# Patient Record
Sex: Female | Born: 1995 | Race: Black or African American | Hispanic: No | Marital: Single
Health system: Southern US, Community
[De-identification: ages and names within clinical notes are randomized; demographics above are authoritative.]

## PROBLEM LIST (undated history)

## (undated) ENCOUNTER — Inpatient Hospital Stay (HOSPITAL_COMMUNITY): Payer: Self-pay

## (undated) DIAGNOSIS — F32A Depression, unspecified: Secondary | ICD-10-CM

## (undated) DIAGNOSIS — F329 Major depressive disorder, single episode, unspecified: Secondary | ICD-10-CM

## (undated) DIAGNOSIS — O09299 Supervision of pregnancy with other poor reproductive or obstetric history, unspecified trimester: Secondary | ICD-10-CM

## (undated) DIAGNOSIS — F419 Anxiety disorder, unspecified: Secondary | ICD-10-CM

## (undated) HISTORY — PX: NO PAST SURGERIES: SHX2092

## (undated) HISTORY — PX: WISDOM TOOTH EXTRACTION: SHX21

---
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
## 2017-01-08 ENCOUNTER — Encounter: Payer: Self-pay | Admitting: Family Medicine

## 2017-01-10 ENCOUNTER — Encounter: Payer: Self-pay | Admitting: Family Medicine

## 2017-01-26 ENCOUNTER — Encounter: Payer: Self-pay | Admitting: Family Medicine

## 2017-01-28 ENCOUNTER — Encounter: Payer: Self-pay | Admitting: Family Medicine

## 2017-02-07 ENCOUNTER — Encounter: Payer: Self-pay | Admitting: Family Medicine

## 2017-02-08 ENCOUNTER — Ambulatory Visit (INDEPENDENT_AMBULATORY_CARE_PROVIDER_SITE_OTHER): Payer: Medicaid Other | Admitting: *Deleted

## 2017-02-08 ENCOUNTER — Encounter: Payer: Self-pay | Admitting: Family Medicine

## 2017-02-08 DIAGNOSIS — R319 Hematuria, unspecified: Secondary | ICD-10-CM

## 2017-02-08 LAB — POCT URINALYSIS DIP (DEVICE)
BILIRUBIN URINE: NEGATIVE
Glucose, UA: NEGATIVE mg/dL
KETONES UR: NEGATIVE mg/dL
Nitrite: NEGATIVE
Protein, ur: 100 mg/dL — AB
Specific Gravity, Urine: >=1.03 (ref 1.005–1.030)
Urobilinogen, UA: 0.2 mg/dL (ref 0.0–1.0)
pH: 6.5 (ref 5.0–8.0)

## 2017-02-08 NOTE — Progress Notes (Signed)
Beth Bray here today to have urine checked for uti. Has been sending/receiving mychart messages with our office. She states she does not have pain or burning with urination. States she just notices spots of blood when she wipes. We discussed since she got her first dose of depo-provera in April - that can cause spotting/ irregular bleeding that may improve after her 2nd or 3rd shot.  We discussed if her bleeding does not get better after 2nd or 3rd shot may call us back. We also discussed uti would not cause her to have vaginal bleeding- she denies seeing blood in urine. We discussed I will send her urine for culture since it did show leukocytes, blood, and protein. We will call us next week if she has a uti and tell her what to take. Will not prescribe meds since she does not have symptoms .

## 2017-02-09 ENCOUNTER — Encounter: Payer: Self-pay | Admitting: Family Medicine

## 2017-02-10 ENCOUNTER — Encounter: Payer: Self-pay | Admitting: Family Medicine

## 2017-02-10 ENCOUNTER — Encounter (HOSPITAL_COMMUNITY): Payer: Self-pay | Admitting: Oncology

## 2017-02-10 ENCOUNTER — Emergency Department (HOSPITAL_COMMUNITY)
Admission: EM | Admit: 2017-02-10 | Discharge: 2017-02-11 | Disposition: A | Discharge status: Home / Self Care | Payer: Medicaid Other | Attending: Emergency Medicine | Admitting: Emergency Medicine

## 2017-02-10 DIAGNOSIS — N10 Acute tubulo-interstitial nephritis: Secondary | ICD-10-CM | POA: Insufficient documentation

## 2017-02-10 DIAGNOSIS — R102 Pelvic and perineal pain: Secondary | ICD-10-CM | POA: Diagnosis present

## 2017-02-10 DIAGNOSIS — Z7722 Contact with and (suspected) exposure to environmental tobacco smoke (acute) (chronic): Secondary | ICD-10-CM | POA: Insufficient documentation

## 2017-02-10 DIAGNOSIS — J45909 Unspecified asthma, uncomplicated: Secondary | ICD-10-CM | POA: Insufficient documentation

## 2017-02-10 DIAGNOSIS — N12 Tubulo-interstitial nephritis, not specified as acute or chronic: Secondary | ICD-10-CM

## 2017-02-10 LAB — URINALYSIS, ROUTINE W REFLEX MICROSCOPIC
BILIRUBIN URINE: NEGATIVE
GLUCOSE, UA: NEGATIVE mg/dL
KETONES UR: NEGATIVE mg/dL
Nitrite: NEGATIVE
PROTEIN: 100 mg/dL — AB
Specific Gravity, Urine: 1.02 (ref 1.005–1.030)
pH: 5 (ref 5.0–8.0)

## 2017-02-10 LAB — CBC
HEMATOCRIT: 38.1 % (ref 36.0–46.0)
Hemoglobin: 12.1 g/dL (ref 12.0–15.0)
MCH: 25.1 pg — AB (ref 26.0–34.0)
MCHC: 31.8 g/dL (ref 30.0–36.0)
MCV: 79 fL (ref 78.0–100.0)
PLATELETS: 277 10*3/uL (ref 150–400)
RBC: 4.82 MIL/uL (ref 3.87–5.11)
RDW: 15 % (ref 11.5–15.5)
WBC: 8.4 10*3/uL (ref 4.0–10.5)

## 2017-02-10 LAB — COMPREHENSIVE METABOLIC PANEL
ALT: 15 U/L (ref 14–54)
AST: 16 U/L (ref 15–41)
Albumin: 4.7 g/dL (ref 3.5–5.0)
Alkaline Phosphatase: 56 U/L (ref 38–126)
Anion gap: 7 (ref 5–15)
BUN: 10 mg/dL (ref 6–20)
CHLORIDE: 108 mmol/L (ref 101–111)
CO2: 24 mmol/L (ref 22–32)
CREATININE: 1.03 mg/dL — AB (ref 0.44–1.00)
Calcium: 9.4 mg/dL (ref 8.9–10.3)
GFR calc non Af Amer: >60 mL/min (ref >60)
Glucose, Bld: 85 mg/dL (ref 65–99)
POTASSIUM: 3.9 mmol/L (ref 3.5–5.1)
SODIUM: 139 mmol/L (ref 135–145)
Total Bilirubin: 0.5 mg/dL (ref 0.3–1.2)
Total Protein: 8.2 g/dL — ABNORMAL HIGH (ref 6.5–8.1)

## 2017-02-10 LAB — LIPASE, BLOOD: LIPASE: 32 U/L (ref 11–51)

## 2017-02-10 LAB — URINE CULTURE

## 2017-02-10 LAB — POC URINE PREG, ED: PREG TEST UR: NEGATIVE

## 2017-02-10 MED ORDER — SODIUM CHLORIDE 0.9 % IV BOLUS (SEPSIS)
1000.0000 mL | Freq: Once | INTRAVENOUS | Status: AC
  Administered 2017-02-10: 1000 mL via INTRAVENOUS

## 2017-02-10 MED ORDER — ONDANSETRON HCL 4 MG/2ML IJ SOLN
4.0000 mg | Freq: Once | INTRAMUSCULAR | Status: AC
  Administered 2017-02-10: 4 mg via INTRAVENOUS
  Filled 2017-02-10: qty 2

## 2017-02-10 MED ORDER — FENTANYL CITRATE (PF) 100 MCG/2ML IJ SOLN
50.0000 ug | INTRAMUSCULAR | Status: DC | PRN
  Administered 2017-02-10: 50 ug via INTRAVENOUS
  Filled 2017-02-10: qty 2

## 2017-02-10 NOTE — ED Triage Notes (Signed)
Pt c/o lower abdominal pain since Tuesday.  Denies pain w/ urination however states that she has seen blood in her urine.  [facility] and worked up for STD. Pt rates pain 10/10, throbbing in nature.

## 2017-02-10 NOTE — ED Notes (Signed)
Bed: WA07 Expected date:  Expected time:  Means of arrival:  Comments: 

## 2017-02-11 ENCOUNTER — Emergency Department (HOSPITAL_COMMUNITY): Payer: Medicaid Other

## 2017-02-11 LAB — WET PREP, GENITAL
Clue Cells Wet Prep HPF POC: NONE SEEN
Sperm: NONE SEEN
Trich, Wet Prep: NONE SEEN
Yeast Wet Prep HPF POC: NONE SEEN

## 2017-02-11 LAB — RPR: RPR Ser Ql: NONREACTIVE

## 2017-02-11 LAB — HIV ANTIBODY (ROUTINE TESTING W REFLEX): HIV Screen 4th Generation wRfx: NONREACTIVE

## 2017-02-11 MED ORDER — CEPHALEXIN 500 MG PO CAPS: 500.0000 mg | ORAL_CAPSULE | Freq: Four times a day (QID) | ORAL | 0 refills | Status: DC

## 2017-02-11 MED ORDER — DEXTROSE 5 % IV SOLN
1.0000 g | Freq: Once | INTRAVENOUS | Status: AC
  Administered 2017-02-11: 1 g via INTRAVENOUS
  Filled 2017-02-11: qty 10

## 2017-02-11 MED ORDER — SODIUM CHLORIDE 0.9 % IV BOLUS (SEPSIS)
500.0000 mL | Freq: Once | INTRAVENOUS | Status: AC
  Administered 2017-02-11: 500 mL via INTRAVENOUS

## 2017-02-11 NOTE — Discharge Instructions (Signed)
Get plenty of rest, and drink a lot of fluids.  Use Tylenol, for pain or fever.  Follow-up with your doctor, or return here, if needed for problems.

## 2017-02-11 NOTE — ED Provider Notes (Signed)
[facility]-EMERGENCY DEPT Provider Note   CSN: [id number] Arrival date & time: 02/10/17  1858  By signing my name below, I, Deland Pretty, attest that this documentation has been prepared under the direction and in the presence of Mancel Bale, MD. Electronically Signed: Deland Pretty, ED Scribe. 02/11/17. 12:31 AM.  History   Chief Complaint Chief Complaint  Patient presents with  . Pelvic Pain   The history is provided by the patient and a parent. No language interpreter was used.    HPI Comments: Beth Bray is a 21 y.o. female who presents to the Emergency Department complaining of severe, gradually worsening lower abdominal pelvic pain, weakness and dizziness, that began months ago, per mother. Mother reports that the pt recently went to the [facility] where she was given a urine test where the results would be finalized the next week. Mother reports that the pt has recently started the Depo Dwana Curd shot that began in April 2018 as a form of birth control. NKDA. Pt denies vomiting, coughing and chest pain.   Past Medical History:  Diagnosis Date  . Asthma   . Depression     Patient Active Problem List   Diagnosis Date Noted  . DUB (dysfunctional uterine bleeding) 08/12/2015    Past Surgical History:  Procedure Laterality Date  . NO PAST SURGERIES      OB History    Gravida Para Term Preterm AB Living   0 0 0 0 0 0   SAB TAB Ectopic Multiple Live Births   0 0 0 0         Home Medications    Prior to Admission medications   Medication Sig Start Date End Date Taking? Authorizing Provider  albuterol (PROVENTIL HFA;VENTOLIN HFA) 108 (90 BASE) MCG/ACT inhaler Inhale 3 puffs into the lungs every 4 (four) hours as needed for wheezing. 06/05/12   Marcellina Millin, MD  cephALEXin (KEFLEX) 500 MG capsule Take 1 capsule (500 mg total) by mouth 4 (four) times daily. 02/11/17   Mancel Bale, MD  ibuprofen (ADVIL,MOTRIN) 800 MG tablet Take 1 tablet (800 mg total) by mouth  3 (three) times daily. 07/25/16   Katrinka Blazing, [place], CNM  norgestimate-ethinyl estradiol (ORTHO-CYCLEN,SPRINTEC,PREVIFEM) 0.25-35 MG-MCG tablet Take 1 tablet by mouth daily. Patient not taking: Reported on 12/28/2016 11/10/16   Constant, Peggy, MD    Family History No family history on file.  Social History Social History  Substance Use Topics  . Smoking status: Passive Smoke Exposure - Never Smoker  . Smokeless tobacco: Never Used  . Alcohol use No     Allergies   Pineapple   Review of Systems Review of Systems  Respiratory: Negative for cough.   Cardiovascular: Negative for chest pain.  Gastrointestinal: Positive for abdominal pain. Negative for vomiting.  Genitourinary: Positive for pelvic pain and vaginal bleeding.  All other systems reviewed and are negative.    Physical Exam Updated Vital Signs BP (!) 92/58 (BP Location: Left Arm)   Pulse 78   Temp 98.6 F (37 C) (Oral)   Resp 18   Ht 5\' 3"  (1.6 m)   Wt 56.2 kg (124 lb)   SpO2 100%   BMI 21.97 kg/m   Physical Exam  Constitutional: She is oriented to person, place, and time. She appears well-developed and well-nourished.  HENT:  Head: Normocephalic and atraumatic.  Eyes: Conjunctivae and EOM are normal. Pupils are equal, round, and reactive to light.  Neck: Normal range of motion and phonation normal. Neck supple.  Cardiovascular:  Normal rate and regular rhythm.   Pulmonary/Chest: Effort normal and breath sounds normal. She exhibits no tenderness.  Abdominal: Soft. She exhibits no distension. There is no tenderness. There is no guarding.  Moderate diffuse abdominal tenderness  Genitourinary:  Genitourinary Comments: Left costovertebral angle tenderness with percussion. NEFG. Small amount of white vaginal d/c. Tender mid-line and adenexal areas, guarding, without mass.   Musculoskeletal: Normal range of motion.  Neurological: She is alert and oriented to person, place, and time. She exhibits normal muscle tone.   Skin: Skin is warm and dry.  Psychiatric: She has a normal mood and affect. Her behavior is normal. Judgment and thought content normal.  Nursing note and vitals reviewed.    ED Treatments / Results   DIAGNOSTIC STUDIES: Oxygen Saturation is 100% on RA, normal by my interpretation.   COORDINATION OF CARE: 12:28 AM-Discussed next steps with pt including pelvic US. Pt verbalized understanding and is agreeable with the plan.   Labs (all labs ordered are listed, but only abnormal results are displayed) Labs Reviewed  WET PREP, GENITAL - Abnormal; Notable for the following:       Result Value   WBC, Wet Prep HPF POC RARE (*)    All other components within normal limits  COMPREHENSIVE METABOLIC PANEL - Abnormal; Notable for the following:    Creatinine, Ser 1.03 (*)    Total Protein 8.2 (*)    All other components within normal limits  CBC - Abnormal; Notable for the following:    MCH 25.1 (*)    All other components within normal limits  URINALYSIS, ROUTINE W REFLEX MICROSCOPIC - Abnormal; Notable for the following:    APPearance CLOUDY (*)    Hgb urine dipstick LARGE (*)    Protein, ur 100 (*)    Leukocytes, UA LARGE (*)    Bacteria, UA RARE (*)    Squamous Epithelial / LPF 0-5 (*)    Non Squamous Epithelial 0-5 (*)    All other components within normal limits  LIPASE, BLOOD  RPR  HIV ANTIBODY (ROUTINE TESTING)  POC URINE PREG, ED  GC/CHLAMYDIA PROBE AMP ([place]) NOT AT [facility]    EKG  EKG Interpretation None       Radiology US Transvaginal Non-ob  Result Date: 02/11/2017 CLINICAL DATA:  Pelvic pain for 2 weeks EXAM: TRANSABDOMINAL AND TRANSVAGINAL ULTRASOUND OF PELVIS TECHNIQUE: Both transabdominal and transvaginal ultrasound examinations of the pelvis were performed. Transabdominal technique was performed for global imaging of the pelvis including uterus, ovaries, adnexal regions, and pelvic cul-de-sac. It was necessary to proceed with endovaginal exam  following the transabdominal exam to visualize the endometrium and ovaries. COMPARISON:  None FINDINGS: Uterus Measurements: 7.2 x 3.5 x 4.7 cm. No fibroids or other mass visualized. Endometrium Thickness: 5.2 mm. Heterogenous echotexture with tiny cystic spaces. Right ovary Measurements: 2.8 x 1.8 x 2.2 cm. Normal appearance/no adnexal mass. Left ovary Measurements: 2.5 x 1.9 x 2.2 cm. Normal appearance/no adnexal mass. Other findings No abnormal free fluid. IMPRESSION: Slightly heterogeneous appearance of the endometrium but normal thickness and no focal mass. Essentially negative pelvic ultrasound. Electronically Signed   By: Jasmine Pang M.D.   On: 02/11/2017 02:25   US Pelvis Complete  Result Date: 02/11/2017 CLINICAL DATA:  Pelvic pain for 2 weeks EXAM: TRANSABDOMINAL AND TRANSVAGINAL ULTRASOUND OF PELVIS TECHNIQUE: Both transabdominal and transvaginal ultrasound examinations of the pelvis were performed. Transabdominal technique was performed for global imaging of the pelvis including uterus, ovaries, adnexal regions, and pelvic  cul-de-sac. It was necessary to proceed with endovaginal exam following the transabdominal exam to visualize the endometrium and ovaries. COMPARISON:  None FINDINGS: Uterus Measurements: 7.2 x 3.5 x 4.7 cm. No fibroids or other mass visualized. Endometrium Thickness: 5.2 mm. Heterogenous echotexture with tiny cystic spaces. Right ovary Measurements: 2.8 x 1.8 x 2.2 cm. Normal appearance/no adnexal mass. Left ovary Measurements: 2.5 x 1.9 x 2.2 cm. Normal appearance/no adnexal mass. Other findings No abnormal free fluid. IMPRESSION: Slightly heterogeneous appearance of the endometrium but normal thickness and no focal mass. Essentially negative pelvic ultrasound. Electronically Signed   By: Jasmine PangKim  Fujinaga M.D.   On: 02/11/2017 02:25    Procedures Procedures (including critical care time)  Medications Ordered in ED Medications  fentaNYL (SUBLIMAZE) injection 50 mcg (50 mcg  Intravenous Given 02/10/17 2309)  ondansetron (ZOFRAN) injection 4 mg (4 mg Intravenous Given 02/10/17 2309)  sodium chloride 0.9 % bolus 1,000 mL (0 mLs Intravenous Stopped 02/10/17 2338)  sodium chloride 0.9 % bolus 500 mL (500 mLs Intravenous New Bag/Given 02/11/17 0112)  cefTRIAXone (ROCEPHIN) 1 g in dextrose 5 % 50 mL IVPB (1 g Intravenous New Bag/Given 02/11/17 0112)     Initial Impression / Assessment and Plan / ED Course  I have reviewed the triage vital signs and the nursing notes.  Pertinent labs & imaging results that were available during my care of the patient were reviewed by me and considered in my medical decision making (see chart for details).  Clinical Course as of Feb 11 245  Wynelle LinkSun Feb 11, 2017  0043 Lipase: 32 [EW]    Clinical Course User Index [EW] Mancel BaleWentz, Skylah Delauter, MD     Patient Vitals for the past 24 hrs:  BP Temp Temp src Pulse Resp SpO2 Height Weight  02/11/17 0017 (!) 92/58 - - 78 18 100 % - -  02/10/17 2212 119/70 98.6 F (37 C) Oral 86 20 100 % - -  02/10/17 1914 109/67 98.3 F (36.8 C) Oral 100 20 100 % 5\' 3"  (1.6 m) 56.2 kg (124 lb)    2:42 AM Reevaluation with update and discussion. After initial assessment and treatment, an updated evaluation reveals she remains comfortable has no further complaints.  Findings discussed with patient and mother, all questions answered. Braden Cimo L    Final Clinical Impressions(s) / ED Diagnoses   Final diagnoses:  Pyelonephritis    Pyelonephritis, uncomplicated, with nonspecific abdominal discomfort.  Screening pelvic examination, and pelvic ultrasound were unrevealing for pathology.  Doubt serious bacterial infection, metabolic instability or impending vascular collapse.  Nursing Notes Reviewed/ Care Coordinated Applicable Imaging Reviewed Interpretation of Laboratory Data incorporated into ED treatment  The patient appears reasonably screened and/or stabilized for discharge and I doubt any other medical  condition or other [facility] requiring further screening, evaluation, or treatment in the ED at this time prior to discharge.  Plan: Home Medications- APAP prn; Home Treatments- rest, fluids; return here if the recommended treatment, does not improve the symptoms; Recommended follow up- PCP prn   New Prescriptions New Prescriptions   CEPHALEXIN (KEFLEX) 500 MG CAPSULE    Take 1 capsule (500 mg total) by mouth 4 (four) times daily.   I personally performed the services described in this documentation, which was scribed in my presence. The recorded information has been reviewed and is accurate. ]    Mancel BaleWentz, Shawon Denzer, MD 02/11/17 [phone]

## 2017-02-12 LAB — GC/CHLAMYDIA PROBE AMP (~~LOC~~) NOT AT ARMC
Chlamydia: NEGATIVE
Neisseria Gonorrhea: NEGATIVE

## 2017-02-15 ENCOUNTER — Encounter: Payer: Self-pay | Admitting: Family Medicine

## 2017-02-16 ENCOUNTER — Encounter: Payer: Self-pay | Admitting: Family Medicine

## 2017-02-20 ENCOUNTER — Encounter: Payer: Self-pay | Admitting: Family Medicine

## 2017-02-21 ENCOUNTER — Encounter: Payer: Self-pay | Admitting: Family Medicine

## 2017-02-22 ENCOUNTER — Encounter: Payer: Self-pay | Admitting: Family Medicine

## 2017-02-23 ENCOUNTER — Encounter: Payer: Self-pay | Admitting: Family Medicine

## 2017-02-25 ENCOUNTER — Encounter: Payer: Self-pay | Admitting: Family Medicine

## 2017-02-26 ENCOUNTER — Encounter: Payer: Self-pay | Admitting: Family Medicine

## 2017-02-27 ENCOUNTER — Encounter: Payer: Self-pay | Admitting: Family Medicine

## 2017-02-28 ENCOUNTER — Encounter: Payer: Self-pay | Admitting: Family Medicine

## 2017-02-28 ENCOUNTER — Encounter (HOSPITAL_COMMUNITY): Payer: Self-pay

## 2017-02-28 ENCOUNTER — Inpatient Hospital Stay (HOSPITAL_COMMUNITY)
Admission: AD | Admit: 2017-02-28 | Discharge: 2017-02-28 | Disposition: A | Discharge status: Home / Self Care | Payer: Medicaid Other | Source: Ambulatory Visit | Attending: Family Medicine | Admitting: Family Medicine

## 2017-02-28 DIAGNOSIS — Z888 Allergy status to other drugs, medicaments and biological substances status: Secondary | ICD-10-CM | POA: Insufficient documentation

## 2017-02-28 DIAGNOSIS — F329 Major depressive disorder, single episode, unspecified: Secondary | ICD-10-CM | POA: Diagnosis not present

## 2017-02-28 DIAGNOSIS — J45909 Unspecified asthma, uncomplicated: Secondary | ICD-10-CM | POA: Diagnosis not present

## 2017-02-28 DIAGNOSIS — Z79899 Other long term (current) drug therapy: Secondary | ICD-10-CM | POA: Diagnosis not present

## 2017-02-28 DIAGNOSIS — N938 Other specified abnormal uterine and vaginal bleeding: Secondary | ICD-10-CM

## 2017-02-28 DIAGNOSIS — R109 Unspecified abdominal pain: Secondary | ICD-10-CM | POA: Diagnosis not present

## 2017-02-28 LAB — URINALYSIS, ROUTINE W REFLEX MICROSCOPIC
Bacteria, UA: NONE SEEN
Bilirubin Urine: NEGATIVE
GLUCOSE, UA: NEGATIVE mg/dL
Ketones, ur: 5 mg/dL — AB
Leukocytes, UA: NEGATIVE
NITRITE: NEGATIVE
Protein, ur: 30 mg/dL — AB
Specific Gravity, Urine: 1.021 (ref 1.005–1.030)
pH: 5 (ref 5.0–8.0)

## 2017-02-28 LAB — WET PREP, GENITAL
Clue Cells Wet Prep HPF POC: NONE SEEN
SPERM: NONE SEEN
TRICH WET PREP: NONE SEEN
Yeast Wet Prep HPF POC: NONE SEEN

## 2017-02-28 LAB — CBC
HEMATOCRIT: 36.4 % (ref 36.0–46.0)
HEMOGLOBIN: 11.7 g/dL — AB (ref 12.0–15.0)
MCH: 25.2 pg — ABNORMAL LOW (ref 26.0–34.0)
MCHC: 32.1 g/dL (ref 30.0–36.0)
MCV: 78.3 fL (ref 78.0–100.0)
Platelets: 323 10*3/uL (ref 150–400)
RBC: 4.65 MIL/uL (ref 3.87–5.11)
RDW: 15.1 % (ref 11.5–15.5)
WBC: 8.5 10*3/uL (ref 4.0–10.5)

## 2017-02-28 LAB — POCT PREGNANCY, URINE: PREG TEST UR: NEGATIVE

## 2017-02-28 NOTE — MAU Note (Signed)
Pt states she had UTI the beginning of June, was bleeding then, is still bleeding now.  Had first depo shot in April.  Has intermittent abdominal pain, not every day.  Has changed two pads this morning.  Has had two diarrhea stools today.

## 2017-02-28 NOTE — MAU Provider Note (Signed)
History     CSN: [id number]  Arrival date and time: 02/28/17 [id number]   First Provider Initiated Contact with Patient 02/28/17 1012      Chief Complaint  Patient presents with  . Vaginal Bleeding  . Abdominal Pain   HPI Beth Bray is a 21 y.o. G0P0000 non pregnant female who presents stating she has been bleeding for 3 weeks. She states she started her period on 02/10/17 and has continued to bleed every day since then. She reports some abdominal cramping with the bleeding that she rates a 5/10 and has not tried anything for the pain. She received her first Depo injection in April and was given birth control pills to help with the bleeding but only took them for one week because they weren't helping.   OB History    Gravida Para Term Preterm AB Living   0 0 0 0 0 0   SAB TAB Ectopic Multiple Live Births   0 0 0 0        Past Medical History:  Diagnosis Date  . Asthma   . Depression     Past Surgical History:  Procedure Laterality Date  . NO PAST SURGERIES      History reviewed. No pertinent family history.  Social History  Substance Use Topics  . Smoking status: Passive Smoke Exposure - Never Smoker  . Smokeless tobacco: Never Used  . Alcohol use No    Allergies:  Allergies  Allergen Reactions  . Pineapple     Bumps and sores in mouth    Prescriptions Prior to Admission  Medication Sig Dispense Refill Last Dose  . ibuprofen (ADVIL,MOTRIN) 800 MG tablet Take 1 tablet (800 mg total) by mouth 3 (three) times daily. 30 tablet 1 Past Week at Unknown time  . norgestimate-ethinyl estradiol (ORTHO-CYCLEN,SPRINTEC,PREVIFEM) 0.25-35 MG-MCG tablet Take 1 tablet by mouth daily. 1 Package 11 02/27/2017 at Unknown time  . albuterol (PROVENTIL HFA;VENTOLIN HFA) 108 (90 BASE) MCG/ACT inhaler Inhale 3 puffs into the lungs every 4 (four) hours as needed for wheezing. 1 Inhaler 0 rescue    Review of Systems  Constitutional: Negative.  Negative for chills, fatigue and fever.   Respiratory: Negative.  Negative for shortness of breath.   Cardiovascular: Negative.  Negative for chest pain.  Gastrointestinal: Positive for abdominal pain.  Genitourinary: Positive for vaginal bleeding. Negative for dysuria and vaginal discharge.  Neurological: Negative.  Negative for dizziness, light-headedness and headaches.   Physical Exam   Blood pressure 116/68, pulse (!) 116, temperature 98.9 F (37.2 C), temperature source Oral, resp. rate 16, height 5\' 3"  (1.6 m), weight 126 lb (57.2 kg), last menstrual period 02/10/2017.  Physical Exam  Nursing note and vitals reviewed. Constitutional: She is oriented to person, place, and time. She appears well-developed and well-nourished.  HENT:  Head: Normocephalic and atraumatic.  Eyes: Conjunctivae are normal. No scleral icterus.  Cardiovascular: Normal rate, regular rhythm and normal heart sounds.   Respiratory: Effort normal and breath sounds normal. No respiratory distress.  GI: Soft. She exhibits no distension. There is no tenderness.  Genitourinary: There is bleeding in the vagina.  Genitourinary Comments: Minimal amount of dark red bleeding in vagina  Neurological: She is alert and oriented to person, place, and time.  Skin: Skin is warm and dry.  Psychiatric: She has a normal mood and affect. Her behavior is normal. Judgment and thought content normal.   Pelvic exam: Cervix pink, visually closed, without lesion, scant white creamy discharge with a small  amount of dark red blood, vaginal walls and external genitalia normal Bimanual exam: Cervix 0/long/high, firm, anterior, neg CMT, uterus nontender, nonenlarged, adnexa without tenderness, enlargement, or mass  MAU Course  Procedures Results for orders placed or performed during the hospital encounter of 02/28/17 (from the past 24 hour(s))  Urinalysis, Routine w reflex microscopic     Status: Abnormal   Collection Time: 02/28/17  9:56 AM  Result Value Ref Range   Color,  Urine YELLOW YELLOW   APPearance CLEAR CLEAR   Specific Gravity, Urine 1.021 1.005 - 1.030   pH 5.0 5.0 - 8.0   Glucose, UA NEGATIVE NEGATIVE mg/dL   Hgb urine dipstick LARGE (A) NEGATIVE   Bilirubin Urine NEGATIVE NEGATIVE   Ketones, ur 5 (A) NEGATIVE mg/dL   Protein, ur 30 (A) NEGATIVE mg/dL   Nitrite NEGATIVE NEGATIVE   Leukocytes, UA NEGATIVE NEGATIVE   RBC / HPF TOO NUMEROUS TO COUNT 0 - 5 RBC/hpf   WBC, UA 0-5 0 - 5 WBC/hpf   Bacteria, UA NONE SEEN NONE SEEN   Squamous Epithelial / LPF 0-5 (A) NONE SEEN   Mucous PRESENT   Pregnancy, urine POC     Status: None   Collection Time: 02/28/17 10:06 AM  Result Value Ref Range   Preg Test, Ur NEGATIVE NEGATIVE  CBC     Status: Abnormal   Collection Time: 02/28/17 10:13 AM  Result Value Ref Range   WBC 8.5 4.0 - 10.5 K/uL   RBC 4.65 3.87 - 5.11 MIL/uL   Hemoglobin 11.7 (L) 12.0 - 15.0 g/dL   HCT 91.4 78.2 - 95.6 %   MCV 78.3 78.0 - 100.0 fL   MCH 25.2 (L) 26.0 - 34.0 pg   MCHC 32.1 30.0 - 36.0 g/dL   RDW 21.3 08.6 - 57.8 %   Platelets 323 150 - 400 K/uL  Wet prep, genital     Status: Abnormal   Collection Time: 02/28/17 10:25 AM  Result Value Ref Range   Yeast Wet Prep HPF POC NONE SEEN NONE SEEN   Trich, Wet Prep NONE SEEN NONE SEEN   Clue Cells Wet Prep HPF POC NONE SEEN NONE SEEN   WBC, Wet Prep HPF POC FEW (A) NONE SEEN   Sperm NONE SEEN    MDM UA, UPT CBC Wet prep and gc/chlamydia Patient declines pain medication  Assessment and Plan   1. DUB (dysfunctional uterine bleeding)    -Discharge patient home in stable condition. -Restart ortho-cyclen, taking 2 pills per day for 3 days and then daily.  -Ibuprofen for pain management. -Follow up with [facility] for routine gynecological care -Encouraged to return here or to other Urgent Care/ED if she develops worsening of symptoms, increase in pain, fever, or other concerning symptoms.   Cleone Slim SNM 02/28/2017, 10:48 AM    Follow-up Information    [facility]. Schedule an appointment as soon as possible for a visit.   Contact information: [address] [place] [address] [phone]

## 2017-02-28 NOTE — Discharge Instructions (Signed)

## 2017-03-01 ENCOUNTER — Encounter: Payer: Self-pay | Admitting: Family Medicine

## 2017-03-01 LAB — GC/CHLAMYDIA PROBE AMP (~~LOC~~) NOT AT ARMC
Chlamydia: NEGATIVE
NEISSERIA GONORRHEA: NEGATIVE

## 2017-03-10 ENCOUNTER — Encounter: Payer: Self-pay | Admitting: Family Medicine

## 2017-03-12 ENCOUNTER — Encounter: Payer: Self-pay | Admitting: Family Medicine

## 2017-03-12 ENCOUNTER — Telehealth: Payer: Self-pay | Admitting: General Practice

## 2017-03-12 DIAGNOSIS — N938 Other specified abnormal uterine and vaginal bleeding: Secondary | ICD-10-CM

## 2017-03-12 MED ORDER — MEDROXYPROGESTERONE ACETATE 10 MG PO TABS: 20.0000 mg | ORAL_TABLET | Freq: Every day | ORAL | 1 refills | Status: DC

## 2017-03-12 NOTE — Telephone Encounter (Signed)
Telephone call to patient regarding mychart message. Asked patient when the last time she took birth control pills were to help with her bleeding. Patient states she hasn't taken any since the 20th because she felt it made the bleeding worse. Per Dr Adrian BlackwaterStinson, patient should take provera 20mg  daily until she comes in for a follow up appt. Informed patient of medication and recommended follow up. Told patient someone from the front office will contact her with that appt. Patient verbalized understanding & had no questions

## 2017-03-13 ENCOUNTER — Encounter: Payer: Self-pay | Admitting: Family Medicine

## 2017-03-14 ENCOUNTER — Encounter: Payer: Self-pay | Admitting: Family Medicine

## 2017-03-15 ENCOUNTER — Encounter: Payer: Self-pay | Admitting: Family Medicine

## 2017-03-15 ENCOUNTER — Telehealth: Payer: Self-pay | Admitting: General Practice

## 2017-03-15 ENCOUNTER — Ambulatory Visit: Payer: Medicaid Other

## 2017-03-15 ENCOUNTER — Ambulatory Visit: Payer: Medicaid Other | Admitting: Obstetrics & Gynecology

## 2017-03-15 NOTE — Telephone Encounter (Signed)
Called patient regarding mychart message and offered 3pm appt today in MAU/urgent work in slot. Patient verbalized understanding & states she will come. Patient had no questions

## 2017-03-16 ENCOUNTER — Encounter: Payer: Self-pay | Admitting: Family Medicine

## 2017-03-18 ENCOUNTER — Encounter: Payer: Self-pay | Admitting: Family Medicine

## 2017-03-19 ENCOUNTER — Encounter: Payer: Self-pay | Admitting: Family Medicine

## 2017-03-20 ENCOUNTER — Encounter: Payer: Self-pay | Admitting: Obstetrics & Gynecology

## 2017-03-20 ENCOUNTER — Ambulatory Visit (INDEPENDENT_AMBULATORY_CARE_PROVIDER_SITE_OTHER): Payer: Medicaid Other | Admitting: Obstetrics & Gynecology

## 2017-03-20 VITALS — BP 109/58 | HR 78 | Ht 63.0 in | Wt 131.4 lb

## 2017-03-20 DIAGNOSIS — N939 Abnormal uterine and vaginal bleeding, unspecified: Secondary | ICD-10-CM

## 2017-03-20 MED ORDER — DICLOFENAC POTASSIUM 50 MG PO TABS: 50.0000 mg | ORAL_TABLET | Freq: Three times a day (TID) | ORAL | 1 refills | Status: DC

## 2017-03-20 MED ORDER — HYDROCODONE-ACETAMINOPHEN 5-300 MG PO TABS: 1.0000 | ORAL_TABLET | Freq: Three times a day (TID) | ORAL | 0 refills | Status: DC | PRN

## 2017-03-20 MED ORDER — NORETHINDRONE ACET-ETHINYL EST 1.5-30 MG-MCG PO TABS: 1.0000 | ORAL_TABLET | Freq: Every day | ORAL | 11 refills | Status: DC

## 2017-03-20 NOTE — Progress Notes (Signed)
History:  21 y.o. G0P0000 here today for AUB. Pt reports that she was started on Depo Provera for dysmenorrhea but, she has had heavy bleeding with clots since that time.  She does not feel that she was warned about the risk of bleeding . She and her mother are very frustrated with the amount of bleeding. She was tried on Provera and Megace which made the bleeding even worse.      The following portions of the patient's history were reviewed and updated as appropriate: allergies, current medications, past family history, past medical history, past social history, past surgical history and problem list.  Review of Systems:  Pertinent items are noted in HPI.   Objective:  Physical Exam Blood pressure (!) 109/58, pulse 78, height 5\' 3"  (1.6 m), weight 131 lb 6.4 oz (59.6 kg), last menstrual period 02/10/2017. CONSTITUTIONAL: Well-developed, well-nourished female in no acute distress.  HENT:  Normocephalic, atraumatic EYES: Conjunctivae and EOM are normal. No scleral icterus.  NECK: Normal range of motion SKIN: Skin is warm and dry. No rash noted. Not diaphoretic.No pallor. NEUROLGIC: Alert and oriented to person, place, and time. Normal coordination.  GYN: deferred  Assessment & Plan:  AUB assoc with dysmenorrhea. I suspect atrophic endometrium from all of the progestins.    LoEstrin 1.5/30 1 po q day Cataflam 1 po tid prn pain with  Vicodin 5/300 1 po tid prn #16 CBC today rec LnIUD after 2-3 months on OCPs  Total face-to-face time with patient was 20 min.  Greater than 50% was spent in counseling and coordination of care with the patient.    Joshalyn Ancheta L. Harraway-Smith, M.D., Evern CoreFACOG

## 2017-03-21 ENCOUNTER — Encounter: Payer: Self-pay | Admitting: Family Medicine

## 2017-03-21 LAB — CBC WITH DIFFERENTIAL/PLATELET
BASOS ABS: 0 10*3/uL (ref 0.0–0.2)
Basos: 0 %
EOS (ABSOLUTE): 0.3 10*3/uL (ref 0.0–0.4)
Eos: 4 %
HEMOGLOBIN: 10.6 g/dL — AB (ref 11.1–15.9)
Hematocrit: 31.6 % — ABNORMAL LOW (ref 34.0–46.6)
IMMATURE GRANS (ABS): 0 10*3/uL (ref 0.0–0.1)
Immature Granulocytes: 0 %
LYMPHS: 50 %
Lymphocytes Absolute: 3.9 10*3/uL — ABNORMAL HIGH (ref 0.7–3.1)
MCH: 26 pg — ABNORMAL LOW (ref 26.6–33.0)
MCHC: 33.5 g/dL (ref 31.5–35.7)
MCV: 78 fL — ABNORMAL LOW (ref 79–97)
Monocytes Absolute: 0.7 10*3/uL (ref 0.1–0.9)
Monocytes: 9 %
NEUTROS PCT: 37 %
Neutrophils Absolute: 2.9 10*3/uL (ref 1.4–7.0)
PLATELETS: 324 10*3/uL (ref 150–379)
RBC: 4.08 x10E6/uL (ref 3.77–5.28)
RDW: 16.1 % — ABNORMAL HIGH (ref 12.3–15.4)
WBC: 7.8 10*3/uL (ref 3.4–10.8)

## 2017-04-02 ENCOUNTER — Ambulatory Visit: Payer: Medicaid Other | Admitting: Family Medicine

## 2017-04-03 ENCOUNTER — Other Ambulatory Visit: Payer: Self-pay | Admitting: General Practice

## 2017-04-03 ENCOUNTER — Encounter: Payer: Self-pay | Admitting: Family Medicine

## 2017-04-03 ENCOUNTER — Encounter: Payer: Self-pay | Admitting: General Practice

## 2017-04-03 DIAGNOSIS — D509 Iron deficiency anemia, unspecified: Secondary | ICD-10-CM

## 2017-04-03 MED ORDER — FERROUS SULFATE 325 (65 FE) MG PO TABS: 325.0000 mg | ORAL_TABLET | Freq: Every day | ORAL | 3 refills | Status: DC

## 2017-04-23 ENCOUNTER — Encounter: Payer: Self-pay | Admitting: Obstetrics and Gynecology

## 2017-05-01 ENCOUNTER — Encounter: Payer: Self-pay | Admitting: Obstetrics and Gynecology

## 2017-05-02 ENCOUNTER — Encounter: Payer: Self-pay | Admitting: Obstetrics and Gynecology

## 2017-05-16 ENCOUNTER — Ambulatory Visit: Payer: Medicaid Other | Admitting: Obstetrics and Gynecology

## 2017-06-03 ENCOUNTER — Encounter: Payer: Self-pay | Admitting: Family Medicine

## 2017-06-04 ENCOUNTER — Encounter: Payer: Self-pay | Admitting: Family Medicine

## 2017-06-07 ENCOUNTER — Encounter: Payer: Self-pay | Admitting: Family Medicine

## 2017-06-14 ENCOUNTER — Encounter: Payer: Self-pay | Admitting: Family Medicine

## 2017-08-14 ENCOUNTER — Ambulatory Visit (HOSPITAL_COMMUNITY)
Admission: EM | Admit: 2017-08-14 | Discharge: 2017-08-14 | Disposition: A | Discharge status: Home / Self Care | Payer: Medicaid Other | Attending: Internal Medicine | Admitting: Internal Medicine

## 2017-08-14 ENCOUNTER — Encounter (HOSPITAL_COMMUNITY): Payer: Self-pay | Admitting: Emergency Medicine

## 2017-08-14 ENCOUNTER — Other Ambulatory Visit: Payer: Self-pay

## 2017-08-14 DIAGNOSIS — R109 Unspecified abdominal pain: Secondary | ICD-10-CM

## 2017-08-14 DIAGNOSIS — K529 Noninfective gastroenteritis and colitis, unspecified: Secondary | ICD-10-CM | POA: Diagnosis not present

## 2017-08-14 DIAGNOSIS — G8929 Other chronic pain: Secondary | ICD-10-CM

## 2017-08-14 MED ORDER — ONDANSETRON 4 MG PO TBDP
ORAL_TABLET | ORAL | Status: AC
  Filled 2017-08-14: qty 2

## 2017-08-14 MED ORDER — ONDANSETRON HCL 4 MG/2ML IJ SOLN
INTRAMUSCULAR | Status: AC
  Filled 2017-08-14: qty 4

## 2017-08-14 MED ORDER — GI COCKTAIL ~~LOC~~
ORAL | Status: AC
  Filled 2017-08-14: qty 30

## 2017-08-14 MED ORDER — GI COCKTAIL ~~LOC~~
30.0000 mL | Freq: Once | ORAL | Status: AC
  Administered 2017-08-14: 30 mL via ORAL

## 2017-08-14 MED ORDER — FAMOTIDINE 40 MG PO TABS: 40.0000 mg | ORAL_TABLET | Freq: Two times a day (BID) | ORAL | 0 refills | Status: DC

## 2017-08-14 MED ORDER — PROMETHAZINE HCL 12.5 MG PO TABS: 12.5000 mg | ORAL_TABLET | Freq: Four times a day (QID) | ORAL | 0 refills | Status: DC | PRN

## 2017-08-14 MED ORDER — ONDANSETRON 4 MG PO TBDP
8.0000 mg | ORAL_TABLET | Freq: Once | ORAL | Status: AC
  Administered 2017-08-14: 8 mg via ORAL

## 2017-08-14 MED ORDER — ONDANSETRON HCL 4 MG/2ML IJ SOLN
8.0000 mg | Freq: Once | INTRAMUSCULAR | Status: AC
  Administered 2017-08-14: 8 mg via INTRAMUSCULAR

## 2017-08-14 NOTE — Discharge Instructions (Addendum)
Symptoms today seem most likely to be due to a stomach bug.  No danger signs on exam.  Anticipate gradual improvement in vomiting and upper abdominal discomfort over the next 48 hours.  Prescriptions for promethazine, and nausea medicine, and famotidine, for stomach acid, were sent to the pharmacy.  Sip fluids and rest.  Note for work tomorrow.

## 2017-08-14 NOTE — ED Triage Notes (Signed)
Pt stated "I started my period at school and I started throwing up non stop" every time she gets her period she gets stomach pains like this.

## 2017-08-14 NOTE — ED Provider Notes (Signed)
[facility]    CSN: [id number] Arrival date & time: 08/14/17  1243     History   Chief Complaint Chief Complaint  Patient presents with  . Abdominal Pain  . Emesis    HPI Beth Bray is a 21 y.o. female.   She presents today with onset of vomiting today, has thrown up about 10 times.  No diarrhea.  She does have abdominal pain, and her mother says she has had this every day for the last 3 years, abdominal and back pain.  This is nonspecific, the patient is not able to characterize it other than to say it is everywhere.  Her period started today, and her last one she says was 3 weeks ago.  Her last bowel movement was yesterday and it was normal for her.  There are no urinary symptoms. Mother is with her and says that she personally had vomiting last night. Patient's grandmother is vomiting and has diarrhea today.    HPI  Past Medical History:  Diagnosis Date  . Asthma   . Depression     Patient Active Problem List   Diagnosis Date Noted  . DUB (dysfunctional uterine bleeding) 08/12/2015    Past Surgical History:  Procedure Laterality Date  . NO PAST SURGERIES      OB History    Gravida Para Term Preterm AB Living   0 0 0 0 0 0   SAB TAB Ectopic Multiple Live Births   0 0 0 0         Home Medications    Prior to Admission medications   Medication Sig Start Date End Date Taking? Authorizing Provider  ClonazePAM (KLONOPIN PO) Take by mouth.   Yes [provider]  albuterol (PROVENTIL HFA;VENTOLIN HFA) 108 (90 BASE) MCG/ACT inhaler Inhale 3 puffs into the lungs every 4 (four) hours as needed for wheezing. 06/05/12   Marcellina MillinGaley, Timothy, MD  famotidine (PEPCID) 40 MG tablet Take 1 tablet (40 mg total) by mouth 2 (two) times daily. 08/14/17   Eustace MooreMurray, Chelsie Burel W, MD  ferrous sulfate 325 (65 FE) MG tablet Take 1 tablet (325 mg total) by mouth daily with breakfast. 04/03/17   Willodean RosenthalHarraway-Smith, Carolyn, MD  ibuprofen (ADVIL,MOTRIN) 800 MG tablet Take 1 tablet  (800 mg total) by mouth 3 (three) times daily. 07/25/16   Katrinka BlazingSmith, [place], CNM  medroxyPROGESTERone (PROVERA) 10 MG tablet Take 2 tablets (20 mg total) by mouth daily. 03/12/17   Levie HeritageStinson, Jacob J, DO  Norethindrone Acetate-Ethinyl Estradiol (LOESTRIN 1.5/30, 21,) 1.5-30 MG-MCG tablet Take 1 tablet by mouth daily. 03/20/17   Willodean RosenthalHarraway-Smith, Carolyn, MD  promethazine (PHENERGAN) 12.5 MG tablet Take 1 tablet (12.5 mg total) by mouth 4 (four) times daily as needed for nausea or vomiting. 08/14/17   Eustace MooreMurray, Alannie Amodio W, MD    Family History No family history on file.  Social History Social History   Tobacco Use  . Smoking status: Passive Smoke Exposure - Never Smoker  . Smokeless tobacco: Never Used  Substance Use Topics  . Alcohol use: No  . Drug use: No     Allergies   Pineapple   Review of Systems Review of Systems  All other systems reviewed and are negative.    Physical Exam Triage Vital Signs ED Triage Vitals [08/14/17 1314]  Enc Vitals Group     BP 114/71     Pulse Rate 81     Resp (!) 22     Temp (!) 97.1 F (36.2 C)  Temp src      SpO2 100 %     Weight      Height      Pain Score      Pain Loc    Updated Vital Signs BP 114/71   Pulse 81   Temp (!) 97.1 F (36.2 C)   Resp (!) 22   LMP 08/14/2017   SpO2 100%   See nurse note for orthostatics: benign  Physical Exam  Constitutional: She is oriented to person, place, and time. No distress.  Patient is lying on her side on the exam table, moaning.  She was able to sit up with assistance for exam Able to ambulate to bathroom without assistance, with steady gait  HENT:  Head: Atraumatic.  Eyes:  Conjugate gaze, no eye redness/drainage  Neck: Neck supple.  Cardiovascular: Normal rate and regular rhythm.  Pulmonary/Chest: No respiratory distress. She has no wheezes. She has no rhonchi. She has no rales.  Lungs clear, symmetric breath sounds  Abdominal: Soft. She exhibits no distension. There is no rebound  and no guarding.  Moderate tenderness to palpation in the epigastrium  Musculoskeletal: Normal range of motion.  No leg swelling  Neurological: She is alert and oriented to person, place, and time.  Skin: Skin is warm and dry.  No cyanosis  Nursing note and vitals reviewed.    UC Treatments / Results   Procedures Procedures (including critical care time)  Medications Ordered in UC Medications  ondansetron (ZOFRAN-ODT) disintegrating tablet 8 mg (8 mg Oral Given 08/14/17 1340)  gi cocktail (Maalox,Lidocaine,Donnatal) (30 mLs Oral Given 08/14/17 1342)  ondansetron (ZOFRAN) injection 8 mg (8 mg Intramuscular Given 08/14/17 1400)   Oral ondansetron and gi cocktail given at urgent care ,with subsequent emesis.  Repeat dose of ondansetron given IM with improvement in emesis.     Final Clinical Impressions(s) / UC Diagnoses   Final diagnoses:  Acute gastroenteritis  Chronic abdominal pain   Symptoms today seem most likely to be due to a stomach bug.  No danger signs on exam.  Anticipate gradual improvement in vomiting and upper abdominal discomfort over the next 48 hours.  Prescriptions for promethazine, and nausea medicine, and famotidine, for stomach acid, were sent to the pharmacy.  Sip fluids and rest.  Note for work tomorrow. Followup with gastroenterologist for further evaluation of ongoing abdominal pain issues.  ED Discharge Orders        Ordered    famotidine (PEPCID) 40 MG tablet  2 times daily     08/14/17 1359    promethazine (PHENERGAN) 12.5 MG tablet  4 times daily PRN     08/14/17 1359       Controlled Substance Prescriptions [place] Controlled Substance Registry consulted? No   Eustace MooreMurray, Ahsley Attwood W, MD 08/15/17 [phone]

## 2017-08-14 NOTE — ED Notes (Signed)
Pt vomited after taking Zofran and GI Cocktail

## 2017-12-15 ENCOUNTER — Encounter (HOSPITAL_COMMUNITY): Payer: Self-pay | Admitting: *Deleted

## 2017-12-15 ENCOUNTER — Inpatient Hospital Stay (HOSPITAL_COMMUNITY)
Admission: AD | Admit: 2017-12-15 | Discharge: 2017-12-15 | Disposition: A | Discharge status: Home / Self Care | Payer: Medicaid Other | Source: Ambulatory Visit | Attending: Obstetrics and Gynecology | Admitting: Obstetrics and Gynecology

## 2017-12-15 DIAGNOSIS — N912 Amenorrhea, unspecified: Secondary | ICD-10-CM

## 2017-12-15 NOTE — MAU Provider Note (Signed)
Ms. Beth Bray is a 22 y.o. G0P0000 who present to MAU today for pregnancy confirmation. She denies abdominal pain or vaginal bleeding.   There were no vitals taken for this visit. CONSTITUTIONAL: Well-developed, well-nourished female in no acute distress.  CARDIOVASCULAR: Normal heart rate noted RESPIRATORY: Effort and breath sounds normal GASTROINTESTINAL:Soft, no distention noted.  No tenderness, rebound or guarding.  SKIN: Skin is warm and dry. No rash noted. Not diaphoretic. No erythema. No pallor. PSYCHIATRIC: Normal mood and affect. Normal behavior. Normal judgment and thought content.  MDM Medical screening exam complete Patient does not endorse any symptoms concerning for ectopic pregnancy or pregnancy related complication today.   A:  Amenorrhea  P: Discharge home Patient advised that she can present as a walk-in to [facility] for a pregnancy test M-Th between 8am-4pm or Friday between 8am -11am Reasons to return to MAU reviewed  Patient may return to MAU as needed or if her condition were to change or worsen  Montez MoritaLawson, Durk Carmen D, CNM 12/15/2017 11:44 AM

## 2017-12-15 NOTE — MAU Note (Signed)
Pt reports 2 positive home preg test, denies any problems. States she just wants to make sure she is.

## 2017-12-16 ENCOUNTER — Encounter: Payer: Self-pay | Admitting: Obstetrics & Gynecology

## 2017-12-17 ENCOUNTER — Encounter: Payer: Self-pay | Admitting: Obstetrics & Gynecology

## 2017-12-17 ENCOUNTER — Ambulatory Visit (INDEPENDENT_AMBULATORY_CARE_PROVIDER_SITE_OTHER): Payer: Medicaid Other | Admitting: *Deleted

## 2017-12-17 ENCOUNTER — Encounter: Payer: Self-pay | Admitting: Family Medicine

## 2017-12-17 DIAGNOSIS — Z3201 Encounter for pregnancy test, result positive: Secondary | ICD-10-CM

## 2017-12-17 DIAGNOSIS — Z3200 Encounter for pregnancy test, result unknown: Secondary | ICD-10-CM

## 2017-12-17 LAB — POCT PREGNANCY, URINE: PREG TEST UR: POSITIVE — AB

## 2017-12-17 NOTE — Progress Notes (Signed)
Pt informed of +UPT.  LMP 11/04/17.  EDD 08/11/18. Pt states she stopped taking Klonopin 2 days ago when she had + UPT @ home. Medication reconciliation performed. Pt plans prenatal care @ [facility]

## 2017-12-17 NOTE — Progress Notes (Signed)
Patient seen and assessed by nursing staff.  Agree with documentation and plan.  

## 2017-12-21 ENCOUNTER — Encounter (HOSPITAL_COMMUNITY): Payer: Self-pay | Admitting: *Deleted

## 2017-12-21 ENCOUNTER — Encounter: Payer: Self-pay | Admitting: Obstetrics & Gynecology

## 2017-12-21 ENCOUNTER — Inpatient Hospital Stay (HOSPITAL_COMMUNITY): Payer: Medicaid Other

## 2017-12-21 ENCOUNTER — Inpatient Hospital Stay (HOSPITAL_COMMUNITY)
Admission: AD | Admit: 2017-12-21 | Discharge: 2017-12-21 | Disposition: A | Discharge status: Home / Self Care | Payer: Medicaid Other | Source: Ambulatory Visit | Attending: Obstetrics and Gynecology | Admitting: Obstetrics and Gynecology

## 2017-12-21 DIAGNOSIS — R109 Unspecified abdominal pain: Secondary | ICD-10-CM | POA: Diagnosis not present

## 2017-12-21 DIAGNOSIS — Z7722 Contact with and (suspected) exposure to environmental tobacco smoke (acute) (chronic): Secondary | ICD-10-CM | POA: Insufficient documentation

## 2017-12-21 DIAGNOSIS — O26891 Other specified pregnancy related conditions, first trimester: Secondary | ICD-10-CM | POA: Insufficient documentation

## 2017-12-21 DIAGNOSIS — Z3A01 Less than 8 weeks gestation of pregnancy: Secondary | ICD-10-CM | POA: Insufficient documentation

## 2017-12-21 DIAGNOSIS — Z3491 Encounter for supervision of normal pregnancy, unspecified, first trimester: Secondary | ICD-10-CM

## 2017-12-21 DIAGNOSIS — O26899 Other specified pregnancy related conditions, unspecified trimester: Secondary | ICD-10-CM

## 2017-12-21 LAB — WET PREP, GENITAL
CLUE CELLS WET PREP: NONE SEEN
Trich, Wet Prep: NONE SEEN
Yeast Wet Prep HPF POC: NONE SEEN

## 2017-12-21 LAB — URINALYSIS, ROUTINE W REFLEX MICROSCOPIC
BILIRUBIN URINE: NEGATIVE
GLUCOSE, UA: NEGATIVE mg/dL
HGB URINE DIPSTICK: NEGATIVE
KETONES UR: 20 mg/dL — AB
LEUKOCYTES UA: NEGATIVE
Nitrite: NEGATIVE
PROTEIN: NEGATIVE mg/dL
Specific Gravity, Urine: 1.026 (ref 1.005–1.030)
pH: 6 (ref 5.0–8.0)

## 2017-12-21 LAB — CBC
HCT: 35.6 % — ABNORMAL LOW (ref 36.0–46.0)
Hemoglobin: 11.5 g/dL — ABNORMAL LOW (ref 12.0–15.0)
MCH: 24.9 pg — AB (ref 26.0–34.0)
MCHC: 32.3 g/dL (ref 30.0–36.0)
MCV: 77.2 fL — AB (ref 78.0–100.0)
PLATELETS: 278 10*3/uL (ref 150–400)
RBC: 4.61 MIL/uL (ref 3.87–5.11)
RDW: 16.4 % — ABNORMAL HIGH (ref 11.5–15.5)
WBC: 8.3 10*3/uL (ref 4.0–10.5)

## 2017-12-21 LAB — ABO/RH: ABO/RH(D): O POS

## 2017-12-21 LAB — HCG, QUANTITATIVE, PREGNANCY: HCG, BETA CHAIN, QUANT, S: 132957 m[IU]/mL — AB (ref <5)

## 2017-12-21 NOTE — MAU Provider Note (Signed)
History     CSN: [id number]  Arrival date and time: 12/21/17 1405   First Provider Initiated Contact with Patient 12/21/17 1611      Chief Complaint  Patient presents with  . Abdominal Pain  . Back Pain   G1 @[redacted]w[redacted]d  here with LAP and LBP. Sx started about 1 week ago. LAP is bilateral and sharp and radiates to her back. She took Tylenol but did not get relief. She denies urinary sx. No recent injury, lifting, or pushing/pulling. No VB or discharge.    OB History    Gravida  1   Para  0   Term  0   Preterm  0   AB  0   Living  0     SAB  0   TAB  0   Ectopic  0   Multiple  0   Live Births              Past Medical History:  Diagnosis Date  . Asthma   . Depression     Past Surgical History:  Procedure Laterality Date  . NO PAST SURGERIES      History reviewed. No pertinent family history.  Social History   Tobacco Use  . Smoking status: Passive Smoke Exposure - Never Smoker  . Smokeless tobacco: Never Used  Substance Use Topics  . Alcohol use: No  . Drug use: No    Allergies:  Allergies  Allergen Reactions  . Pineapple     Bumps and sores in mouth    No medications prior to admission.    Review of Systems  Constitutional: Negative for chills and fever.  Gastrointestinal: Positive for abdominal pain, constipation and nausea. Negative for diarrhea and vomiting.  Genitourinary: Negative for dysuria, hematuria, urgency, vaginal bleeding and vaginal discharge.  Musculoskeletal: Positive for back pain.   Physical Exam   Blood pressure (!) 102/52, pulse 70, temperature 98.8 F (37.1 C), temperature source Oral, resp. rate 18, height 5\' 3"  (1.6 m), weight 122 lb (55.3 kg), last menstrual period 11/04/2017.  Physical Exam  Nursing note and vitals reviewed. Constitutional: She is oriented to person, place, and time. She appears well-developed and well-nourished. No distress.  HENT:  Head: Normocephalic and atraumatic.  Neck: Normal  range of motion.  Cardiovascular: Normal rate.  Respiratory: Effort normal. No respiratory distress.  GI: Soft. She exhibits no distension and no mass. There is no tenderness. There is no rebound and no guarding.  Genitourinary:  Genitourinary Comments: External: no lesions or erythema Vagina: rugated, pink, moist, scant white discharge Uterus: non enlarged, anteverted, non tender, no CMT Adnexae: no masses, no tenderness left, no tenderness right Cervix closed/long   Musculoskeletal: Normal range of motion.  Neurological: She is alert and oriented to person, place, and time.  Skin: Skin is warm and dry.  Psychiatric: She has a normal mood and affect.   Results for orders placed or performed during the hospital encounter of 12/21/17 (from the past 24 hour(s))  Urinalysis, Routine w reflex microscopic     Status: Abnormal   Collection Time: 12/21/17  2:25 PM  Result Value Ref Range   Color, Urine YELLOW YELLOW   APPearance HAZY (A) CLEAR   Specific Gravity, Urine 1.026 1.005 - 1.030   pH 6.0 5.0 - 8.0   Glucose, UA NEGATIVE NEGATIVE mg/dL   Hgb urine dipstick NEGATIVE NEGATIVE   Bilirubin Urine NEGATIVE NEGATIVE   Ketones, ur 20 (A) NEGATIVE mg/dL   Protein, ur NEGATIVE  NEGATIVE mg/dL   Nitrite NEGATIVE NEGATIVE   Leukocytes, UA NEGATIVE NEGATIVE  ABO/Rh     Status: None (Preliminary result)   Collection Time: 12/21/17  2:37 PM  Result Value Ref Range   ABO/RH(D)      O POS Performed at [facility], [address] [place], [place] [address]   CBC     Status: Abnormal   Collection Time: 12/21/17  2:37 PM  Result Value Ref Range   WBC 8.3 4.0 - 10.5 K/uL   RBC 4.61 3.87 - 5.11 MIL/uL   Hemoglobin 11.5 (L) 12.0 - 15.0 g/dL   HCT 45.4 (L) 09.8 - 11.9 %   MCV 77.2 (L) 78.0 - 100.0 fL   MCH 24.9 (L) 26.0 - 34.0 pg   MCHC 32.3 30.0 - 36.0 g/dL   RDW 14.7 (H) 82.9 - 56.2 %   Platelets 278 150 - 400 K/uL  hCG, quantitative, pregnancy     Status: Abnormal   Collection  Time: 12/21/17  2:37 PM  Result Value Ref Range   hCG, Beta Chain, Quant, S 132,957 (H) <5 mIU/mL  Wet prep, genital     Status: Abnormal   Collection Time: 12/21/17  5:05 PM  Result Value Ref Range   Yeast Wet Prep HPF POC NONE SEEN NONE SEEN   Trich, Wet Prep NONE SEEN NONE SEEN   Clue Cells Wet Prep HPF POC NONE SEEN NONE SEEN   WBC, Wet Prep HPF POC MANY (A) NONE SEEN   Sperm PRESENT    US Ob Less Than 14 Weeks With Ob Transvaginal  Result Date: 12/21/2017 CLINICAL DATA:  Abdominal pain in first-trimester pregnancy EXAM: OBSTETRIC <14 WK [place] AND TRANSVAGINAL OB US TECHNIQUE: Both transabdominal and transvaginal ultrasound examinations were performed for complete evaluation of the gestation as well as the maternal uterus, adnexal regions, and pelvic cul-de-sac. Transvaginal technique was performed to assess early pregnancy. COMPARISON:  02/11/2017 FINDINGS: Intrauterine gestational sac: Single Yolk sac:  Visualized. Embryo:  Visualized. Cardiac Activity: Visualized. Heart Rate: 132 bpm CRL:  9.8 mm   7 w   0 d                  [place] EDC: 08/09/2018 Subchorionic hemorrhage: 15 x 7 mm hematoma along the lower gestational sac. Maternal uterus/adnexae: No abnormal finding. IMPRESSION: 1. Single living intrauterine pregnancy measuring 7 weeks. 2. 15 x 7 mm subchronic hemorrhage along the lower gestational sac. Electronically Signed   By: Marnee Spring M.D.   On: 12/21/2017 17:52   MAU Course  Procedures  MDM Labs and [place] ordered and reviewed. Normal IUP on [place]. Pain likely physiologic to early pregnancy and/or constipation. Stable for discharge.  Assessment and Plan   1. [redacted] weeks gestation of pregnancy   2. Abdominal pain in pregnancy   3. Normal intrauterine pregnancy on prenatal ultrasound in first trimester    Discharge home Follow up at [facility] to [facility] care-has appt SAB/return precautions OTC med list provided  Allergies as of 12/21/2017      Reactions   Pineapple    Bumps and sores in  mouth      Medication List    STOP taking these medications   famotidine 40 MG tablet Commonly known as:  PEPCID   ferrous sulfate 325 (65 FE) MG tablet   promethazine 12.5 MG tablet Commonly known as:  PHENERGAN     TAKE these medications   acetaminophen 325 MG tablet Commonly known as:  TYLENOL Take  325 mg by mouth every 6 (six) hours as needed.   albuterol 108 (90 Base) MCG/ACT inhaler Commonly known as:  PROVENTIL HFA;VENTOLIN HFA Inhale 3 puffs into the lungs every 4 (four) hours as needed for wheezing.   multivitamin-prenatal 27-0.8 MG Tabs tablet Take 1 tablet by mouth daily at 12 noon.      Donette LarryMelanie Toshiyuki Fredell, CNM 12/21/2017, 6:52 PM

## 2017-12-21 NOTE — MAU Provider Note (Addendum)
Chief Complaint: Abdominal Pain and Back Pain   First Provider Initiated Contact with Patient 12/21/17 1611      SUBJECTIVE HPI: Beth Bray is a 22 y.o. G1P0000 at [redacted]w[redacted]d by LMP who presents to maternity admissions reporting abdominal and back pain since Saturday. She states that the provider she saw on Saturday told her the pain was pregnancy related and to take Tylenol. She endorses that she has been taking the Tylenol as directed but the pain has gotten worse. She describes it as a sharp constant pain in her lower abdomen with radiation to her back. Rates the pain currently as a 9/10. She also reports constipation and nausea. She is not currently trying any therapies for the nausea and constipation.  She denies vaginal bleeding, vaginal discharge, vomiting, diarrhea, urinary symptoms, abdominal cramping, and fevers/chills.    HPI  Past Medical History:  Diagnosis Date  . Asthma   . Depression    Past Surgical History:  Procedure Laterality Date  . NO PAST SURGERIES     Social History   Socioeconomic History  . Marital status: Single    Spouse name: Not on file  . Number of children: Not on file  . Years of education: Not on file  . Highest education level: Not on file  Occupational History  . Not on file  Social Needs  . Financial resource strain: Not on file  . Food insecurity:    Worry: Not on file    Inability: Not on file  . Transportation needs:    Medical: Not on file    Non-medical: Not on file  Tobacco Use  . Smoking status: Passive Smoke Exposure - Never Smoker  . Smokeless tobacco: Never Used  Substance and Sexual Activity  . Alcohol use: No  . Drug use: No  . Sexual activity: Yes    Birth control/protection: None  Lifestyle  . Physical activity:    Days per week: Not on file    Minutes per session: Not on file  . Stress: Not on file  Relationships  . Social connections:    Talks on phone: Not on file    Gets together: Not on file    Attends  religious service: Not on file    Active member of club or organization: Not on file    Attends meetings of clubs or organizations: Not on file    Relationship status: Not on file  . Intimate partner violence:    Fear of current or ex partner: Not on file    Emotionally abused: Not on file    Physically abused: Not on file    Forced sexual activity: Not on file  Other Topics Concern  . Not on file  Social History Narrative  . Not on file   No current facility-administered medications on file prior to encounter.    Current Outpatient Medications on File Prior to Encounter  Medication Sig Dispense Refill  . acetaminophen (TYLENOL) 325 MG tablet Take 325 mg by mouth every 6 (six) hours as needed.    . Prenatal Vit-Fe Fumarate-FA (MULTIVITAMIN-PRENATAL) 27-0.8 MG TABS tablet Take 1 tablet by mouth daily at 12 noon.    Marland Kitchen albuterol (PROVENTIL HFA;VENTOLIN HFA) 108 (90 BASE) MCG/ACT inhaler Inhale 3 puffs into the lungs every 4 (four) hours as needed for wheezing. (Patient not taking: Reported on 12/17/2017) 1 Inhaler 0  . famotidine (PEPCID) 40 MG tablet Take 1 tablet (40 mg total) by mouth 2 (two) times daily. (Patient not taking:  Reported on 12/17/2017) 60 tablet 0  . ferrous sulfate 325 (65 FE) MG tablet Take 1 tablet (325 mg total) by mouth daily with breakfast. (Patient not taking: Reported on 12/17/2017) 30 tablet 3  . promethazine (PHENERGAN) 12.5 MG tablet Take 1 tablet (12.5 mg total) by mouth 4 (four) times daily as needed for nausea or vomiting. (Patient not taking: Reported on 12/17/2017) 20 tablet 0   Allergies  Allergen Reactions  . Pineapple     Bumps and sores in mouth    ROS:  Review of Systems  Constitutional: Negative for chills and fever.  Gastrointestinal: Positive for abdominal pain, constipation and nausea. Negative for diarrhea and vomiting.  Genitourinary: Negative for dysuria, frequency, hematuria, urgency, vaginal bleeding and vaginal discharge.   I have reviewed  patient's Past Medical Hx, Surgical Hx, Family Hx, Social Hx, medications and allergies.   Physical Exam   Patient Vitals for the past 24 hrs:  BP Temp Temp src Pulse Resp Height Weight  12/21/17 1421 (!) 105/54 98.8 F (37.1 C) Oral 73 16 5\' 3"  (1.6 m) 55.3 kg (122 lb)   Constitutional: Well-developed, well-nourished female in no acute distress.  Cardiovascular: normal rate Respiratory: normal effort GI: Abd soft, non-tender. Pos BS x 4 Neurologic: Alert and oriented x 4.  PELVIC EXAM:  and oriented x 4.  PELVIC EXAM: Cervix pink, visually closed, without lesion, scant white creamy discharge, vaginal walls and external genitalia normal Bimanual exam: Cervix 0/long/high, firm, anterior, neg CMT, uterus nontender, nonenlarged, adnexa without tenderness, enlargement,   LAB RESULTS Results for orders placed or performed during the hospital encounter of 12/21/17 (from the past 24 hour(s))  Urinalysis, Routine w reflex microscopic     Status: Abnormal   Collection Time: 12/21/17  2:25 PM  Result Value Ref Range   Color, Urine YELLOW YELLOW   APPearance HAZY (A) CLEAR   Specific Gravity, Urine 1.026 1.005 - 1.030   pH 6.0 5.0 - 8.0   Glucose, UA NEGATIVE NEGATIVE mg/dL   Hgb urine dipstick NEGATIVE NEGATIVE   Bilirubin Urine NEGATIVE NEGATIVE   Ketones, ur 20 (A) NEGATIVE mg/dL   Protein, ur NEGATIVE NEGATIVE mg/dL   Nitrite NEGATIVE NEGATIVE   Leukocytes, UA NEGATIVE NEGATIVE  ABO/Rh     Status: None (Preliminary result)   Collection Time: 12/21/17  2:37 PM  Result Value Ref Range   ABO/RH(D)      O POS Performed at [facility], [address], [place], [place] [address]   CBC     Status: Abnormal   Collection Time: 12/21/17  2:37 PM  Result Value Ref Range   WBC 8.3 4.0 - 10.5 K/uL   RBC 4.61 3.87 - 5.11 MIL/uL   Hemoglobin 11.5 (L) 12.0 - 15.0 g/dL   HCT 46.9 (L) 62.9 - 52.8 %   MCV 77.2 (L) 78.0 - 100.0 fL   MCH 24.9 (L) 26.0 - 34.0 pg   MCHC 32.3 30.0 - 36.0 g/dL    RDW 41.3 (H) 24.4 - 15.5 %   Platelets 278 150 - 400 K/uL  hCG, quantitative, pregnancy     Status: Abnormal   Collection Time: 12/21/17  2:37 PM  Result Value Ref Range   hCG, Beta Chain, Quant, S 132,957 (H) <5 mIU/mL  Wet prep, genital     Status: Abnormal   Collection Time: 12/21/17  5:05 PM  Result Value Ref Range   Yeast Wet Prep HPF POC NONE SEEN NONE SEEN   Trich, Wet Prep  NONE SEEN NONE SEEN   Clue Cells Wet Prep HPF POC NONE SEEN NONE SEEN   WBC, Wet Prep HPF POC MANY (A) NONE SEEN   Sperm PRESENT     --/--/O POS Performed at [facility], [address] [place], [place] [address]  (04/12 1437)  IMAGING [place] Ob Less Than 14 Weeks With Ob Transvaginal  Result Date: 12/21/2017 CLINICAL DATA:  Abdominal pain in first-trimester pregnancy EXAM: OBSTETRIC <14 WK US AND TRANSVAGINAL OB US TECHNIQUE: Both transabdominal and transvaginal ultrasound examinations were performed for complete evaluation of the gestation as well as the maternal uterus, adnexal regions, and pelvic cul-de-sac. Transvaginal technique was performed to assess early pregnancy. COMPARISON:  02/11/2017 FINDINGS: Intrauterine gestational sac: Single Yolk sac:  Visualized. Embryo:  Visualized. Cardiac Activity: Visualized. Heart Rate: 132 bpm CRL:  9.8 mm   7 w   0 d                  US EDC: 08/09/2018 Subchorionic hemorrhage: 15 x 7 mm hematoma along the lower gestational sac. Maternal uterus/adnexae: No abnormal finding. IMPRESSION: 1. Single living intrauterine pregnancy measuring 7 weeks. 2. 15 x 7 mm subchronic hemorrhage along the lower gestational sac. Electronically Signed   By: Marnee SpringJonathon  Watts M.D.   On: 12/21/2017 17:52    MAU Management/MDM: Orders Placed This Encounter  Procedures  . Wet prep, genital  . US OB LESS THAN 14 WEEKS WITH OB TRANSVAGINAL  . Urinalysis, Routine w reflex microscopic  . CBC  . hCG, quantitative, pregnancy  . ABO/Rh    No orders of the defined types were placed in  this encounter.    ASSESSMENT 1. [redacted] weeks gestation of pregnancy   2. Abdominal pain in pregnancy   3. Normal intrauterine pregnancy on prenatal ultrasound in first trimester     PLAN -Obtain wet prep, urrinalysis, CBC, hCG, GC/CT, and ABO Rh - Obtain US - Provide list of OTC antiemetic medications  - Advise to continue taking Tylenol as needed for abdominal pain     Shirlee Limerickeko Jaeger Trueheart, Student-PA 12/21/2017  6:04 PM

## 2017-12-21 NOTE — MAU Note (Signed)
Pt went to MD office last week, was told to take tylenol for her lower abd & back pain, but it is not helping.  Was advised to come to MAU.  Denies bleeding.

## 2017-12-21 NOTE — Discharge Instructions (Signed)
Abdominal Pain During Pregnancy Abdominal pain is common in pregnancy. Most of the time, it does not cause harm. There are many causes of abdominal pain. Some causes are more serious than others and sometimes the cause is not known. Abdominal pain can be a sign that something is very wrong with the pregnancy or the pain may have nothing to do with the pregnancy. Always tell your health care provider if you have any abdominal pain. Follow these instructions at home:  Do not have sex or put anything in your vagina until your symptoms go away completely.  Watch your abdominal pain for any changes.  Get plenty of rest until your pain improves.  Drink enough fluid to keep your urine clear or pale yellow.  Take over-the-counter or prescription medicines only as told by your health care provider.  Keep all follow-up visits as told by your health care provider. This is important. Contact a health care provider if:  You have a fever.  Your pain gets worse or you have cramping.  Your pain continues after resting. Get help right away if:  You are bleeding, leaking fluid, or passing tissue from the vagina.  You have vomiting or diarrhea that does not go away.  You have painful or bloody urination.  You notice a decrease in your baby's movements.  You feel very weak or faint.  You have shortness of breath.  You develop a severe headache with abdominal pain.  You have abnormal vaginal discharge with abdominal pain. This information is not intended to replace advice given to you by your health care provider. Make sure you discuss any questions you have with your health care provider. Document Released: 08/28/2005 Document Revised: 06/08/2016 Document Reviewed: 03/27/2013 Elsevier Interactive Patient Education  2018 [employer]  Morning Sickness Morning sickness is when you feel sick to your stomach (nauseous) during pregnancy. You may feel sick to your stomach and throw up (vomit).  You may feel sick in the morning, but you can feel this way any time of day. Some women feel very sick to their stomach and cannot stop throwing up (hyperemesis gravidarum). Follow these instructions at home:  Only take medicines as told by your doctor.  Take multivitamins as told by your doctor. Taking multivitamins before getting pregnant can stop or lessen the harshness of morning sickness.  Eat dry toast or unsalted crackers before getting out of bed.  Eat 5 to 6 small meals a day.  Eat dry and bland foods like rice and baked potatoes.  Do not drink liquids with meals. Drink between meals.  Do not eat greasy, fatty, or spicy foods.  Have someone cook for you if the smell of food causes you to feel sick or throw up.  If you feel sick to your stomach after taking prenatal vitamins, take them at night or with a snack.  Eat protein when you need a snack (nuts, yogurt, cheese).  Eat unsweetened gelatins for dessert.  Wear a bracelet used for sea sickness (acupressure wristband).  Go to a doctor that puts thin needles into certain body points (acupuncture) to improve how you feel.  Do not smoke.  Use a humidifier to keep the air in your house free of odors.  Get lots of fresh air. Contact a doctor if:  You need medicine to feel better.  You feel dizzy or lightheaded.  You are losing weight. Get help right away if:  You feel very sick to your stomach and cannot stop throwing up.  You pass out (faint). This information is not intended to replace advice given to you by your health care provider. Make sure you discuss any questions you have with your health care provider. Document Released: 10/05/2004 Document Revised: 02/03/2016 Document Reviewed: 02/12/2013 Elsevier Interactive Patient Education  2017 [employer]

## 2017-12-24 LAB — GC/CHLAMYDIA PROBE AMP (~~LOC~~) NOT AT ARMC
Chlamydia: NEGATIVE
Neisseria Gonorrhea: NEGATIVE

## 2018-01-02 ENCOUNTER — Encounter: Payer: Self-pay | Admitting: Obstetrics & Gynecology

## 2018-01-04 ENCOUNTER — Encounter: Payer: Self-pay | Admitting: Student

## 2018-01-16 ENCOUNTER — Encounter: Payer: Self-pay | Admitting: Obstetrics & Gynecology

## 2018-02-07 ENCOUNTER — Encounter: Payer: Self-pay | Admitting: *Deleted

## 2018-02-07 ENCOUNTER — Encounter: Payer: Self-pay | Admitting: Student

## 2018-02-07 ENCOUNTER — Ambulatory Visit (INDEPENDENT_AMBULATORY_CARE_PROVIDER_SITE_OTHER): Payer: Medicaid Other | Admitting: Student

## 2018-02-07 DIAGNOSIS — K5900 Constipation, unspecified: Secondary | ICD-10-CM | POA: Insufficient documentation

## 2018-02-07 DIAGNOSIS — Z3400 Encounter for supervision of normal first pregnancy, unspecified trimester: Secondary | ICD-10-CM | POA: Insufficient documentation

## 2018-02-07 DIAGNOSIS — Z3401 Encounter for supervision of normal first pregnancy, first trimester: Secondary | ICD-10-CM

## 2018-02-07 LAB — POCT URINALYSIS DIP (DEVICE)
Bilirubin Urine: NEGATIVE
GLUCOSE, UA: NEGATIVE mg/dL
Hgb urine dipstick: NEGATIVE
KETONES UR: NEGATIVE mg/dL
Leukocytes, UA: NEGATIVE
Nitrite: NEGATIVE
PROTEIN: NEGATIVE mg/dL
SPECIFIC GRAVITY, URINE: 1.015 (ref 1.005–1.030)
Urobilinogen, UA: 0.2 mg/dL (ref 0.0–1.0)
pH: 7.5 (ref 5.0–8.0)

## 2018-02-07 NOTE — Progress Notes (Signed)
Subjective:   Vitalia Stough is a 22 y.o. G1P0000 at [redacted]w[redacted]d by LMP (early u/s c/w LMP) being seen today for her first obstetrical visit.  Her obstetrical history is significant for teen pregnancy. This was not a planned pregnancy; her family is happy. FOB is here for initial OB visit. Pregnancy history fully reviewed.  Patient reports abdominal pain, back pain, and constipation.  Reports constant low back pain and RLQ pain with the pregnancy. Has been taking tylenol without relief. Abdominal pain worse this morning. Last BM was nearly 2 weeks ago. Normally has BM every week. Is not taking anything to treat constipation. Denies fever, n/v, vaginal bleeding, or dysuria.   HISTORY: OB History  Gravida Para Term Preterm AB Living  1 0 0 0 0 0  SAB TAB Ectopic Multiple Live Births  0 0 0 0 0    # Outcome Date GA Lbr Len/2nd Weight Sex Delivery Anes PTL Lv  1 Current            Past Medical History:  Diagnosis Date  . Asthma   . Depression    Past Surgical History:  Procedure Laterality Date  . NO PAST SURGERIES     Family History  Problem Relation Age of Onset  . Hypertension Mother   . Heart disease Mother   . Thyroid disease Mother    Social History   Tobacco Use  . Smoking status: Passive Smoke Exposure - Never Smoker  . Smokeless tobacco: Never Used  Substance Use Topics  . Alcohol use: No  . Drug use: No   Allergies  Allergen Reactions  . Pineapple     Bumps and sores in mouth   Current Outpatient Medications on File Prior to Visit  Medication Sig Dispense Refill  . acetaminophen (TYLENOL) 325 MG tablet Take 325 mg by mouth every 6 (six) hours as needed.    Marland Kitchen albuterol (PROVENTIL HFA;VENTOLIN HFA) 108 (90 BASE) MCG/ACT inhaler Inhale 3 puffs into the lungs every 4 (four) hours as needed for wheezing. 1 Inhaler 0  . Prenatal Vit-Fe Fumarate-FA (MULTIVITAMIN-PRENATAL) 27-0.8 MG TABS tablet Take 1 tablet by mouth daily at 12 noon.     No current  facility-administered medications on file prior to visit.     Exam   Vitals:   02/07/18 0912  BP: (!) 86/54  Pulse: 93  Weight: 120 lb 3.2 oz (54.5 kg)   Fetal Heart Rate (bpm): 145  System: General: well-developed, well-nourished female in no acute distress   Breast:  normal appearance, no masses or tenderness   Skin: normal coloration and turgor, no rashes   Neurologic: oriented, normal, negative, normal mood   Extremities: normal strength, tone, and muscle mass, ROM of all joints is normal   HEENT PERRLA, extraocular movement intact and sclera clear, anicteric   Mouth/Teeth mucous membranes moist, pharynx normal without lesions and dental hygiene good   Neck supple and no masses   Cardiovascular: regular rate and rhythm   Respiratory:  no respiratory distress, normal breath sounds   Abdomen: soft, non-tender; bowel sounds normal; no masses,  no organomegaly     Assessment:   Pregnancy: G1P0000 Patient Active Problem List   Diagnosis Date Noted  . Supervision of low-risk first pregnancy 02/07/2018  . DUB (dysfunctional uterine bleeding) 08/12/2015     Plan:  1. Encounter for supervision of low-risk first pregnancy, antepartum  - [facility] AMB BABYSCRIPTS SCHEDULE OPTIMIZATION - Obstetric Panel, Including HIV - Hemoglobinopathy Evaluation - SMN1  COPY NUMBER ANALYSIS (SMA Carrier Screen) - Culture, OB Urine - [place] MFM OB COMP + 14 WK; Future - Genetic Screening - Cystic fibrosis gene test  2. Constipation -given list of OTC meds safe in pregnancy. Discussed increase fiber & water, take stool softeners. Can use laxative as needed.  -If abdominal pain worsens or fever develops, pt to go to ER   Initial labs drawn. Continue prenatal vitamins. Genetic Screening discussed, NIPS: ordered. Discussed AFP at next visit. Ultrasound discussed; fetal anatomic survey: ordered. Problem list reviewed and updated. The nature of [facility] with  multiple MDs and other Advanced Practice Providers was explained to patient; also emphasized that residents, students are part of our team. Routine obstetric precautions reviewed. Return in about 6 weeks (around 03/21/2018) for return at 20 wks for BRX scheduling, Routine OB.   Judeth Horn 10:59 AM 02/07/18

## 2018-02-07 NOTE — Progress Notes (Signed)
Addendum: 1:30pm PMH form completed.

## 2018-02-07 NOTE — Patient Instructions (Addendum)
Childbirth Education Options: [facility] Department Classes:  Childbirth education classes can help you get ready for a positive parenting experience. You can also meet other expectant parents and get free stuff for your baby. Each class runs for five weeks on the same night and costs $45 for the mother-to-be and her support person. Medicaid covers the cost if you are eligible. Call [phone] to register. [facility] Childbirth Education:  [phone] or [phone] or [email or web address]  Baby & Me Class: Discuss newborn & infant parenting and family adjustment issues with other new mothers in a relaxed environment. Each week brings a new speaker or baby-centered activity. We encourage new mothers to join [place] every Thursday at 11:00am. Babies birth until crawling. No registration or fee. Daddy [employer]: This course offers Dads-to-be the tools and knowledge needed to feel confident on their journey to becoming new fathers. Experienced dads, who have been trained as coaches, teach dads-to-be how to hold, comfort, diaper, swaddle and play with their infant while being able to support the new mom as well. A class for men taught by men. $25/dad Big Brother/Big Sister: Let your children share in the joy of a new brother or sister in this special class designed just for them. Class includes discussion about how families care for babies: swaddling, holding, diapering, safety as well as how they can be helpful in their new role. This class is designed for children ages 45 to 48, but any age is welcome. Please register each child individually. $5/child  Mom Talk: This mom-led group offers support and connection to mothers as they journey through the adjustments and struggles of that sometimes overwhelming first year after the birth of a child. Tuesdays at 10:00am and Thursdays at 6:00pm. Babies welcome. No registration or fee. Breastfeeding Support Group: This group is a mother-to-mother  support circle where moms have the opportunity to share their breastfeeding experiences. A Lactation Consultant is present for questions and concerns. Meets each Tuesday at 11:00am. No fee or registration. Breastfeeding Your Baby: Learn what to expect in the first days of breastfeeding your newborn.  This class will help you feel more confident with the skills needed to begin your breastfeeding experience. Many new mothers are concerned about breastfeeding after leaving the hospital. This class will also address the most common fears and challenges about breastfeeding during the first few weeks, months and beyond. (call for fee) Comfort Techniques and Tour: This 2 hour interactive class will provide you the opportunity to learn & practice hands-on techniques that can help relieve some of the discomfort of labor and encourage your baby to rotate toward the best position for birth. You and your partner will be able to try a variety of labor positions with birth balls and rebozos as well as practice breathing, relaxation, and visualization techniques. A tour of the [facility] is included with this class. $20 per registrant and support person Childbirth Class- Weekend Option: This class is a Weekend version of our Birth & Baby series. It is designed for parents who have a difficult time fitting several weeks of classes into their schedule. It covers the care of your newborn and the basics of labor and childbirth. It also includes a [place] of [facility] and lunch. The class is held two consecutive days: beginning on Friday evening from 6:30 - 8:30 p.m. and the next day, Saturday from 9 a.m. - 4 p.m. (call for fee) Doren Custard Class: Interested in a waterbirth?  This  informational class will help you discover whether waterbirth is the right fit for you. Education about waterbirth itself, supplies you would need and how to assemble your support team is what you can  expect from this class. Some obstetrical practices require this class in order to pursue a waterbirth. (Not all obstetrical practices offer waterbirth-check with your healthcare provider.) Register only the expectant mom, but you are encouraged to bring your partner to class! Required if planning waterbirth, no fee. Infant/Child CPR: Parents, grandparents, babysitters, and friends learn Cardio-Pulmonary Resuscitation skills for infants and children. You will also learn how to treat both conscious and unconscious choking in infants and children. This Family & Friends program does not offer certification. Register each participant individually to ensure that enough mannequins are available. (Call for fee) Grandparent Love: Expecting a grandbaby? This class is for you! Learn about the latest infant care and safety recommendations and ways to support your own child as he or she transitions into the parenting role. Taught by Registered Nurses who are childbirth instructors, but most importantly...they are grandmothers too! $10/person. Childbirth Class- Natural Childbirth: This series of 5 weekly classes is for expectant parents who want to learn and practice natural methods of coping with the process of labor and childbirth. Relaxation, breathing, massage, visualization, role of the partner, and helpful positioning are highlighted. Participants learn how to be confident in their body's ability to give birth. This class will empower and help parents make informed decisions about their own care. Includes discussion that will help new parents transition into the immediate postpartum period. Maternity Care Center Tour of [facility] is included. We suggest taking this class between 25-32 weeks, but it's only a recommendation. $75 per registrant and one support person or $30 Medicaid. Childbirth Class- 3 week Series: This option of 3 weekly classes helps you and your labor partner prepare for childbirth. Newborn  care, labor & birth, cesarean birth, pain management, and comfort techniques are discussed and a Maternity Care Center Tour of [facility] is included. The class meets at the same time, on the same day of the week for 3 consecutive weeks beginning with the starting date you choose. $60 for registrant and one support person.  Marvelous Multiples: Expecting twins, triplets, or more? This class covers the differences in labor, birth, parenting, and breastfeeding issues that face multiples' parents. NICU tour is included. Led by a Certified Childbirth Educator who is the mother of twins. No fee. Caring for Baby: This class is for expectant and adoptive parents who want to learn and practice the most up-to-date newborn care for their babies. Focus is on birth through the first six weeks of life. Topics include feeding, bathing, diapering, crying, umbilical cord care, circumcision care and safe sleep. Parents learn to recognize symptoms of illness and when to call the pediatrician. Register only the mom-to-be and your partner or support person can plan to come with you! $10 per registrant and support person Childbirth Class- online option: This online class offers you the freedom to complete a Birth and Baby series in the comfort of your own home. The flexibility of this option allows you to review sections at your own pace, at times convenient to you and your support people. It includes additional video information, animations, quizzes, and extended activities. Get organized with helpful eClass tools, checklists, and trackers. Once you register online for the class, you will receive an email within a few days to accept the invitation and begin the class when the time   is right for you. The content will be available to you for 60 days. $60 for 60 days of online access for you and your support people.  Local Doulas: Natural Baby Doulas [email or web address] Tel:  [phone] [email or web address] [employer] [phone] [email or web address] The Labor Hassell Halim  (also do waterbirth tub rental) [phone] [email or web address] [facility]ad Birth Doula [phone] [email or web address] Sacred Rhythms  [phone] [email or web address]/ [employer] (PADA) [email or web address] [place] Birth and Baby  [email or web address] Considering Waterbirth? Guide for patients at [facility] [employer]  Why consider waterbirth?  . Gentle birth for babies . Less pain medicine used in labor . May allow for passive descent/less pushing . May reduce perineal tears  . More mobility and instinctive maternal position changes . Increased maternal relaxation . Reduced blood pressure in labor  Is waterbirth safe? What are the risks of infection, drowning or other complications?  . Infection: o Very low risk (3.7 % for tub vs 4.8% for bed) o 7 in 8000 waterbirths with documented infection o Poorly cleaned equipment most common cause o Slightly lower group B strep transmission rate  . Drowning o Maternal:  - Very low risk   - Related to seizures or fainting o Newborn:  - Very low risk. No evidence of increased risk of respiratory problems in multiple large studies - Physiological protection from breathing under water - Avoid underwater birth if there are any fetal complications - Once baby's head is out of the water, keep it out.  . Birth complication o Some reports of cord trauma, but risk decreased by bringing baby to surface gradually o No evidence of increased risk of shoulder dystocia. Mothers can usually change positions faster in water than in a bed, possibly aiding the maneuvers to free the shoulder.   You must attend a Doren Custard class at [facility]  3rd Wednesday of every month from 7-9pm  [employer] by calling [phone] or online at [email or web address]  Bring [place] the certificate from the class to your prenatal appointment  Meet with a midwife at 36 weeks to see if you can still plan a waterbirth and to sign the consent.   Purchase or rent the following supplies:   Water Birth Pool (Birth Pool in a Box or [place] for instance)  (Tubs start ~$125)  Single-use disposable tub liner designed for your brand of tub  New garden hose labeled "lead-free", "suitable for drinking water",  Electric drain pump to remove water (We recommend 792 gallon per hour or greater pump.)   Separate garden hose to remove the dirty water  Fish net  Bathing suit top (optional)  Long-handled mirror (optional)  Places to purchase or rent supplies  GotWebTools.is for tub purchases and supplies  Waterbirthsolutions.com for tub purchases and supplies  The Labor Ladies (www.[email or web address]) $275 for tub rental/set-up & take down/kit   [employer] (http://www.fleming.com/.htm) Information regarding doulas (labor support) who provide pool rentals  Our practice has a Birth Pool in a Box tub at the hospital that you may borrow on a first-come-first-served basis. It is your responsibility to to set up, clean and break down the tub. We cannot guarantee the availability of this tub in advance. You are responsible for bringing all accessories listed above. If you do not have all necessary supplies you cannot have a waterbirth.    Things that would prevent you from having a waterbirth:  Premature, <37wks  Previous cesarean birth  Presence of thick meconium-stained fluid  Multiple gestation (Twins,  triplets, etc.)  Uncontrolled diabetes or gestational diabetes requiring medication  Hypertension requiring medication or diagnosis of pre-eclampsia  Heavy vaginal bleeding  Non-reassuring fetal  heart rate  Active infection (MRSA, etc.). Group B Strep is NOT a contraindication for  waterbirth.  If your labor has to be induced and induction method requires continuous  monitoring of the baby's heart rate  Other risks/issues identified by your obstetrical provider  Please remember that birth is unpredictable. Under certain unforeseeable circumstances your provider may advise against giving birth in the tub. These decisions will be made on a case-by-case basis and with the safety of you and your baby as our highest priority.    [email or web address]       Safe Medications in Pregnancy   Acne: Benzoyl Peroxide Salicylic Acid  Backache/Headache: Tylenol: 2 regular strength every 4 hours OR              2 Extra strength every 6 hours  Colds/Coughs/Allergies: Benadryl (alcohol free) 25 mg every 6 hours as needed Breath right strips Claritin Cepacol throat lozenges Chloraseptic throat spray Cold-Eeze- up to three times per day Cough drops, alcohol free Flonase (by prescription only) Guaifenesin Mucinex Robitussin DM (plain only, alcohol free) Saline nasal spray/drops Sudafed (pseudoephedrine) & Actifed ** use only after [redacted] weeks gestation and if you do not have high blood pressure Tylenol Vicks Vaporub Zinc lozenges Zyrtec   Constipation: Colace Ducolax suppositories Fleet enema Glycerin suppositories Metamucil Milk of magnesia Miralax Senokot Smooth move tea  Diarrhea: Kaopectate Imodium A-D  *NO pepto Bismol  Hemorrhoids: Anusol Anusol HC Preparation H Tucks  Indigestion: Tums Maalox Mylanta Zantac  Pepcid  Insomnia: Benadryl (alcohol free) '25mg'$  every 6 hours as needed Tylenol PM Unisom, no Gelcaps  Leg Cramps: Tums MagGel  Nausea/Vomiting:  Bonine Dramamine Emetrol Ginger extract Sea bands Meclizine  Nausea medication to take during pregnancy:  Unisom (doxylamine succinate 25 mg tablets) Take one tablet daily at bedtime.  If symptoms are not adequately controlled, the dose can be increased to a maximum recommended dose of two tablets daily (1/2 tablet in the morning, 1/2 tablet mid-afternoon and one at bedtime). Vitamin B6 '100mg'$  tablets. Take one tablet twice a day (up to 200 mg per day).  Skin Rashes: Aveeno products Benadryl cream or '25mg'$  every 6 hours as needed Calamine Lotion 1% cortisone cream  Yeast infection: Gyne-lotrimin 7 Monistat 7  Gum/tooth pain: Anbesol  **If taking multiple medications, please check labels to avoid duplicating the same active ingredients **take medication as directed on the label ** Do not exceed 4000 mg of tylenol in 24 hours **Do not take medications that contain aspirin or ibuprofen         Constipation, Adult Constipation is when a person has fewer bowel movements in a week than normal, has difficulty having a bowel movement, or has stools that are dry, hard, or larger than normal. Constipation may be caused by an underlying condition. It may become worse with age if a person takes certain medicines and does not take in enough fluids. Follow these instructions at home: Eating and drinking   Eat foods that have a lot of fiber, such as fresh fruits and vegetables, whole grains, and beans.  Limit foods that are high in fat, low in fiber, or overly processed, such as french fries, hamburgers, cookies, candies, and soda.  Drink enough fluid to keep your urine clear or pale yellow. General instructions  Exercise regularly or as told by your health care provider.  Go to the restroom when you have the urge to go. Do not hold it in.  Take over-the-counter and prescription medicines only as told by your health care provider. These include any fiber supplements.  Practice pelvic floor retraining exercises, such as deep breathing while relaxing the lower abdomen and pelvic floor relaxation during bowel movements.  Watch your condition for any changes.  Keep  all follow-up visits as told by your health care provider. This is important. Contact a health care provider if:  You have pain that gets worse.  You have a fever.  You do not have a bowel movement after 4 days.  You vomit.  You are not hungry.  You lose weight.  You are bleeding from the anus.  You have thin, pencil-like stools. Get help right away if:  You have a fever and your symptoms suddenly get worse.  You leak stool or have blood in your stool.  Your abdomen is bloated.  You have severe pain in your abdomen.  You feel dizzy or you faint. This information is not intended to replace advice given to you by your health care provider. Make sure you discuss any questions you have with your health care provider. Document Released: 05/26/2004 Document Revised: 03/17/2016 Document Reviewed: 02/16/2016 Elsevier Interactive Patient Education  2018 [employer].

## 2018-02-07 NOTE — Progress Notes (Signed)
Here for new ob. Signed up for Babyscripts optimization. Already has MyChart.

## 2018-02-09 LAB — URINE CULTURE, OB REFLEX

## 2018-02-09 LAB — CULTURE, OB URINE

## 2018-02-10 ENCOUNTER — Encounter: Payer: Self-pay | Admitting: Student

## 2018-02-11 ENCOUNTER — Encounter: Payer: Self-pay | Admitting: Student

## 2018-02-12 ENCOUNTER — Encounter: Payer: Self-pay | Admitting: Obstetrics and Gynecology

## 2018-02-14 DIAGNOSIS — Z3400 Encounter for supervision of normal first pregnancy, unspecified trimester: Secondary | ICD-10-CM

## 2018-02-14 LAB — HEMOGLOBINOPATHY EVALUATION
Ferritin: 20 ng/mL (ref 15–77)
HGB A2 QUANT: 2.3 % (ref 1.8–3.2)
HGB C: 0 %
HGB S: 0 %
HGB VARIANT: 0 %
Hgb A: 97.7 % (ref 96.4–98.8)
Hgb F Quant: 0 % (ref 0.0–2.0)
Hgb Solubility: NEGATIVE

## 2018-02-14 LAB — OBSTETRIC PANEL, INCLUDING HIV
ANTIBODY SCREEN: NEGATIVE
BASOS: 0 %
Basophils Absolute: 0 10*3/uL (ref 0.0–0.2)
EOS (ABSOLUTE): 0.1 10*3/uL (ref 0.0–0.4)
EOS: 2 %
HEMATOCRIT: 35.6 % (ref 34.0–46.6)
HEP B S AG: NEGATIVE
HIV SCREEN 4TH GENERATION: NONREACTIVE
Hemoglobin: 11.6 g/dL (ref 11.1–15.9)
Immature Grans (Abs): 0 10*3/uL (ref 0.0–0.1)
Immature Granulocytes: 0 %
LYMPHS ABS: 2 10*3/uL (ref 0.7–3.1)
Lymphs: 24 %
MCH: 26 pg — AB (ref 26.6–33.0)
MCHC: 32.6 g/dL (ref 31.5–35.7)
MCV: 80 fL (ref 79–97)
MONOCYTES: 8 %
Monocytes Absolute: 0.6 10*3/uL (ref 0.1–0.9)
Neutrophils Absolute: 5.5 10*3/uL (ref 1.4–7.0)
Neutrophils: 66 %
Platelets: 263 10*3/uL (ref 150–450)
RBC: 4.46 x10E6/uL (ref 3.77–5.28)
RDW: 15.7 % — ABNORMAL HIGH (ref 12.3–15.4)
RH TYPE: POSITIVE
RPR: NONREACTIVE
RUBELLA: 2.41 {index} (ref >0.99)
WBC: 8.2 10*3/uL (ref 3.4–10.8)

## 2018-02-14 LAB — SMN1 COPY NUMBER ANALYSIS (SMA CARRIER SCREENING)

## 2018-02-14 LAB — CYSTIC FIBROSIS GENE TEST

## 2018-02-15 ENCOUNTER — Encounter: Payer: Self-pay | Admitting: Student

## 2018-02-15 ENCOUNTER — Encounter: Payer: Self-pay | Admitting: *Deleted

## 2018-02-16 ENCOUNTER — Other Ambulatory Visit: Payer: Self-pay

## 2018-02-16 ENCOUNTER — Encounter (HOSPITAL_COMMUNITY): Payer: Self-pay

## 2018-02-16 ENCOUNTER — Inpatient Hospital Stay (HOSPITAL_COMMUNITY)
Admission: AD | Admit: 2018-02-16 | Discharge: 2018-02-16 | Disposition: A | Discharge status: Home / Self Care | Payer: Medicaid Other | Source: Ambulatory Visit | Attending: Obstetrics & Gynecology | Admitting: Obstetrics & Gynecology

## 2018-02-16 DIAGNOSIS — R42 Dizziness and giddiness: Secondary | ICD-10-CM | POA: Insufficient documentation

## 2018-02-16 DIAGNOSIS — R55 Syncope and collapse: Secondary | ICD-10-CM | POA: Diagnosis not present

## 2018-02-16 DIAGNOSIS — O26892 Other specified pregnancy related conditions, second trimester: Secondary | ICD-10-CM | POA: Diagnosis not present

## 2018-02-16 DIAGNOSIS — O9989 Other specified diseases and conditions complicating pregnancy, childbirth and the puerperium: Secondary | ICD-10-CM

## 2018-02-16 DIAGNOSIS — Z3A14 14 weeks gestation of pregnancy: Secondary | ICD-10-CM | POA: Diagnosis not present

## 2018-02-16 DIAGNOSIS — Z7722 Contact with and (suspected) exposure to environmental tobacco smoke (acute) (chronic): Secondary | ICD-10-CM | POA: Diagnosis not present

## 2018-02-16 LAB — URINALYSIS, ROUTINE W REFLEX MICROSCOPIC
Bilirubin Urine: NEGATIVE
GLUCOSE, UA: NEGATIVE mg/dL
KETONES UR: NEGATIVE mg/dL
Leukocytes, UA: NEGATIVE
NITRITE: NEGATIVE
PH: 7 (ref 5.0–8.0)
Protein, ur: NEGATIVE mg/dL
SPECIFIC GRAVITY, URINE: 1.014 (ref 1.005–1.030)

## 2018-02-16 LAB — CBC
HEMATOCRIT: 32.9 % — AB (ref 36.0–46.0)
HEMOGLOBIN: 11.1 g/dL — AB (ref 12.0–15.0)
MCH: 26.6 pg (ref 26.0–34.0)
MCHC: 33.7 g/dL (ref 30.0–36.0)
MCV: 78.9 fL (ref 78.0–100.0)
Platelets: 234 10*3/uL (ref 150–400)
RBC: 4.17 MIL/uL (ref 3.87–5.11)
RDW: 14.9 % (ref 11.5–15.5)
WBC: 8.9 10*3/uL (ref 4.0–10.5)

## 2018-02-16 NOTE — Discharge Instructions (Signed)

## 2018-02-16 NOTE — MAU Provider Note (Signed)
History     CSN: [id number]  Arrival date and time: 02/16/18 1402   First Provider Initiated Contact with Patient 02/16/18 1529      Chief Complaint  Patient presents with  . Abdominal Pain  . Dizziness   HPI   [name] is a 22 y.o. female G1P0000 @ 1813w6d here in MAU with episodes of dizziness. Says her dizziness has been present throughout her pregnancy. The dizziness worsens when she gets out of the shower and when she stands from sitting to standing. She has not passed out, however felt like she was going to pass out.  Says she drinks about 3 bottles of water per day and eats frequent snacks and healthy meals. Says she is not having any pain right now. Says if she lays on her side the pain in her abdomen goes away. She rates her pain 0/10  OB History    Gravida  1   Para  0   Term  0   Preterm  0   AB  0   Living  0     SAB  0   TAB  0   Ectopic  0   Multiple  0   Live Births              Past Medical History:  Diagnosis Date  . Asthma   . Depression     Past Surgical History:  Procedure Laterality Date  . NO PAST SURGERIES    . WISDOM TOOTH EXTRACTION      Family History  Problem Relation Age of Onset  . Hypertension Mother   . Heart disease Mother   . Thyroid disease Mother     Social History   Tobacco Use  . Smoking status: Passive Smoke Exposure - Never Smoker  . Smokeless tobacco: Never Used  Substance Use Topics  . Alcohol use: No  . Drug use: No    Allergies:  Allergies  Allergen Reactions  . Kiwi Extract Swelling  . Pineapple     Bumps and sores in mouth    Medications Prior to Admission  Medication Sig Dispense Refill Last Dose  . acetaminophen (TYLENOL) 325 MG tablet Take 325 mg by mouth every 6 (six) hours as needed.   Taking  . albuterol (PROVENTIL HFA;VENTOLIN HFA) 108 (90 BASE) MCG/ACT inhaler Inhale 3 puffs into the lungs every 4 (four) hours as needed for wheezing. 1 Inhaler 0 Taking  . Prenatal Vit-Fe  Fumarate-FA (MULTIVITAMIN-PRENATAL) 27-0.8 MG TABS tablet Take 1 tablet by mouth daily at 12 noon.   Taking   Results for orders placed or performed during the hospital encounter of 02/16/18 (from the past 48 hour(s))  Urinalysis, Routine w reflex microscopic     Status: Abnormal   Collection Time: 02/16/18  2:29 PM  Result Value Ref Range   Color, Urine YELLOW YELLOW   APPearance CLOUDY (A) CLEAR   Specific Gravity, Urine 1.014 1.005 - 1.030   pH 7.0 5.0 - 8.0   Glucose, UA NEGATIVE NEGATIVE mg/dL   Hgb urine dipstick SMALL (A) NEGATIVE   Bilirubin Urine NEGATIVE NEGATIVE   Ketones, ur NEGATIVE NEGATIVE mg/dL   Protein, ur NEGATIVE NEGATIVE mg/dL   Nitrite NEGATIVE NEGATIVE   Leukocytes, UA NEGATIVE NEGATIVE   RBC / HPF 11-20 0 - 5 RBC/hpf   Bacteria, UA RARE (A) NONE SEEN   Squamous Epithelial / LPF 11-20 0 - 5    Comment: Performed at [facility], [address]  [place], [place] [address]  CBC     Status: Abnormal   Collection Time: 02/16/18  3:06 PM  Result Value Ref Range   WBC 8.9 4.0 - 10.5 K/uL   RBC 4.17 3.87 - 5.11 MIL/uL   Hemoglobin 11.1 (L) 12.0 - 15.0 g/dL   HCT 60.4 (L) 54.0 - 98.1 %   MCV 78.9 78.0 - 100.0 fL   MCH 26.6 26.0 - 34.0 pg   MCHC 33.7 30.0 - 36.0 g/dL   RDW 19.1 47.8 - 29.5 %   Platelets 234 150 - 400 K/uL    Comment: Performed at [facility], [address], [place], [place] [address]   Review of Systems  Constitutional: Negative for fever.  Respiratory: Negative for shortness of breath.   Cardiovascular: Negative for chest pain.  Gastrointestinal: Negative for abdominal pain.  Genitourinary: Negative for vaginal bleeding.  Neurological: Positive for dizziness.   Physical Exam   Blood pressure (!) 106/54, pulse (!) 107, temperature 98.5 F (36.9 C), temperature source Oral, resp. rate 20, height 5\' 3"  (1.6 m), weight 123 lb 4 oz (55.9 kg), last menstrual period 11/04/2017, SpO2 100 %.  Physical Exam  Constitutional: She is  oriented to person, place, and time. She appears well-developed and well-nourished. No distress.  HENT:  Head: Normocephalic.  Eyes: Pupils are equal, round, and reactive to light.  Neck: Neck supple.  Cardiovascular: Normal rate.  Respiratory: Effort normal and breath sounds normal.  GI: Soft. She exhibits no distension. There is no tenderness. There is no rebound and no guarding.  Musculoskeletal: Normal range of motion.  Neurological: She is alert and oriented to person, place, and time.  Skin: Skin is warm. She is not diaphoretic.  Psychiatric: Her behavior is normal.   MAU Course  Procedures  None  MDM   Orthostatic vitals normal EKG normal sinus rhythm.  UA CBC  Assessment and Plan   A:  1. Postural dizziness with near syncope   2. [redacted] weeks gestation of pregnancy     P:  Discharge home in stable condition Follow up if symptoms worsen Follow up with OB Increase oral fluids, 6 bottles of water per day Frequent small meals If symptoms continue may need to see cardiology.   Venia Carbon I, NP 02/16/2018 4:02 PM

## 2018-02-16 NOTE — MAU Note (Signed)
Pt presents with c/o lower abdominal pain/cramping that began 1 week ago.  Denies VB.  Also reports that feels like she was going to pass out both yesterday & today, didn't pass out.

## 2018-02-18 ENCOUNTER — Encounter: Payer: Self-pay | Admitting: Student

## 2018-02-20 ENCOUNTER — Encounter: Payer: Self-pay | Admitting: Student

## 2018-02-27 ENCOUNTER — Encounter: Payer: Self-pay | Admitting: Student

## 2018-03-06 ENCOUNTER — Encounter (HOSPITAL_COMMUNITY): Payer: Self-pay

## 2018-03-13 ENCOUNTER — Ambulatory Visit (HOSPITAL_COMMUNITY)
Admission: RE | Admit: 2018-03-13 | Discharge: 2018-03-13 | Disposition: A | Discharge status: Home / Self Care | Payer: Medicaid Other | Source: Ambulatory Visit | Attending: Student | Admitting: Student

## 2018-03-13 ENCOUNTER — Other Ambulatory Visit: Payer: Self-pay | Admitting: Student

## 2018-03-13 DIAGNOSIS — Z3A18 18 weeks gestation of pregnancy: Secondary | ICD-10-CM | POA: Diagnosis not present

## 2018-03-13 DIAGNOSIS — Z3400 Encounter for supervision of normal first pregnancy, unspecified trimester: Secondary | ICD-10-CM

## 2018-03-13 DIAGNOSIS — Z363 Encounter for antenatal screening for malformations: Secondary | ICD-10-CM

## 2018-03-13 DIAGNOSIS — Z3689 Encounter for other specified antenatal screening: Secondary | ICD-10-CM

## 2018-03-19 ENCOUNTER — Other Ambulatory Visit (HOSPITAL_COMMUNITY): Payer: Self-pay | Admitting: *Deleted

## 2018-03-19 DIAGNOSIS — Z362 Encounter for other antenatal screening follow-up: Secondary | ICD-10-CM

## 2018-03-20 ENCOUNTER — Telehealth: Payer: Self-pay | Admitting: Licensed Clinical Social Worker

## 2018-03-20 NOTE — Telephone Encounter (Signed)
Pt confirmed appt

## 2018-03-22 ENCOUNTER — Ambulatory Visit (INDEPENDENT_AMBULATORY_CARE_PROVIDER_SITE_OTHER): Payer: Medicaid Other | Admitting: Obstetrics and Gynecology

## 2018-03-22 VITALS — BP 101/51 | HR 105 | Wt 128.9 lb

## 2018-03-22 DIAGNOSIS — Z3402 Encounter for supervision of normal first pregnancy, second trimester: Secondary | ICD-10-CM

## 2018-03-22 NOTE — Patient Instructions (Addendum)
Dizziness Dizziness is a common problem. It makes you feel unsteady or light-headed. You may feel like you are about to pass out (faint). Dizziness can lead to getting hurt if you stumble or fall. Dizziness can be caused by many things, including:  Medicines.  Not having enough water in your body (dehydration).  Illness.  Follow these instructions at home: Eating and drinking  Drink enough fluid to keep your pee (urine) clear or pale yellow. This helps to keep you from getting dehydrated. Try to drink more clear fluids, such as water.  Do not drink alcohol.  Limit how much caffeine you drink or eat, if your doctor tells you to do that.  Limit how much salt (sodium) you drink or eat, if your doctor tells you to do that. Activity  Avoid making quick movements. ? When you stand up from sitting in a chair, steady yourself until you feel okay. ? In the morning, first sit up on the side of the bed. When you feel okay, stand slowly while you hold onto something. Do this until you know that your balance is fine.  If you need to stand in one place for a long time, move your legs often. Tighten and relax the muscles in your legs while you are standing.  Do not drive or use heavy machinery if you feel dizzy.  Avoid bending down if you feel dizzy. Place items in your home so you can reach them easily without leaning over. Lifestyle  Do not use any products that contain nicotine or tobacco, such as cigarettes and e-cigarettes. If you need help quitting, ask your doctor.  Try to lower your stress level. You can do this by using methods such as yoga or meditation. Talk with your doctor if you need help. General instructions  Watch your dizziness for any changes.  Take over-the-counter and prescription medicines only as told by your doctor. Talk with your doctor if you think that you are dizzy because of a medicine that you are taking.  Tell a friend or a family member that you are feeling  dizzy. If he or she notices any changes in your behavior, have this person call your doctor.  Keep all follow-up visits as told by your doctor. This is important. Contact a doctor if:  Your dizziness does not go away.  Your dizziness or light-headedness gets worse.  You feel sick to your stomach (nauseous).  You have trouble hearing.  You have new symptoms.  You are unsteady on your feet.  You feel like the room is spinning. Get help right away if:  You throw up (vomit) or have watery poop (diarrhea), and you cannot eat or drink anything.  You have trouble: ? Talking. ? Walking. ? Swallowing. ? Using your arms, hands, or legs.  You feel generally weak.  You are not thinking clearly, or you have trouble forming sentences. A friend or family member may notice this.  You have: ? Chest pain. ? Pain in your belly (abdomen). ? Shortness of breath. ? Sweating.  Your vision changes.  You are bleeding.  You have a very bad headache.  You have neck pain or a stiff neck.  You have a fever. These symptoms may be an emergency. Do not wait to see if the symptoms will go away. Get medical help right away. Call your local emergency services (911 in the [place] Do not drive yourself to the hospital. Summary  Dizziness makes you feel unsteady or light-headed. You may  feel like you are about to pass out (faint).  Drink enough fluid to keep your pee (urine) clear or pale yellow. Do not drink alcohol.  Avoid making quick movements if you feel dizzy.  Watch your dizziness for any changes. This information is not intended to replace advice given to you by your health care provider. Make sure you discuss any questions you have with your health care provider. Document Released: 08/17/2011 Document Revised: 09/14/2016 Document Reviewed: 09/14/2016 Elsevier Interactive Patient Education  2017 [employer]     Places to have your son circumcised:     [facility] [phone] while you are in hospital  [facility] [phone] $244 by 4 wks  [facility] [phone] $175 by 2 wks  [facility] [phone] $250 by 7 days [facility] [phone] $269 by 4 wks  These prices sometimes change but are roughly what you can expect to pay. Please call and confirm pricing.   Circumcision is considered an elective/non-medically necessary procedure. There are many reasons parents decide to have their sons circumsized. During the first year of life circumcised males have a reduced risk of urinary tract infections but after this year the rates between circumcised males and uncircumcised males are the same.  It is safe to have your son circumcised outside of the hospital and the places above perform them regularly.   Deciding about Circumcision in Baby Boys  (Up-to-date The Basics)  What is circumcision?  Circumcision is a surgery that removes the skin that covers the tip of the penis, called the "foreskin" Circumcision is usually done when a boy is between 61 and 3810 days old. In the [place], circumcision is common. In some other countries, fewer boys are circumcised. Circumcision is a common tradition in some religions.  Should I have my baby boy circumcised?  There is no easy answer. Circumcision has some benefits. But it also has risks. After talking with your doctor, you will have to decide for yourself what is right for your family.  What are the benefits of circumcision?  Circumcised boys seem to have slightly lower rates of: ?Urinary tract infections ?Swelling of the opening at the tip of the penis Circumcised men seem to have slightly lower rates of: ?Urinary tract infections ?Swelling of the opening at the tip of the penis ?Penis cancer ?HIV and other infections that you  catch during sex ?Cervical cancer in the women they have sex with Even so, in the [place], the risks of these problems are small - even in boys and men who have not been circumcised. Plus, boys and men who are not circumcised can reduce these extra risks by: ?Cleaning their penis well ?Using condoms during sex  What are the risks of circumcision?  Risks include: ?Bleeding or infection from the surgery ?Damage to or amputation of the penis ?A chance that the doctor will cut off too much or not enough of the foreskin ?A chance that sex won't feel as good later in life Only about 1 out of every 200 circumcisions leads to problems. There is also a chance that your health insurance won't pay for circumcision.  How is circumcision done in baby boys?  First, the baby gets medicine for pain relief. This might be a cream on the skin or a shot into the base of the penis. Next, the doctor cleans the baby's penis well. Then he or she uses special tools to cut off the foreskin. Finally, the doctor wraps a bandage (called gauze) around the baby's  penis. If you have your baby circumcised, his doctor or nurse will give you instructions on how to care for him after the surgery. It is important that you follow those instructions carefully.
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
## 2018-04-01 ENCOUNTER — Encounter: Payer: Self-pay | Admitting: Student

## 2018-04-02 ENCOUNTER — Other Ambulatory Visit: Payer: Self-pay

## 2018-04-02 ENCOUNTER — Encounter: Payer: Self-pay | Admitting: Student

## 2018-04-02 ENCOUNTER — Emergency Department (HOSPITAL_COMMUNITY)
Admission: EM | Admit: 2018-04-02 | Discharge: 2018-04-02 | Disposition: A | Discharge status: Home / Self Care | Payer: Medicaid Other | Attending: Emergency Medicine | Admitting: Emergency Medicine

## 2018-04-02 ENCOUNTER — Encounter (HOSPITAL_COMMUNITY): Payer: Self-pay | Admitting: Emergency Medicine

## 2018-04-02 DIAGNOSIS — Y92410 Unspecified street and highway as the place of occurrence of the external cause: Secondary | ICD-10-CM | POA: Diagnosis not present

## 2018-04-02 DIAGNOSIS — Z7722 Contact with and (suspected) exposure to environmental tobacco smoke (acute) (chronic): Secondary | ICD-10-CM | POA: Insufficient documentation

## 2018-04-02 DIAGNOSIS — O9989 Other specified diseases and conditions complicating pregnancy, childbirth and the puerperium: Secondary | ICD-10-CM | POA: Insufficient documentation

## 2018-04-02 DIAGNOSIS — R109 Unspecified abdominal pain: Secondary | ICD-10-CM | POA: Insufficient documentation

## 2018-04-02 DIAGNOSIS — Y999 Unspecified external cause status: Secondary | ICD-10-CM | POA: Diagnosis not present

## 2018-04-02 DIAGNOSIS — Z3A21 21 weeks gestation of pregnancy: Secondary | ICD-10-CM | POA: Diagnosis not present

## 2018-04-02 DIAGNOSIS — Y939 Activity, unspecified: Secondary | ICD-10-CM | POA: Insufficient documentation

## 2018-04-02 LAB — COMPREHENSIVE METABOLIC PANEL
ALK PHOS: 64 U/L (ref 38–126)
ALT: 25 U/L (ref 0–44)
ANION GAP: 11 (ref 5–15)
AST: 32 U/L (ref 15–41)
Albumin: 3.1 g/dL — ABNORMAL LOW (ref 3.5–5.0)
BILIRUBIN TOTAL: 0.6 mg/dL (ref 0.3–1.2)
BUN: <5 mg/dL — ABNORMAL LOW (ref 6–20)
CALCIUM: 9.1 mg/dL (ref 8.9–10.3)
CO2: 21 mmol/L — ABNORMAL LOW (ref 22–32)
Chloride: 107 mmol/L (ref 98–111)
Creatinine, Ser: 0.64 mg/dL (ref 0.44–1.00)
GFR calc Af Amer: >60 mL/min (ref >60)
Glucose, Bld: 71 mg/dL (ref 70–99)
Potassium: 4.3 mmol/L (ref 3.5–5.1)
Sodium: 139 mmol/L (ref 135–145)
TOTAL PROTEIN: 6.9 g/dL (ref 6.5–8.1)

## 2018-04-02 LAB — URINALYSIS, ROUTINE W REFLEX MICROSCOPIC
Bilirubin Urine: NEGATIVE
Glucose, UA: NEGATIVE mg/dL
KETONES UR: 5 mg/dL — AB
Leukocytes, UA: NEGATIVE
Nitrite: NEGATIVE
PH: 7 (ref 5.0–8.0)
PROTEIN: NEGATIVE mg/dL
SPECIFIC GRAVITY, URINE: 1.011 (ref 1.005–1.030)

## 2018-04-02 LAB — CBC WITH DIFFERENTIAL/PLATELET
ABS IMMATURE GRANULOCYTES: 0 10*3/uL (ref 0.0–0.1)
Basophils Absolute: 0 10*3/uL (ref 0.0–0.1)
Basophils Relative: 0 %
EOS PCT: 3 %
Eosinophils Absolute: 0.2 10*3/uL (ref 0.0–0.7)
HCT: 35.2 % — ABNORMAL LOW (ref 36.0–46.0)
HEMOGLOBIN: 10.7 g/dL — AB (ref 12.0–15.0)
Immature Granulocytes: 0 %
LYMPHS PCT: 21 %
Lymphs Abs: 1.9 10*3/uL (ref 0.7–4.0)
MCH: 26.1 pg (ref 26.0–34.0)
MCHC: 30.4 g/dL (ref 30.0–36.0)
MCV: 85.9 fL (ref 78.0–100.0)
MONO ABS: 0.9 10*3/uL (ref 0.1–1.0)
MONOS PCT: 10 %
Neutro Abs: 6.1 10*3/uL (ref 1.7–7.7)
Neutrophils Relative %: 66 %
Platelets: 232 10*3/uL (ref 150–400)
RBC: 4.1 MIL/uL (ref 3.87–5.11)
RDW: 14.6 % (ref 11.5–15.5)
WBC: 9.3 10*3/uL (ref 4.0–10.5)

## 2018-04-02 LAB — LIPASE, BLOOD: LIPASE: 30 U/L (ref 11–51)

## 2018-04-02 MED ORDER — SODIUM CHLORIDE 0.9 % IV BOLUS
1000.0000 mL | Freq: Once | INTRAVENOUS | Status: AC
  Administered 2018-04-02: 1000 mL via INTRAVENOUS

## 2018-04-02 NOTE — Discharge Instructions (Signed)
Your exam and lab work today was overall reassuring.  The ultrasound of your baby showed a normal heart rate.  You had improving symptoms during the duration of your stay.  Based on all this, we felt you are safe for discharge home.  Please follow-up with your OB/GYN and PCP in the next several days.  If any symptoms change or worsen, please return to the nearest emergency department.
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
## 2018-04-02 NOTE — ED Notes (Signed)
Pt's mother to the desk, asking when her daughter will be discharged and where the doctor is. Pt's mother advised that pt is waiting for labs/urine and for MD to reassess and discharge the pt. Pt's mother stated "well I've just been discharged and she needs to be too". Dr. Rush Landmarkegeler aware.
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
## 2018-04-03 LAB — URINE CULTURE: Culture: <10000 — AB

## 2018-04-08 ENCOUNTER — Encounter: Payer: Self-pay | Admitting: Student

## 2018-04-10 ENCOUNTER — Ambulatory Visit (HOSPITAL_COMMUNITY)
Admission: RE | Admit: 2018-04-10 | Discharge: 2018-04-10 | Disposition: A | Discharge status: Home / Self Care | Payer: Medicaid Other | Source: Ambulatory Visit | Attending: Student | Admitting: Student

## 2018-04-10 ENCOUNTER — Other Ambulatory Visit (HOSPITAL_COMMUNITY): Payer: Self-pay | Admitting: Obstetrics and Gynecology

## 2018-04-10 DIAGNOSIS — Z362 Encounter for other antenatal screening follow-up: Secondary | ICD-10-CM | POA: Diagnosis not present

## 2018-04-10 DIAGNOSIS — Z3A22 22 weeks gestation of pregnancy: Secondary | ICD-10-CM | POA: Diagnosis present

## 2018-04-10 DIAGNOSIS — J45909 Unspecified asthma, uncomplicated: Secondary | ICD-10-CM

## 2018-04-10 DIAGNOSIS — O99512 Diseases of the respiratory system complicating pregnancy, second trimester: Secondary | ICD-10-CM

## 2018-04-22 ENCOUNTER — Ambulatory Visit (INDEPENDENT_AMBULATORY_CARE_PROVIDER_SITE_OTHER): Payer: Medicaid Other | Admitting: Clinical

## 2018-04-22 ENCOUNTER — Encounter: Payer: Self-pay | Admitting: Advanced Practice Midwife

## 2018-04-22 ENCOUNTER — Ambulatory Visit (INDEPENDENT_AMBULATORY_CARE_PROVIDER_SITE_OTHER): Payer: Medicaid Other | Admitting: Advanced Practice Midwife

## 2018-04-22 VITALS — BP 110/56 | HR 104 | Wt 132.0 lb

## 2018-04-22 DIAGNOSIS — F4323 Adjustment disorder with mixed anxiety and depressed mood: Secondary | ICD-10-CM | POA: Diagnosis not present

## 2018-04-22 DIAGNOSIS — Z3400 Encounter for supervision of normal first pregnancy, unspecified trimester: Secondary | ICD-10-CM
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
## 2018-04-22 NOTE — Progress Notes (Signed)
   PRENATAL VISIT NOTE  Subjective:  Beth Bray is a 22 y.o. G1P0000 at 4068w1d being seen today for ongoing prenatal care.  She is currently monitored for the following issues for this low-risk pregnancy and has DUB (dysfunctional uterine bleeding); Supervision of low-risk first pregnancy; and Constipation in female on their problem list.  Patient reports no complaints.  Contractions: Not present. Vag. Bleeding: None.  Movement: Present. Denies leaking of fluid.   The following portions of the patient's history were reviewed and updated as appropriate: allergies, current medications, past family history, past medical history, past social history, past surgical history and problem list. Problem list updated.  Objective:   Vitals:   04/22/18 1352  BP: (!) 110/56  Pulse: (!) 104  Weight: 132 lb (59.9 kg)    Fetal Status: Fetal Heart Rate (bpm): 158 Fundal Height: 25 cm Movement: Present     General:  Alert, oriented and cooperative. Patient is in no acute distress.  Skin: Skin is warm and dry. No rash noted.   Cardiovascular: Normal heart rate noted  Respiratory: Normal respiratory effort, no problems with respiration noted  Abdomen: Soft, gravid, appropriate for gestational age.  Pain/Pressure: Present     Pelvic: Cervical exam deferred        Extremities: Normal range of motion.  Edema: Trace  Mental Status: Normal mood and affect. Normal behavior. Normal judgment and thought content.   Assessment and Plan:  Pregnancy: G1P0000 at 4368w1d  1. Encounter for supervision of low-risk first pregnancy, antepartum  - CBC; Future - Glucose Tolerance, 2 Hours w/1 Hour; Future - HIV antibody; Future - RPR; Future  Preterm labor symptoms and general obstetric precautions including but not limited to vaginal bleeding, contractions, leaking of fluid and fetal movement were reviewed in detail with the patient. Please refer to After Visit Summary for other counseling recommendations.  Return  in about 4 weeks (around 05/20/2018) for GTT and 28 week labs at next visit .  No future appointments.  Thressa ShellerHeather Leighanna Kirn, CNM

## 2018-05-06 ENCOUNTER — Inpatient Hospital Stay (HOSPITAL_COMMUNITY)
Admission: AD | Admit: 2018-05-06 | Discharge: 2018-05-06 | Disposition: A | Discharge status: Home / Self Care | Payer: Medicaid Other | Source: Ambulatory Visit | Attending: Obstetrics and Gynecology | Admitting: Obstetrics and Gynecology

## 2018-05-06 ENCOUNTER — Encounter (HOSPITAL_COMMUNITY): Payer: Self-pay | Admitting: *Deleted

## 2018-05-06 DIAGNOSIS — Z8249 Family history of ischemic heart disease and other diseases of the circulatory system: Secondary | ICD-10-CM | POA: Diagnosis not present

## 2018-05-06 DIAGNOSIS — Z79899 Other long term (current) drug therapy: Secondary | ICD-10-CM | POA: Insufficient documentation

## 2018-05-06 DIAGNOSIS — Z3A26 26 weeks gestation of pregnancy: Secondary | ICD-10-CM | POA: Diagnosis not present

## 2018-05-06 DIAGNOSIS — R52 Pain, unspecified: Secondary | ICD-10-CM | POA: Diagnosis not present

## 2018-05-06 DIAGNOSIS — B9689 Other specified bacterial agents as the cause of diseases classified elsewhere: Secondary | ICD-10-CM | POA: Diagnosis not present

## 2018-05-06 DIAGNOSIS — O26892 Other specified pregnancy related conditions, second trimester: Secondary | ICD-10-CM | POA: Diagnosis not present

## 2018-05-06 DIAGNOSIS — J45909 Unspecified asthma, uncomplicated: Secondary | ICD-10-CM | POA: Insufficient documentation

## 2018-05-06 DIAGNOSIS — O99512 Diseases of the respiratory system complicating pregnancy, second trimester: Secondary | ICD-10-CM | POA: Insufficient documentation

## 2018-05-06 DIAGNOSIS — N760 Acute vaginitis: Secondary | ICD-10-CM | POA: Diagnosis not present

## 2018-05-06 DIAGNOSIS — R1030 Lower abdominal pain, unspecified: Secondary | ICD-10-CM | POA: Diagnosis present

## 2018-05-06 DIAGNOSIS — Z8349 Family history of other endocrine, nutritional and metabolic diseases: Secondary | ICD-10-CM | POA: Diagnosis not present

## 2018-05-06 DIAGNOSIS — O23592 Infection of other part of genital tract in pregnancy, second trimester: Secondary | ICD-10-CM | POA: Diagnosis not present

## 2018-05-06 LAB — URINALYSIS, ROUTINE W REFLEX MICROSCOPIC
Bilirubin Urine: NEGATIVE
Glucose, UA: NEGATIVE mg/dL
Ketones, ur: NEGATIVE mg/dL
Nitrite: NEGATIVE
PROTEIN: NEGATIVE mg/dL
Specific Gravity, Urine: 1.012 (ref 1.005–1.030)
pH: 8 (ref 5.0–8.0)

## 2018-05-06 LAB — WET PREP, GENITAL
Sperm: NONE SEEN
TRICH WET PREP: NONE SEEN
YEAST WET PREP: NONE SEEN

## 2018-05-06 MED ORDER — COMFORT FIT MATERNITY SUPP SM MISC: 0 refills | Status: DC

## 2018-05-06 MED ORDER — METRONIDAZOLE 500 MG PO TABS: 500.0000 mg | ORAL_TABLET | Freq: Two times a day (BID) | ORAL | 0 refills | Status: AC

## 2018-05-06 NOTE — MAU Provider Note (Signed)
History     CSN: [id number]  Arrival date and time: 05/06/18 1010   First Provider Initiated Contact with Patient 05/06/18 1120      Chief Complaint  Patient presents with  . Abdominal Pain  . Decreased Fetal Movement   HPI Beth Bray 22 y.o. 669w1d  Hainvg lower abdominal pain constantly for 2 weeks.  Called and the clinic said to come to MAU for evaluation.    OB History    Gravida  1   Para  0   Term  0   Preterm  0   AB  0   Living  0     SAB  0   TAB  0   Ectopic  0   Multiple  0   Live Births              Past Medical History:  Diagnosis Date  . Asthma   . Depression     Past Surgical History:  Procedure Laterality Date  . NO PAST SURGERIES    . WISDOM TOOTH EXTRACTION      Family History  Problem Relation Age of Onset  . Hypertension Mother   . Heart disease Mother   . Thyroid disease Mother     Social History   Tobacco Use  . Smoking status: Passive Smoke Exposure - Never Smoker  . Smokeless tobacco: Never Used  Substance Use Topics  . Alcohol use: No  . Drug use: No    Allergies:  Allergies  Allergen Reactions  . Kiwi Extract Swelling  . Pineapple Other (See Comments)    Bumps and sores in mouth    Medications Prior to Admission  Medication Sig Dispense Refill Last Dose  . acetaminophen (TYLENOL) 325 MG tablet Take 325 mg by mouth every 6 (six) hours as needed.   Taking  . albuterol (PROVENTIL HFA;VENTOLIN HFA) 108 (90 BASE) MCG/ACT inhaler Inhale 3 puffs into the lungs every 4 (four) hours as needed for wheezing. 1 Inhaler 0 Taking  . Prenatal Vit-Fe Fumarate-FA (MULTIVITAMIN-PRENATAL) 27-0.8 MG TABS tablet Take 1 tablet by mouth daily at 12 noon.   Taking    Review of Systems  Constitutional: Negative for fever.  Gastrointestinal: Positive for abdominal pain. Negative for constipation, nausea and vomiting.  Genitourinary: Negative for dyspareunia, vaginal bleeding and vaginal discharge.   Physical Exam    Blood pressure (!) 108/58, pulse 97, temperature 98.6 F (37 C), resp. rate 16, height 5\' 3"  (1.6 m), weight 59.9 kg, last menstrual period 11/04/2017.  Physical Exam  Nursing note and vitals reviewed. Constitutional: She is oriented to person, place, and time. She appears well-developed and well-nourished.  HENT:  Head: Normocephalic.  Eyes: EOM are normal.  Neck: Neck supple.  GI: Soft. There is no tenderness. There is no rebound and no guarding.  FHT 140 baseline with moderate variability and 10x10 accels noted.  One quick variable decel noted with rapid resolution.  Baby hard to trace.  Mother reports good fetal movement.  one contractions seen.  Reassuring strip.    Genitourinary:  Genitourinary Comments: Speculum exam: Vagina - Small amount of white discharge, no odor Cervix - No contact bleeding Bimanual exam: Cervix closed and long GC/Chlam, wet prep done Chaperone present for exam.   Musculoskeletal: Normal range of motion.  Neurological: She is alert and oriented to person, place, and time.  Skin: Skin is warm and dry.  Psychiatric: She has a normal mood and affect.    MAU Course  Procedures Results for orders placed or performed during the hospital encounter of 05/06/18 (from the past 24 hour(s))  Urinalysis, Routine w reflex microscopic     Status: Abnormal   Collection Time: 05/06/18 10:31 AM  Result Value Ref Range   Color, Urine YELLOW YELLOW   APPearance CLEAR CLEAR   Specific Gravity, Urine 1.012 1.005 - 1.030   pH 8.0 5.0 - 8.0   Glucose, UA NEGATIVE NEGATIVE mg/dL   Hgb urine dipstick SMALL (A) NEGATIVE   Bilirubin Urine NEGATIVE NEGATIVE   Ketones, ur NEGATIVE NEGATIVE mg/dL   Protein, ur NEGATIVE NEGATIVE mg/dL   Nitrite NEGATIVE NEGATIVE   Leukocytes, UA TRACE (A) NEGATIVE   RBC / HPF 6-10 0 - 5 RBC/hpf   WBC, UA 0-5 0 - 5 WBC/hpf   Bacteria, UA RARE (A) NONE SEEN   Squamous Epithelial / LPF 6-10 0 - 5   Mucus PRESENT   Wet prep, genital      Status: Abnormal   Collection Time: 05/06/18 11:18 AM  Result Value Ref Range   Yeast Wet Prep HPF POC NONE SEEN NONE SEEN   Trich, Wet Prep NONE SEEN NONE SEEN   Clue Cells Wet Prep HPF POC PRESENT (A) NONE SEEN   WBC, Wet Prep HPF POC MANY (A) NONE SEEN   Sperm NONE SEEN     MDM Urine culture done to rule out possibility of UTI  Assessment and Plan  Bacterial vaginosis Normal aches and pains of pregnancy   Plan Advised child birth classes. Advised being in swimming pool for gentle exercise of legs and arms. Return if your pain worsens or if you have vaginal bleeding or leaking of fluid. Be seen in the clinic every 2 weeks  - next appointment is Sept. 11  Terri L Burleson 05/06/2018, 11:31 AM

## 2018-05-06 NOTE — MAU Note (Signed)
Pt presents to MAU with complaints of abdominal cramping for a couple of weeks. Reports a decrease in fetal movement since yesterday. States she is really stressed FOB shot last month . Denies any VB at present

## 2018-05-06 NOTE — Discharge Instructions (Signed)
Childbirth Education Options: [facility] Department Classes:  Childbirth education classes can help you get ready for a positive parenting experience. You can also meet other expectant parents and get free stuff for your baby. Each class runs for five weeks on the same night and costs $45 for the mother-to-be and her support person. Medicaid covers the cost if you are eligible. Call [phone] to register. [facility] Childbirth Education:  [phone] or [phone] or [email or web address]  Baby & Me Class: Discuss newborn & infant parenting and family adjustment issues with other new mothers in a relaxed environment. Each week brings a new speaker or baby-centered activity. We encourage new mothers to join [place] every Thursday at 11:00am. Babies birth until crawling. No registration or fee. Daddy [employer]: This course offers Dads-to-be the tools and knowledge needed to feel confident on their journey to becoming new fathers. Experienced dads, who have been trained as coaches, teach dads-to-be how to hold, comfort, diaper, swaddle and play with their infant while being able to support the new mom as well. A class for men taught by men. $25/dad Big Brother/Big Sister: Let your children share in the joy of a new brother or sister in this special class designed just for them. Class includes discussion about how families care for babies: swaddling, holding, diapering, safety as well as how they can be helpful in their new role. This class is designed for children ages 41 to 76, but any age is welcome. Please register each child individually. $5/child  Mom Talk: This mom-led group offers support and connection to mothers as they journey through the adjustments and struggles of that sometimes overwhelming first year after the birth of a child. Tuesdays at 10:00am and Thursdays at 6:00pm. Babies welcome. No registration or fee. Breastfeeding Support Group: This group is a mother-to-mother  support circle where moms have the opportunity to share their breastfeeding experiences. A Lactation Consultant is present for questions and concerns. Meets each Tuesday at 11:00am. No fee or registration. Breastfeeding Your Baby: Learn what to expect in the first days of breastfeeding your newborn.  This class will help you feel more confident with the skills needed to begin your breastfeeding experience. Many new mothers are concerned about breastfeeding after leaving the hospital. This class will also address the most common fears and challenges about breastfeeding during the first few weeks, months and beyond. (call for fee) Comfort Techniques and Tour: This 2 hour interactive class will provide you the opportunity to learn & practice hands-on techniques that can help relieve some of the discomfort of labor and encourage your baby to rotate toward the best position for birth. You and your partner will be able to try a variety of labor positions with birth balls and rebozos as well as practice breathing, relaxation, and visualization techniques. A tour of the [facility] is included with this class. $20 per registrant and support person Childbirth Class- Weekend Option: This class is a Weekend version of our Birth & Baby series. It is designed for parents who have a difficult time fitting several weeks of classes into their schedule. It covers the care of your newborn and the basics of labor and childbirth. It also includes a [facility] and lunch. The class is held two consecutive days: beginning on Friday evening from 6:30 - 8:30 p.m. and the next day, Saturday from 9 a.m. - 4 p.m. (call for fee) Doren Custard Class: Interested in a waterbirth?  This  informational class will help you discover whether waterbirth is the right fit for you. Education about waterbirth itself, supplies you would need and how to assemble your support team is what you can  expect from this class. Some obstetrical practices require this class in order to pursue a waterbirth. (Not all obstetrical practices offer waterbirth-check with your healthcare provider.) Register only the expectant mom, but you are encouraged to bring your partner to class! Required if planning waterbirth, no fee. °Infant/Child CPR: Parents, grandparents, babysitters, and friends learn Cardio-Pulmonary Resuscitation skills for infants and children. You will also learn how to treat both conscious and unconscious choking in infants and children. This Family & Friends program does not offer certification. Register each participant individually to ensure that enough mannequins are available. (Call for fee) °Grandparent Love: Expecting a grandbaby? This class is for you! Learn about the latest infant care and safety recommendations and ways to support your own child as he or she transitions into the parenting role. Taught by Registered Nurses who are childbirth instructors, but most importantly...they are grandmothers too! $10/person. °Childbirth Class- Natural Childbirth: This series of 5 weekly classes is for expectant parents who want to learn and practice natural methods of coping with the process of labor and childbirth. Relaxation, breathing, massage, visualization, role of the partner, and helpful positioning are highlighted. Participants learn how to be confident in their body's ability to give birth. This class will empower and help parents make informed decisions about their own care. Includes discussion that will help new parents transition into the immediate postpartum period. Maternity Care Center Tour of [facility] is included. We suggest taking this class between 25-32 weeks, but it's only a recommendation. $75 per registrant and one support person or $30 Medicaid. °Childbirth Class- 3 week Series: This option of 3 weekly classes helps you and your labor partner prepare for childbirth. Newborn  care, labor & birth, cesarean birth, pain management, and comfort techniques are discussed and a Maternity Care Center Tour of [facility] is included. The class meets at the same time, on the same day of the week for 3 consecutive weeks beginning with the starting date you choose. $60 for registrant and one support person.  °Marvelous Multiples: Expecting twins, triplets, or more? This class covers the differences in labor, birth, parenting, and breastfeeding issues that face multiples’ parents. NICU tour is included. Led by a Certified Childbirth Educator who is the mother of twins. No fee. °Caring for Baby: This class is for expectant and adoptive parents who want to learn and practice the most up-to-date newborn care for their babies. Focus is on birth through the first six weeks of life. Topics include feeding, bathing, diapering, crying, umbilical cord care, circumcision care and safe sleep. Parents learn to recognize symptoms of illness and when to call the pediatrician. Register only the mom-to-be and your partner or support person can plan to come with you! $10 per registrant and support person °Childbirth Class- online option: This online class offers you the freedom to complete a Birth and Baby series in the comfort of your own home. The flexibility of this option allows you to review sections at your own pace, at times convenient to you and your support people. It includes additional video information, animations, quizzes, and extended activities. Get organized with helpful eClass tools, checklists, and trackers. Once you register online for the class, you will receive an email within a few days to accept the invitation and begin the class when the time   is right for you. The content will be available to you for 60 days. $60 for 60 days of online access for you and your support people. ° °Local Doulas: °[employer] °[email or web address] °Tel:  [phone] °[email or web address] °[facility] °336-448-4114 °[email or web address] °[email or web address] °[employer]  °(also do waterbirth tub rental) °336-515-0240 °[email or web address] °[email or web address] °[facility] °336-312-4678 °[email or web address] °[email or web address] °[employer]  °336-239-2124 °[email or web address]://[email or web address] °[place] [employer] ([employer]) °[email or web address] °http://www.padanc.org/index.htm °[facility]  °[email or web address]://[email or web address] °Considering Waterbirth? °Guide for patients at [facility] ° °Why consider waterbirth? ° °• Gentle birth for babies °• Less pain medicine used in labor °• May allow for passive descent/less pushing °• May reduce perineal tears  °• More mobility and instinctive maternal position changes °• Increased maternal relaxation °• Reduced blood pressure in labor ° °Is waterbirth safe? What are the risks of infection, drowning or other complications? ° °• Infection: °o Very low risk (3.7 % for tub vs 4.8% for bed) °o 7 in 8000 waterbirths with documented infection °o Poorly cleaned equipment most common cause °o Slightly lower group B strep transmission rate ° °• Drowning °o Maternal:  °- Very low risk   °- Related to seizures or fainting °o Newborn:  °- Very low risk. No evidence of increased risk of respiratory problems in multiple large studies °- Physiological protection from breathing under water °- Avoid underwater birth if there are any fetal complications °- Once baby’s head is out of the water, keep it out. ° °• Birth complication °o Some reports of cord trauma, but risk decreased by bringing baby to surface gradually °o No evidence of increased risk of shoulder dystocia. Mothers can usually change positions faster in water than in a bed, possibly aiding the maneuvers to free the shoulder. ° ° °You must attend a Waterbirth class at [facility] °· 3rd Wednesday of every month from 7-9pm °· Free °· Register by calling [phone] or online at [email or web address] °· Bring us the certificate from the class to your prenatal appointment ° °Meet with a midwife at 36 weeks to see if you can still plan a waterbirth and to sign the consent.  ° °Purchase or rent the following supplies:  °· Water Birth Pool (Birth Pool in a Box or [employer] for instance)  (Tubs start ~$125) °· Single-use disposable tub liner designed for your brand of tub °· New garden hose labeled "lead-free", “suitable for drinking water", °· Electric drain pump to remove water (We recommend 792 gallon per hour or greater pump.)  °· Separate garden hose to remove the dirty water °· Fish net °· Bathing suit top (optional) °· Long-handled mirror (optional) ° °Places to purchase or rent supplies °· [email or web address] for tub purchases and supplies °· Waterbirthsolutions.com for tub purchases and supplies °· [employer] (www.[email or web address]) $275 for tub rental/set-up & take down/kit  °· [place] [employer] (http://www.padanc.org/MeetUs.htm) Information regarding doulas (labor support) who provide pool rentals °· Our practice has a Birth Pool in a Box tub at the hospital that you may borrow on a first-come-first-served basis. It is your responsibility to to set up, clean and break down the tub. We cannot guarantee the availability of this tub in advance. You are responsible for bringing all accessories listed above. If you do not have all necessary supplies you cannot have a waterbirth.   ° °Things that would prevent you from having a waterbirth: °· Premature, <37wks °· Previous cesarean birth °· Presence of thick meconium-stained fluid °· Multiple gestation (Twins,   triplets, etc.)  Uncontrolled diabetes or gestational diabetes requiring medication  Hypertension requiring medication or diagnosis of pre-eclampsia  Heavy vaginal bleeding  Non-reassuring fetal  heart rate  Active infection (MRSA, etc.). Group B Strep is NOT a contraindication for  waterbirth.  If your labor has to be induced and induction method requires continuous  monitoring of the baby's heart rate  Other risks/issues identified by your obstetrical provider  Please remember that birth is unpredictable. Under certain unforeseeable circumstances your provider may advise against giving birth in the tub. These decisions will be made on a case-by-case basis and with the safety of you and your baby as our highest priority.

## 2018-05-07 LAB — GC/CHLAMYDIA PROBE AMP (~~LOC~~) NOT AT ARMC
Chlamydia: NEGATIVE
Neisseria Gonorrhea: NEGATIVE

## 2018-05-07 LAB — CULTURE, OB URINE: Culture: <10000 — AB

## 2018-05-22 ENCOUNTER — Other Ambulatory Visit: Payer: Self-pay | Admitting: Advanced Practice Midwife

## 2018-05-22 ENCOUNTER — Ambulatory Visit (INDEPENDENT_AMBULATORY_CARE_PROVIDER_SITE_OTHER): Payer: Medicaid Other | Admitting: Student

## 2018-05-22 ENCOUNTER — Other Ambulatory Visit: Payer: Medicaid Other

## 2018-05-22 ENCOUNTER — Ambulatory Visit: Payer: Self-pay | Admitting: Clinical

## 2018-05-22 VITALS — BP 100/58 | HR 98 | Wt 133.0 lb

## 2018-05-22 DIAGNOSIS — Z23 Encounter for immunization: Secondary | ICD-10-CM | POA: Diagnosis not present

## 2018-05-22 DIAGNOSIS — O219 Vomiting of pregnancy, unspecified: Secondary | ICD-10-CM

## 2018-05-22 DIAGNOSIS — R12 Heartburn: Secondary | ICD-10-CM | POA: Diagnosis not present

## 2018-05-22 DIAGNOSIS — Z3403 Encounter for supervision of normal first pregnancy, third trimester: Secondary | ICD-10-CM | POA: Diagnosis present

## 2018-05-22 DIAGNOSIS — Z3400 Encounter for supervision of normal first pregnancy, unspecified trimester: Secondary | ICD-10-CM

## 2018-05-22 DIAGNOSIS — O26893 Other specified pregnancy related conditions, third trimester: Secondary | ICD-10-CM

## 2018-05-22 MED ORDER — RANITIDINE HCL 150 MG PO TABS
150.0000 mg | ORAL_TABLET | Freq: Two times a day (BID) | ORAL | 0 refills | Status: DC
Start: 2018-05-22 — End: 2018-08-15

## 2018-05-22 MED ORDER — METOCLOPRAMIDE HCL 10 MG PO TABS: 10.0000 mg | ORAL_TABLET | Freq: Three times a day (TID) | ORAL | 0 refills | Status: DC | PRN

## 2018-05-22 NOTE — Patient Instructions (Addendum)
Heartburn During Pregnancy Heartburn is a type of pain or discomfort that can happen in the throat or chest. It is often described as a burning sensation. Heartburn is common during pregnancy because:  A hormone (progesterone) that is released during pregnancy may relax the valve (lower esophageal sphincter, or LES) that separates the esophagus from the stomach. This allows stomach acid to move up into the esophagus, causing heartburn.  The uterus gets larger and pushes up on the stomach, which pushes more acid into the esophagus. This is especially true in the later stages of pregnancy.  Heartburn usually goes away or gets better after giving birth. What are the causes? Heartburn is caused by stomach acid backing up into the esophagus (reflux). Reflux can be triggered by:  Changing hormone levels.  Large meals.  Certain foods and beverages, such as coffee, chocolate, onions, and peppermint.  Exercise.  Increased stomach acid production.  What increases the risk? You are more likely to experience heartburn during pregnancy if you:  Had heartburn prior to becoming pregnant.  Have been pregnant more than once before.  Are overweight or obese.  The likelihood that you will get heartburn also increases as you get farther along in your pregnancy, especially during the last trimester. What are the signs or symptoms? Symptoms of this condition include:  Burning pain in the chest or lower throat.  Bitter taste in the mouth.  Coughing.  Problems swallowing.  Vomiting.  Hoarse voice.  Asthma.  Symptoms may get worse when you lie down or bend over. Symptoms are often worse at night. How is this diagnosed? This condition is diagnosed based on:  Your medical history.  Your symptoms.  Blood tests to check for a certain type of bacteria associated with heartburn.  Whether taking heartburn medicine relieves your symptoms.  Examination of the stomach and esophagus using a  tube with a light and camera on the end (endoscopy).  How is this treated? Treatment varies depending on how severe your symptoms are. Your health care provider may recommend:  Over-the-counter medicines (antacids or acid reducers) for mild heartburn.  Prescription medicines to decrease stomach acid or to protect your stomach lining.  Certain changes in your diet.  Raising the head of your bed so it is higher than the foot of the bed. This helps prevent stomach acid from backing up into the esophagus when you are lying down.  Follow these instructions at home: Eating and drinking  Do not drink alcohol during your pregnancy.  Identify foods and beverages that make your symptoms worse, and avoid them.  Beverages that you may want to avoid include: ? Coffee and tea (with or without caffeine). ? Energy drinks and sports drinks. ? Carbonated drinks or sodas. ? Citrus fruit juices.  Foods that you may want to avoid include: ? Chocolate and cocoa. ? Peppermint and mint flavorings. ? Garlic, onions, and horseradish. ? Spicy and acidic foods, including peppers, chili powder, curry powder, vinegar, hot sauces, and barbecue sauce. ? Citrus fruits, such as oranges, lemons, and limes. ? Tomato-based foods, such as red sauce, chili, and salsa. ? Fried and fatty foods, such as donuts, french fries, potato chips, and high-fat dressings. ? High-fat meats, such as hot dogs, cold cuts, sausage, ham, and bacon. ? High-fat dairy items, such as whole milk, butter, and cheese.  Eat small, frequent meals instead of large meals.  Avoid drinking large amounts of liquid with your meals.  Avoid eating meals during the 2-3 hours before  bedtime.  Avoid lying down right after you eat.  Do not exercise right after you eat. Medicines  Take over-the-counter and prescription medicines only as told by your health care provider.  Do not take aspirin, ibuprofen, or other NSAIDs unless your health care  provider tells you to do that.  You may be instructed to avoid medicines that contain sodium bicarbonate. General instructions  If directed, raise the head of your bed about 6 inches (15 cm) by putting blocks under the legs. Sleeping with more pillows does not effectively relieve heartburn because it only changes the position of your head.  Do not use any products that contain nicotine or tobacco, such as cigarettes and e-cigarettes. If you need help quitting, ask your health care provider.  Wear loose-fitting clothing.  Try to reduce your stress, such as with yoga or meditation. If you need help managing stress, ask your health care provider.  Maintain a healthy weight. If you are overweight, work with your health care provider to safely lose weight.  Keep all follow-up visits as told by your health care provider. This is important. Contact a health care provider if:  You develop new symptoms.  Your symptoms do not improve with treatment.  You have unexplained weight loss.  You have difficulty swallowing.  You make loud sounds when you breathe (wheeze).  You have a cough that does not go away.  You have frequent heartburn for more than 2 weeks.  You have nausea or vomiting that does not get better with treatment.  You have pain in your abdomen. Get help right away if:  You have severe chest pain that spreads to your arm, neck, or jaw.  You feel sweaty, dizzy, or light-headed.  You have shortness of breath.  You have pain when swallowing.  You vomit, and your vomit looks like blood or coffee grounds.  Your stool is bloody or black. This information is not intended to replace advice given to you by your health care provider. Make sure you discuss any questions you have with your health care provider. Document Released: 08/25/2000 Document Revised: 05/15/2016 Document Reviewed: 05/15/2016 Elsevier Interactive Patient Education  2018 [employer].       AREA  PEDIATRIC/FAMILY PRACTICE PHYSICIANS  [facility] [address] [place], [place]  [address] Phone - [phone]   Fax - [phone]  [facility] [place] [facility] [place] [facility] [address] [place], [place] [address] Phone - [phone]   Fax - [phone]  JACK AMOS [address] [place], [place]  [address] Phone - [phone]   Fax - [phone]  [facility] [address] [place], [place]  [address] Phone - [phone]   Fax - [phone]  [facility] [address] [place], [place]  [address] Phone - [phone]   Fax - [phone]  [facility] [address] [place], [place]  [address] Phone - [phone]   Fax - [phone]  [facility] [place] [address] [place], [place]  [address] Phone - [phone]   Fax - [phone]  [facility] [address] [place] [place], [place]  [address] Phone - [phone]   Fax - [phone]  [facility] [address] [place], [place]  [address] Phone - [phone]   Fax - [phone] [facility] [place] [address] [place], [place]  [address] Phone - [phone]  Fax - [phone]  [facility] [address] [place], [place]  [address] Phone - [phone]   Fax - [phone]  [facility] [address], [address] [place], [place]  [address] Phone - [phone]   Fax - [phone]  [facility] [place] [address] [place], [place]  [address] Phone - [phone]   Fax - [phone]  [facility] [address] [place], [place]  [address] Phone - [phone]  [facility] [address] [place], [place]  [address] Phone - [phone]   Fax - [phone]  [facility] [address] [place], [place]  [address] Phone - [phone]   Fax - [phone]  [facility] [address] [place], [place]  [address] Phone - [phone]   Fax - [phone]  [facility] [address] [place], [place]  [address] Phone - [phone]   Fax - [phone]   [facility] [address] [place], [place]  [address] Phone - [phone]   Fax - [phone]  [facility] [address] [place], [place]  [address] Phone - [phone]   Fax - [phone]  DAVID RUBIN [address] [place], [place]  [address] Phone - [phone]   Fax - [phone]  [facility] [address] [place], [place]  [address] Phone - [phone]   Fax - [phone]  [place] [facility] Alita Chyle [address] [place], [place]  [address] Phone - [phone]   Fax - [phone] Gerarda Fraction [address] [place], [place]  [address] Phone - [phone]   Fax - [phone]  [facility] [address] [place], [place]  [address] Phone - [phone]   Fax - [phone]  [facility] [place] [address] [place], [place]  [address] Phone - [phone]   Fax - [phone]  [facility] [place] Wyvonne Lenz MD [address] [place] [address] Phone [phone]  Fax [phone]

## 2018-05-22 NOTE — Progress Notes (Signed)
   PRENATAL VISIT NOTE  Subjective:  Beth Bray is a 22 y.o. G1P0000 at [redacted]w[redacted]d being seen today for ongoing prenatal care.  She is currently monitored for the following issues for this low-risk pregnancy and has DUB (dysfunctional uterine bleeding); Supervision of low-risk first pregnancy; and Constipation in female on their problem list.  Patient reports heartburn and nausea.  Contractions: Not present. Vag. Bleeding: None.  Movement: Present. Denies leaking of fluid.   The following portions of the patient's history were reviewed and updated as appropriate: allergies, current medications, past family history, past medical history, past social history, past surgical history and problem list. Problem list updated.  Objective:   Vitals:   05/22/18 0838  BP: (!) 100/58  Pulse: 98  Weight: 133 lb (60.3 kg)    Fetal Status: Fetal Heart Rate (bpm): 152 Fundal Height: 27 cm Movement: Present     General:  Alert, oriented and cooperative. Patient is in no acute distress.  Skin: Skin is warm and dry. No rash noted.   Cardiovascular: Normal heart rate noted  Respiratory: Normal respiratory effort, no problems with respiration noted  Abdomen: Soft, gravid, appropriate for gestational age.  Pain/Pressure: Present     Pelvic: Cervical exam deferred        Extremities: Normal range of motion.  Edema: Trace  Mental Status: Normal mood and affect. Normal behavior. Normal judgment and thought content.   Assessment and Plan:  Pregnancy: G1P0000 at [redacted]w[redacted]d  1. Encounter for supervision of low-risk first pregnancy, antepartum  - Flu Vaccine QUAD 36+ mos IM  2. Need for Tdap vaccination  - Tdap vaccine greater than or equal to 7yo IM  3. Nausea and vomiting during pregnancy  - metoCLOPramide (REGLAN) 10 MG tablet; Take 1 tablet (10 mg total) by mouth every 8 (eight) hours as needed for nausea.  Dispense: 30 tablet; Refill: 0  4. Heartburn during pregnancy in third trimester  - ranitidine  (ZANTAC) 150 MG tablet; Take 1 tablet (150 mg total) by mouth 2 (two) times daily.  Dispense: 60 tablet; Refill: 0  Preterm labor symptoms and general obstetric precautions including but not limited to vaginal bleeding, contractions, leaking of fluid and fetal movement were reviewed in detail with the patient. Please refer to After Visit Summary for other counseling recommendations.  Return in about 4 weeks (around 06/19/2018) for Routine OB, BRX optimization.  Future Appointments  Date Time Provider Department Center  05/22/2018 10:10 AM [facility]    Judeth Horn, NP

## 2018-05-23 LAB — GLUCOSE TOLERANCE, 2 HOURS W/ 1HR
GLUCOSE, 1 HOUR: 138 mg/dL (ref 65–179)
GLUCOSE, FASTING: 82 mg/dL (ref 65–91)
Glucose, 2 hour: 109 mg/dL (ref 65–152)

## 2018-05-23 LAB — CBC
HEMATOCRIT: 27.4 % — AB (ref 34.0–46.6)
HEMOGLOBIN: 8.7 g/dL — AB (ref 11.1–15.9)
MCH: 25.9 pg — AB (ref 26.6–33.0)
MCHC: 31.8 g/dL (ref 31.5–35.7)
MCV: 82 fL (ref 79–97)
Platelets: 241 10*3/uL (ref 150–450)
RBC: 3.36 x10E6/uL — AB (ref 3.77–5.28)
RDW: 13.4 % (ref 12.3–15.4)
WBC: 7.4 10*3/uL (ref 3.4–10.8)

## 2018-05-23 LAB — HIV ANTIBODY (ROUTINE TESTING W REFLEX): HIV SCREEN 4TH GENERATION: NONREACTIVE

## 2018-05-23 LAB — RPR: RPR: NONREACTIVE

## 2018-05-26 ENCOUNTER — Encounter (HOSPITAL_COMMUNITY): Payer: Self-pay

## 2018-05-26 ENCOUNTER — Other Ambulatory Visit: Payer: Self-pay

## 2018-05-26 ENCOUNTER — Inpatient Hospital Stay (HOSPITAL_COMMUNITY)
Admission: AD | Admit: 2018-05-26 | Discharge: 2018-05-26 | Disposition: A | Discharge status: Home / Self Care | Payer: Medicaid Other | Source: Ambulatory Visit | Attending: Family Medicine | Admitting: Family Medicine

## 2018-05-26 DIAGNOSIS — Z79899 Other long term (current) drug therapy: Secondary | ICD-10-CM | POA: Insufficient documentation

## 2018-05-26 DIAGNOSIS — F419 Anxiety disorder, unspecified: Secondary | ICD-10-CM | POA: Insufficient documentation

## 2018-05-26 DIAGNOSIS — Z3A29 29 weeks gestation of pregnancy: Secondary | ICD-10-CM | POA: Diagnosis not present

## 2018-05-26 DIAGNOSIS — O99513 Diseases of the respiratory system complicating pregnancy, third trimester: Secondary | ICD-10-CM | POA: Diagnosis not present

## 2018-05-26 DIAGNOSIS — F329 Major depressive disorder, single episode, unspecified: Secondary | ICD-10-CM | POA: Diagnosis not present

## 2018-05-26 DIAGNOSIS — Z7722 Contact with and (suspected) exposure to environmental tobacco smoke (acute) (chronic): Secondary | ICD-10-CM | POA: Insufficient documentation

## 2018-05-26 DIAGNOSIS — R42 Dizziness and giddiness: Secondary | ICD-10-CM | POA: Diagnosis not present

## 2018-05-26 DIAGNOSIS — J45909 Unspecified asthma, uncomplicated: Secondary | ICD-10-CM | POA: Diagnosis not present

## 2018-05-26 DIAGNOSIS — O26893 Other specified pregnancy related conditions, third trimester: Secondary | ICD-10-CM | POA: Diagnosis not present

## 2018-05-26 DIAGNOSIS — O99343 Other mental disorders complicating pregnancy, third trimester: Secondary | ICD-10-CM | POA: Insufficient documentation

## 2018-05-26 DIAGNOSIS — R55 Syncope and collapse: Secondary | ICD-10-CM | POA: Insufficient documentation

## 2018-05-26 DIAGNOSIS — R109 Unspecified abdominal pain: Secondary | ICD-10-CM

## 2018-05-26 HISTORY — DX: Anxiety disorder, unspecified: F41.9

## 2018-05-26 LAB — CBC WITH DIFFERENTIAL/PLATELET
BASOS ABS: 0 10*3/uL (ref 0.0–0.1)
Basophils Relative: 0 %
Eosinophils Absolute: 0.2 10*3/uL (ref 0.0–0.7)
Eosinophils Relative: 2 %
HEMATOCRIT: 29.3 % — AB (ref 36.0–46.0)
Hemoglobin: 9.4 g/dL — ABNORMAL LOW (ref 12.0–15.0)
Lymphocytes Relative: 20 %
Lymphs Abs: 2.1 10*3/uL (ref 0.7–4.0)
MCH: 26.6 pg (ref 26.0–34.0)
MCHC: 32.1 g/dL (ref 30.0–36.0)
MCV: 82.8 fL (ref 78.0–100.0)
MONO ABS: 1.2 10*3/uL (ref 0.1–1.0)
MONOS PCT: 12 %
NEUTROS ABS: 7 10*3/uL (ref 1.7–7.7)
Neutrophils Relative %: 66 %
Platelets: 250 10*3/uL (ref 150–400)
RBC: 3.54 MIL/uL — ABNORMAL LOW (ref 3.87–5.11)
RDW: 14 % (ref 11.5–15.5)
WBC: 10.5 10*3/uL (ref 4.0–10.5)

## 2018-05-26 LAB — COMPREHENSIVE METABOLIC PANEL
ALT: 18 U/L (ref 0–44)
AST: 19 U/L (ref 15–41)
Albumin: 3.4 g/dL — ABNORMAL LOW (ref 3.5–5.0)
Alkaline Phosphatase: 99 U/L (ref 38–126)
Anion gap: 8 (ref 5–15)
BILIRUBIN TOTAL: 0.6 mg/dL (ref 0.3–1.2)
BUN: 5 mg/dL — AB (ref 6–20)
CO2: 22 mmol/L (ref 22–32)
Calcium: 9.1 mg/dL (ref 8.9–10.3)
Chloride: 105 mmol/L (ref 98–111)
Creatinine, Ser: 0.56 mg/dL (ref 0.44–1.00)
GFR calc Af Amer: >60 mL/min (ref >60)
Glucose, Bld: 80 mg/dL (ref 70–99)
POTASSIUM: 4 mmol/L (ref 3.5–5.1)
Sodium: 135 mmol/L (ref 135–145)
TOTAL PROTEIN: 6.8 g/dL (ref 6.5–8.1)

## 2018-05-26 LAB — URINALYSIS, ROUTINE W REFLEX MICROSCOPIC
BILIRUBIN URINE: NEGATIVE
Glucose, UA: NEGATIVE mg/dL
HGB URINE DIPSTICK: NEGATIVE
KETONES UR: NEGATIVE mg/dL
Leukocytes, UA: NEGATIVE
Nitrite: NEGATIVE
PROTEIN: NEGATIVE mg/dL
Specific Gravity, Urine: 1.004 — ABNORMAL LOW (ref 1.005–1.030)
pH: 8 (ref 5.0–8.0)

## 2018-05-26 LAB — RAPID URINE DRUG SCREEN, HOSP PERFORMED
AMPHETAMINES: NOT DETECTED
Barbiturates: NOT DETECTED
Benzodiazepines: NOT DETECTED
Cocaine: NOT DETECTED
OPIATES: NOT DETECTED
Tetrahydrocannabinol: POSITIVE — AB

## 2018-05-26 LAB — WET PREP, GENITAL
Clue Cells Wet Prep HPF POC: NONE SEEN
SPERM: NONE SEEN
Trich, Wet Prep: NONE SEEN
Yeast Wet Prep HPF POC: NONE SEEN

## 2018-05-26 LAB — GLUCOSE, CAPILLARY: GLUCOSE-CAPILLARY: 72 mg/dL (ref 70–99)

## 2018-05-26 NOTE — Discharge Instructions (Signed)

## 2018-05-26 NOTE — MAU Note (Signed)
Pt. Arrived via EMS. Pt. States she passed out at grocery story around 4pm today but her mother caught her.  Pt. Reports some lower abd. Pain.  Pt. Denies vaginal bleeding or LOF and dc.  Reports good fetal movement.

## 2018-05-26 NOTE — MAU Provider Note (Signed)
History     CSN: [id number]  Arrival date and time: 05/26/18 1617   First Provider Initiated Contact with Patient 05/26/18 1644      Chief Complaint  Patient presents with  . Loss of Consciousness  . Abdominal Pain   Beth Bray is a 22 y.o. G1P0000 at 428w0d who presents today after a syncopal episode. This occurred around 1600 today. She was walking into the grocery store, and felt dizzy. She then lost consciousness for apprx 5 mins. Her mother caught her, so she did not fall, hit her head or hit her abdomen. She denies any VB or LOF. She reports normal fetal movement. She last drank just after the event. She reports that she has not eaten anything today. She last ate around 2200 on 05/25/18.   Patient has had dizziness that has been ongoing for over 2 months. She has messaged with the RNs about this complaint the the epic messaging service. She has not mentioned it to the providers at her visits.   Loss of Consciousness  This is a new problem. The current episode started today (around 1600 ). She lost consciousness for a period of 1 to 5 minutes. The symptoms are aggravated by normal activity. Associated symptoms include abdominal pain. Pertinent negatives include no fever, nausea or vomiting. She has tried nothing for the symptoms.  Abdominal Pain  Pertinent negatives include no dysuria, fever, frequency, nausea or vomiting.    OB History    Gravida  1   Para  0   Term  0   Preterm  0   AB  0   Living  0     SAB  0   TAB  0   Ectopic  0   Multiple  0   Live Births              Past Medical History:  Diagnosis Date  . Anxiety   . Asthma   . Depression     Past Surgical History:  Procedure Laterality Date  . NO PAST SURGERIES    . WISDOM TOOTH EXTRACTION      Family History  Problem Relation Age of Onset  . Hypertension Mother   . Heart disease Mother   . Thyroid disease Mother     Social History   Tobacco Use  . Smoking status: Passive  Smoke Exposure - Never Smoker  . Smokeless tobacco: Never Used  Substance Use Topics  . Alcohol use: No  . Drug use: No    Allergies:  Allergies  Allergen Reactions  . Kiwi Extract Swelling  . Pineapple Other (See Comments)    Bumps and sores in mouth    Medications Prior to Admission  Medication Sig Dispense Refill Last Dose  . acetaminophen (TYLENOL) 325 MG tablet Take 325 mg by mouth every 6 (six) hours as needed.   Past Week at Unknown time  . metoCLOPramide (REGLAN) 10 MG tablet Take 1 tablet (10 mg total) by mouth every 8 (eight) hours as needed for nausea. 30 tablet 0 05/25/2018 at Unknown time  . Prenatal Vit-Fe Fumarate-FA (MULTIVITAMIN-PRENATAL) 27-0.8 MG TABS tablet Take 1 tablet by mouth daily at 12 noon.   05/26/2018 at Unknown time  . ranitidine (ZANTAC) 150 MG tablet Take 1 tablet (150 mg total) by mouth 2 (two) times daily. 60 tablet 0 05/25/2018 at Unknown time  . albuterol (PROVENTIL HFA;VENTOLIN HFA) 108 (90 BASE) MCG/ACT inhaler Inhale 3 puffs into the lungs every 4 (four) hours as  needed for wheezing. 1 Inhaler 0 Unknown at Unknown time  . Elastic Bandages & Supports (COMFORT FIT MATERNITY SUPP SM) MISC Maternity support belt - fit to client 1 each 0 Unknown at Unknown time    Review of Systems  Constitutional: Negative for chills and fever.  Cardiovascular: Positive for syncope.  Gastrointestinal: Positive for abdominal pain. Negative for nausea and vomiting.  Genitourinary: Negative for decreased urine volume, dysuria, frequency, pelvic pain and vaginal bleeding.   Physical Exam   Blood pressure 116/66, pulse 98, temperature 98.5 F (36.9 C), temperature source Oral, resp. rate 18, height 5\' 3"  (1.6 m), last menstrual period 11/04/2017, SpO2 99 %.  Physical Exam  Nursing note and vitals reviewed. Constitutional: She is oriented to person, place, and time. She appears well-developed and well-nourished. No distress.  HENT:  Head: Normocephalic.   Cardiovascular: Normal rate.  Respiratory: Effort normal.  GI: Soft. There is no tenderness. There is no rebound.  Genitourinary:  Genitourinary Comments: Cervix: closed/thick/posterior   Neurological: She is alert and oriented to person, place, and time.  Skin: Skin is warm and dry.  Psychiatric: She has a normal mood and affect.    NST:  Baseline: 135 Variability: moderate Accels: 15x15 Decels: none Toco: very occasional contraction  EKG: NSR  Results for orders placed or performed during the hospital encounter of 05/26/18 (from the past 24 hour(s))  Urinalysis, Routine w reflex microscopic     Status: Abnormal   Collection Time: 05/26/18  4:58 PM  Result Value Ref Range   Color, Urine STRAW (A) YELLOW   APPearance CLEAR CLEAR   Specific Gravity, Urine 1.004 (L) 1.005 - 1.030   pH 8.0 5.0 - 8.0   Glucose, UA NEGATIVE NEGATIVE mg/dL   Hgb urine dipstick NEGATIVE NEGATIVE   Bilirubin Urine NEGATIVE NEGATIVE   Ketones, ur NEGATIVE NEGATIVE mg/dL   Protein, ur NEGATIVE NEGATIVE mg/dL   Nitrite NEGATIVE NEGATIVE   Leukocytes, UA NEGATIVE NEGATIVE  Urine rapid drug screen (hosp performed)     Status: Abnormal   Collection Time: 05/26/18  4:58 PM  Result Value Ref Range   Opiates NONE DETECTED NONE DETECTED   Cocaine NONE DETECTED NONE DETECTED   Benzodiazepines NONE DETECTED NONE DETECTED   Amphetamines NONE DETECTED NONE DETECTED   Tetrahydrocannabinol POSITIVE (A) NONE DETECTED   Barbiturates NONE DETECTED NONE DETECTED  CBC with Differential/Platelet     Status: Abnormal   Collection Time: 05/26/18  5:30 PM  Result Value Ref Range   WBC 10.5 4.0 - 10.5 K/uL   RBC 3.54 (L) 3.87 - 5.11 MIL/uL   Hemoglobin 9.4 (L) 12.0 - 15.0 g/dL   HCT 16.1 (L) 09.6 - 04.5 %   MCV 82.8 78.0 - 100.0 fL   MCH 26.6 26.0 - 34.0 pg   MCHC 32.1 30.0 - 36.0 g/dL   RDW 40.9 81.1 - 91.4 %   Platelets 250 150 - 400 K/uL   Neutrophils Relative % 66 %   Neutro Abs 7.0 1.7 - 7.7 K/uL    Lymphocytes Relative 20 %   Lymphs Abs 2.1 0.7 - 4.0 K/uL   Monocytes Relative 12 %   Monocytes Absolute 1.2 0.1 - 1.0 K/uL   Eosinophils Relative 2 %   Eosinophils Absolute 0.2 0.0 - 0.7 K/uL   Basophils Relative 0 %   Basophils Absolute 0.0 0.0 - 0.1 K/uL  Comprehensive metabolic panel     Status: Abnormal   Collection Time: 05/26/18  5:30 PM  Result Value  Ref Range   Sodium 135 135 - 145 mmol/L   Potassium 4.0 3.5 - 5.1 mmol/L   Chloride 105 98 - 111 mmol/L   CO2 22 22 - 32 mmol/L   Glucose, Bld 80 70 - 99 mg/dL   BUN 5 (L) 6 - 20 mg/dL   Creatinine, Ser 5.73 0.44 - 1.00 mg/dL   Calcium 9.1 8.9 - 22.0 mg/dL   Total Protein 6.8 6.5 - 8.1 g/dL   Albumin 3.4 (L) 3.5 - 5.0 g/dL   AST 19 15 - 41 U/L   ALT 18 0 - 44 U/L   Alkaline Phosphatase 99 38 - 126 U/L   Total Bilirubin 0.6 0.3 - 1.2 mg/dL   GFR calc non Af Amer >60 >60 mL/min   GFR calc Af Amer >60 >60 mL/min   Anion gap 8 5 - 15  Wet prep, genital     Status: Abnormal   Collection Time: 05/26/18  6:09 PM  Result Value Ref Range   Yeast Wet Prep HPF POC NONE SEEN NONE SEEN   Trich, Wet Prep NONE SEEN NONE SEEN   Clue Cells Wet Prep HPF POC NONE SEEN NONE SEEN   WBC, Wet Prep HPF POC FEW (A) NONE SEEN   Sperm NONE SEEN   Glucose, capillary     Status: None   Collection Time: 05/26/18  6:19 PM  Result Value Ref Range   Glucose-Capillary 72 70 - 99 mg/dL    MAU Course  Procedures  MDM Orthostatic VS WNL Patient felt hot and dizzy again while being here. She has not eaten all day. Will get CBG and have patient order dinner tray.   Assessment and Plan   1. Syncope, unspecified syncope type   2. Dizziness   3. [redacted] weeks gestation of pregnancy    DC home Comfort measures reviewed  3rd Trimester precautions  PTL precautions  Fetal kick counts RX: none  Return to MAU as needed FU with OB as planned  Follow-up Information    Center for [facility] Follow up.   Specialty:  Obstetrics and  Gynecology Contact information: [address] [place] [address] [phone]           Thressa Sheller 05/26/2018, 6:36 PM

## 2018-05-27 ENCOUNTER — Telehealth: Payer: Self-pay

## 2018-05-27 LAB — GC/CHLAMYDIA PROBE AMP (~~LOC~~) NOT AT ARMC
Chlamydia: NEGATIVE
Neisseria Gonorrhea: NEGATIVE

## 2018-05-27 NOTE — Telephone Encounter (Addendum)
LM for pt to return call in regards to her concerns about her appointment.  Sent message via MyChart.

## 2018-05-30 NOTE — BH Specialist Note (Signed)
error 

## 2018-06-13 ENCOUNTER — Encounter: Payer: Self-pay | Admitting: General Practice

## 2018-06-20 ENCOUNTER — Ambulatory Visit (INDEPENDENT_AMBULATORY_CARE_PROVIDER_SITE_OTHER): Payer: Medicaid Other | Admitting: Obstetrics and Gynecology

## 2018-06-20 DIAGNOSIS — Z3402 Encounter for supervision of normal first pregnancy, second trimester: Secondary | ICD-10-CM

## 2018-06-20 DIAGNOSIS — Z3403 Encounter for supervision of normal first pregnancy, third trimester: Secondary | ICD-10-CM

## 2018-06-20 NOTE — Progress Notes (Signed)
   PRENATAL VISIT NOTE  Subjective:  Beth Bray is a 22 y.o. G1P0000 at [redacted]w[redacted]d being seen today for ongoing prenatal care.  She is currently monitored for the following issues for this low-risk pregnancy and has Supervision of low-risk first pregnancy and Constipation in female on their problem list.  Patient reports fatigue and insomnia.  Contractions: Not present. Vag. Bleeding: None.  Movement: Present. Denies leaking of fluid.   The following portions of the patient's history were reviewed and updated as appropriate: allergies, current medications, past family history, past medical history, past social history, past surgical history and problem list. Problem list updated.  Objective:   Vitals:   06/20/18 1011  BP: 117/62  Pulse: 93  Weight: 134 lb (60.8 kg)    Fetal Status: Fetal Heart Rate (bpm): 137 Fundal Height: 34 cm Movement: Present     General:  Alert, oriented and cooperative. Patient is in no acute distress.  Skin: Skin is warm and dry. No rash noted.   Cardiovascular: Normal heart rate noted  Respiratory: Normal respiratory effort, no problems with respiration noted  Abdomen: Soft, gravid, appropriate for gestational age.  Pain/Pressure: Present     Pelvic: Cervical exam deferred        Extremities: Normal range of motion.  Edema: None  Mental Status: Normal mood and affect. Normal behavior. Normal judgment and thought content.   Assessment and Plan:  Pregnancy: G1P0000 at [redacted]w[redacted]d  1. Encounter for supervision of low-risk first pregnancy in second trimester  - Doing well. Discussed good sleep habits. Safe medication list provided.   Preterm labor symptoms and general obstetric precautions including but not limited to vaginal bleeding, contractions, leaking of fluid and fetal movement were reviewed in detail with the patient. Please refer to After Visit Summary for other counseling recommendations.  Return in about 2 weeks (around 07/04/2018).  No future  appointments.  Venia Carbon, NP

## 2018-06-20 NOTE — Patient Instructions (Signed)

## 2018-06-23 ENCOUNTER — Inpatient Hospital Stay (HOSPITAL_COMMUNITY)
Admission: AD | Admit: 2018-06-23 | Discharge: 2018-06-23 | Disposition: A | Discharge status: Home / Self Care | Payer: Medicaid Other | Source: Ambulatory Visit | Attending: Obstetrics and Gynecology | Admitting: Obstetrics and Gynecology

## 2018-06-23 ENCOUNTER — Encounter (HOSPITAL_COMMUNITY): Payer: Self-pay | Admitting: *Deleted

## 2018-06-23 DIAGNOSIS — O26893 Other specified pregnancy related conditions, third trimester: Secondary | ICD-10-CM | POA: Diagnosis not present

## 2018-06-23 DIAGNOSIS — Z7722 Contact with and (suspected) exposure to environmental tobacco smoke (acute) (chronic): Secondary | ICD-10-CM | POA: Insufficient documentation

## 2018-06-23 DIAGNOSIS — O9989 Other specified diseases and conditions complicating pregnancy, childbirth and the puerperium: Secondary | ICD-10-CM

## 2018-06-23 DIAGNOSIS — R102 Pelvic and perineal pain: Secondary | ICD-10-CM | POA: Diagnosis not present

## 2018-06-23 DIAGNOSIS — M549 Dorsalgia, unspecified: Secondary | ICD-10-CM

## 2018-06-23 DIAGNOSIS — M545 Low back pain: Secondary | ICD-10-CM | POA: Diagnosis present

## 2018-06-23 DIAGNOSIS — O99891 Other specified diseases and conditions complicating pregnancy: Secondary | ICD-10-CM

## 2018-06-23 DIAGNOSIS — N949 Unspecified condition associated with female genital organs and menstrual cycle: Secondary | ICD-10-CM

## 2018-06-23 DIAGNOSIS — Z3A33 33 weeks gestation of pregnancy: Secondary | ICD-10-CM | POA: Diagnosis not present

## 2018-06-23 LAB — URINALYSIS, ROUTINE W REFLEX MICROSCOPIC
Bilirubin Urine: NEGATIVE
GLUCOSE, UA: NEGATIVE mg/dL
KETONES UR: NEGATIVE mg/dL
Leukocytes, UA: NEGATIVE
NITRITE: NEGATIVE
PROTEIN: NEGATIVE mg/dL
Specific Gravity, Urine: 1.005 (ref 1.005–1.030)
pH: 9 — ABNORMAL HIGH (ref 5.0–8.0)

## 2018-06-23 LAB — WET PREP, GENITAL
Clue Cells Wet Prep HPF POC: NONE SEEN
Sperm: NONE SEEN
Trich, Wet Prep: NONE SEEN
Yeast Wet Prep HPF POC: NONE SEEN

## 2018-06-23 MED ORDER — CYCLOBENZAPRINE HCL 5 MG PO TABS: 5.0000 mg | ORAL_TABLET | Freq: Three times a day (TID) | ORAL | 0 refills | Status: DC | PRN

## 2018-06-23 NOTE — MAU Provider Note (Signed)
Chief Complaint:  Generalized Body Aches    First Provider Initiated Contact with Patient 06/23/18 1322      HPI: Beth Bray is a 22 y.o. G1P0000 at [redacted]w[redacted]d who presents to maternity admissions reporting low back and low stomach pain for several days.  Unsure if she is having contractions.  Pain is interfering with sleep.  No improvement with Tylenol.  Location: Low back, low abdomen Quality: Ache Severity: 10/10 in pain scale Duration: 2-3 days Context: [redacted] weeks gestation.  Recently did a lot of house cleaning and preparations for the baby. Timing: Intermittent Modifying factors: No improvement with Tylenol. Associated signs and symptoms: Negative for fever, chills, vaginal bleeding, leaking of fluid, vaginal discharge, urinary complaints, GI complaints. Good fetal movement.   Pregnancy Course: Uncomplicated.  [facility] care at [facility].  Past Medical History:  Diagnosis Date  . Anxiety   . Asthma   . Depression    OB History  Gravida Para Term Preterm AB Living  1 0 0 0 0 0  SAB TAB Ectopic Multiple Live Births  0 0 0 0      # Outcome Date GA Lbr Len/2nd Weight Sex Delivery Anes PTL Lv  1 Current            Past Surgical History:  Procedure Laterality Date  . NO PAST SURGERIES    . WISDOM TOOTH EXTRACTION     Family History  Problem Relation Age of Onset  . Hypertension Mother   . Heart disease Mother   . Thyroid disease Mother    Social History   Tobacco Use  . Smoking status: Passive Smoke Exposure - Never Smoker  . Smokeless tobacco: Never Used  Substance Use Topics  . Alcohol use: No  . Drug use: No   Allergies  Allergen Reactions  . Kiwi Extract Swelling  . Pineapple Other (See Comments)    Bumps and sores in mouth   No medications prior to admission.    I have reviewed patient's Past Medical Hx, Surgical Hx, Family Hx, Social Hx, medications and allergies.   ROS:  Review of Systems  Constitutional:  Positive for fever. Negative for chills.  Gastrointestinal: Positive for abdominal pain. Negative for constipation, diarrhea, nausea and vomiting.  Genitourinary: Positive for frequency (Throughout entire pregnancy.  No change recently.). Negative for difficulty urinating, dysuria, flank pain, hematuria, vaginal bleeding and vaginal discharge.  Musculoskeletal: Positive for back pain and myalgias. Negative for neck pain.  Neurological: Negative for headaches.    Physical Exam   Patient Vitals for the past 24 hrs:  BP Temp Temp src Pulse Resp SpO2 Weight  06/23/18 1300 (!) 99/55 - Oral (!) 105 18 - -  06/23/18 1256 113/62 98.4 F (36.9 C) Oral (!) 103 18 100 % -  06/23/18 1246 - - - - - - 66.7 kg   Constitutional: Well-developed, well-nourished female in no acute distress.  Cardiovascular: normal rate Respiratory: normal effort GI: Abd soft, non-tender, gravid appropriate for gestational age.  MS: Extremities nontender, no edema, normal ROM Neurologic: Alert and oriented x 4.  GU: Neg CVAT.  Pelvic: NEFG, physiologic discharge, no blood. No CMT   Cervix closed/thick/high  FHT:  Baseline 145 , moderate variability, accelerations present, no decelerations Contractions: None   Labs: Results for orders placed or performed during the hospital encounter of 06/23/18 (from the past 24 hour(s))  Urinalysis, Routine w reflex microscopic     Status: Abnormal   Collection Time:  06/23/18 12:58 PM  Result Value Ref Range   Color, Urine STRAW (A) YELLOW   APPearance CLEAR CLEAR   Specific Gravity, Urine 1.005 1.005 - 1.030   pH 9.0 (H) 5.0 - 8.0   Glucose, UA NEGATIVE NEGATIVE mg/dL   Hgb urine dipstick SMALL (A) NEGATIVE   Bilirubin Urine NEGATIVE NEGATIVE   Ketones, ur NEGATIVE NEGATIVE mg/dL   Protein, ur NEGATIVE NEGATIVE mg/dL   Nitrite NEGATIVE NEGATIVE   Leukocytes, UA NEGATIVE NEGATIVE   RBC / HPF 11-20 0 - 5 RBC/hpf   WBC, UA 0-5 0 - 5 WBC/hpf   Bacteria, UA RARE (A) NONE SEEN    Squamous Epithelial / LPF 6-10 0 - 5   Mucus PRESENT   Wet prep, genital     Status: Abnormal   Collection Time: 06/23/18  1:44 PM  Result Value Ref Range   Yeast Wet Prep HPF POC NONE SEEN NONE SEEN   Trich, Wet Prep NONE SEEN NONE SEEN   Clue Cells Wet Prep HPF POC NONE SEEN NONE SEEN   WBC, Wet Prep HPF POC MODERATE (A) NONE SEEN   Sperm NONE SEEN     Imaging:  No results found.  MAU Course: Orders Placed This Encounter  Procedures  . Wet prep, genital  . Culture, OB Urine  . Urinalysis, Routine w reflex microscopic  . Discharge patient   Meds ordered this encounter  Medications  . cyclobenzaprine (FLEXERIL) 5 MG tablet    Sig: Take 1-2 tablets (5-10 mg total) by mouth every 8 (eight) hours as needed for muscle spasms.    Dispense:  30 tablet    Refill:  0    Order Specific Question:   Supervising Provider    Answer:   Conan Bowens [[id number]]    MDM: -Low back and low abdominal musculoskeletal and round ligament pain.  No evidence of active preterm labor or infection.  Pain likely caused by increase in physical activity over the past several days.  Rx Flexeril to use sparingly since pain is very troublesome to patient and disrupting her sleep.  Recommend warm baths, stretches.  Assessment: 1. Back pain affecting pregnancy in third trimester   2. Round ligament pain     Plan: Discharge home in stable condition.  Preterm labor precautions and fetal kick counts Rx Flexeril Comfort measures GC/chlamydia and urine cultures pending. Follow-up Information    [facility] up on 07/08/2018.   Specialty:  Obstetrics and Gynecology Why:  as scheduled or sooner as needed if symptoms worsen Contact information: [address] [place] [address] [phone]       [facility] Follow up.   Why:  as needed in pregnancy emergencies Contact information: [address] [id number] mc [place] [address] [phone]          Allergies as of 06/23/2018      Reactions   Kiwi Extract Swelling   Pineapple Other (See Comments)   Bumps and sores in mouth      Medication List    TAKE these medications   acetaminophen 325 MG tablet Commonly known as:  TYLENOL Take 325 mg by mouth every 6 (six) hours as needed.   albuterol 108 (90 Base) MCG/ACT inhaler Commonly known as:  PROVENTIL HFA;VENTOLIN HFA Inhale 3 puffs into the lungs every 4 (four) hours as needed for wheezing.   COMFORT FIT MATERNITY SUPP SM Misc Maternity support belt - fit to client  cyclobenzaprine 5 MG tablet Commonly known as:  FLEXERIL Take 1-2 tablets (5-10 mg total) by mouth every 8 (eight) hours as needed for muscle spasms.   metoCLOPramide 10 MG tablet Commonly known as:  REGLAN Take 1 tablet (10 mg total) by mouth every 8 (eight) hours as needed for nausea.   multivitamin-prenatal 27-0.8 MG Tabs tablet Take 1 tablet by mouth daily at 12 noon.   ranitidine 150 MG tablet Commonly known as:  ZANTAC Take 1 tablet (150 mg total) by mouth 2 (two) times daily.       Katrinka Blazing, [place], CNM 06/23/2018 3:19 PM

## 2018-06-23 NOTE — MAU Note (Addendum)
Beth Bray is a 22 y.o. at [redacted]w[redacted]d here in MAU reporting: +generalized aches. Worse in back and abdomen. Constant and ongoing. States unable to sleep at night. States tylenol is not working. Pain score: 10/10 Vitals:   06/23/18 1256  BP: 113/62  Pulse: (!) 103  Resp: 18  Temp: 98.4 F (36.9 C)  SpO2: 100%     FHT: 140 via external monitor Lab orders placed from triage: ua

## 2018-06-23 NOTE — Discharge Instructions (Signed)
We will call you or send a MyChart message if any of you others tests come back positive.   Braxton Hicks Contractions Contractions of the uterus can occur throughout pregnancy, but they are not always a sign that you are in labor. You may have practice contractions called Braxton Hicks contractions. These false labor contractions are sometimes confused with true labor. What are Beth Bray contractions? Braxton Hicks contractions are tightening movements that occur in the muscles of the uterus before labor. Unlike true labor contractions, these contractions do not result in opening (dilation) and thinning of the cervix. Toward the end of pregnancy (32-34 weeks), Braxton Hicks contractions can happen more often and may become stronger. These contractions are sometimes difficult to tell apart from true labor because they can be very uncomfortable. You should not feel embarrassed if you go to the hospital with false labor. Sometimes, the only way to tell if you are in true labor is for your health care provider to look for changes in the cervix. The health care provider will do a physical exam and may monitor your contractions. If you are not in true labor, the exam should show that your cervix is not dilating and your water has not broken. If there are other health problems associated with your pregnancy, it is completely safe for you to be sent home with false labor. You may continue to have Braxton Hicks contractions until you go into true labor. How to tell the difference between true labor and false labor True labor  Contractions last 30-70 seconds.  Contractions become very regular.  Discomfort is usually felt in the top of the uterus, and it spreads to the lower abdomen and low back.  Contractions do not go away with walking.  Contractions usually become more intense and increase in frequency.  The cervix dilates and gets thinner. False labor  Contractions are usually shorter and not  as strong as true labor contractions.  Contractions are usually irregular.  Contractions are often felt in the front of the lower abdomen and in the groin.  Contractions may go away when you walk around or change positions while lying down.  Contractions get weaker and are shorter-lasting as time goes on.  The cervix usually does not dilate or become thin. Follow these instructions at home:  Take over-the-counter and prescription medicines only as told by your health care provider.  Keep up with your usual exercises and follow other instructions from your health care provider.  Eat and drink lightly if you think you are going into labor.  If Braxton Hicks contractions are making you uncomfortable: ? Change your position from lying down or resting to walking, or change from walking to resting. ? Sit and rest in a tub of warm water. ? Drink enough fluid to keep your urine pale yellow. Dehydration may cause these contractions. ? Do slow and deep breathing several times an hour.  Keep all follow-up prenatal visits as told by your health care provider. This is important. Contact a health care provider if:  You have a fever.  You have continuous pain in your abdomen. Get help right away if:  Your contractions become stronger, more regular, and closer together.  You have fluid leaking or gushing from your vagina.  You pass blood-tinged mucus (bloody show).  You have bleeding from your vagina.  You have low back pain that you never had before.  You feel your babys head pushing down and causing pelvic pressure.  Your baby is  not moving inside you as much as it used to. Summary  Contractions that occur before labor are called Braxton Hicks contractions, false labor, or practice contractions.  Braxton Hicks contractions are usually shorter, weaker, farther apart, and less regular than true labor contractions. True labor contractions usually become progressively stronger and  regular and they become more frequent.  Manage discomfort from [facility] contractions by changing position, resting in a warm bath, drinking plenty of water, or practicing deep breathing. This information is not intended to replace advice given to you by your health care provider. Make sure you discuss any questions you have with your health care provider. Document Released: 01/11/2017 Document Revised: 01/11/2017 Document Reviewed: 01/11/2017 Elsevier Interactive Patient Education  2018 [employer]   Back Pain in Pregnancy Back pain during pregnancy is common. Back pain may be caused by several factors that are related to changes during your pregnancy. Follow these instructions at home: Managing pain, stiffness, and swelling  If directed, apply ice for sudden (acute) back pain. ? Put ice in a plastic bag. ? Place a towel between your skin and the bag. ? Leave the ice on for 20 minutes, 2-3 times per day.  If directed, apply heat to the affected area before you exercise: ? Place a towel between your skin and the heat pack or heating pad. ? Leave the heat on for 20-30 minutes. ? Remove the heat if your skin turns bright red. This is especially important if you are unable to feel pain, heat, or cold. You may have a greater risk of getting burned. Activity  Exercise as told by your health care provider. Exercising is the best way to prevent or manage back pain.  Listen to your body when lifting. If lifting hurts, ask for help or bend your knees. This uses your leg muscles instead of your back muscles.  Squat down when picking up something from the floor. Do not bend over.  Only use bed rest as told by your health care provider. Bed rest should only be used for the most severe episodes of back pain. Standing, Sitting, and Lying Down  Do not stand in one place for long periods of time.  Use good posture when sitting. Make sure your head rests over your shoulders and is not  hanging forward. Use a pillow on your lower back if necessary.  Try sleeping on your side, preferably the left side, with a pillow or two between your legs. If you are sore after a night's rest, your bed may be too soft. A firm mattress may provide more support for your back during pregnancy. General instructions  Do not wear high heels.  Eat a healthy diet. Try to gain weight within your health care provider's recommendations.  Use a maternity girdle, elastic sling, or back brace as told by your health care provider.  Take over-the-counter and prescription medicines only as told by your health care provider.  Keep all follow-up visits as told by your health care provider. This is important. This includes any visits with any specialists, such as a physical therapist. Contact a health care provider if:  Your back pain interferes with your daily activities.  You have increasing pain in other parts of your body. Get help right away if:  You develop numbness, tingling, weakness, or problems with the use of your arms or legs.  You develop severe back pain that is not controlled with medicine.  You have a sudden change in bowel or bladder  control.  You develop shortness of breath, dizziness, or you faint.  You develop nausea, vomiting, or sweating.  You have back pain that is a rhythmic, cramping pain similar to labor pains. Labor pain is usually 1-2 minutes apart, lasts for about 1 minute, and involves a bearing down feeling or pressure in your pelvis.  You have back pain and your water breaks or you have vaginal bleeding.  You have back pain or numbness that travels down your leg.  Your back pain developed after you fell.  You develop pain on one side of your back.  You see blood in your urine.  You develop skin blisters in the area of your back pain. This information is not intended to replace advice given to you by your health care provider. Make sure you discuss any  questions you have with your health care provider. Document Released: 12/06/2005 Document Revised: 02/03/2016 Document Reviewed: 05/12/2015 Elsevier Interactive Patient Education  [employer].

## 2018-06-24 LAB — GC/CHLAMYDIA PROBE AMP (~~LOC~~) NOT AT ARMC
Chlamydia: NEGATIVE
Neisseria Gonorrhea: NEGATIVE

## 2018-06-24 LAB — CULTURE, OB URINE
Culture: NO GROWTH
Special Requests: NORMAL

## 2018-07-08 ENCOUNTER — Ambulatory Visit (INDEPENDENT_AMBULATORY_CARE_PROVIDER_SITE_OTHER): Payer: Medicaid Other | Admitting: Family Medicine

## 2018-07-08 VITALS — BP 106/58 | HR 102 | Wt 149.2 lb

## 2018-07-08 DIAGNOSIS — Z3403 Encounter for supervision of normal first pregnancy, third trimester: Secondary | ICD-10-CM

## 2018-07-08 NOTE — Progress Notes (Signed)
    PRENATAL VISIT NOTE  Subjective:  Beth Bray is a 22 y.o. G1P0000 at [redacted]w[redacted]d being seen today for ongoing prenatal care.  She is currently monitored for the following issues for this low-risk pregnancy and has Supervision of low-risk first pregnancy and Constipation in female on their problem list.  Patient reports Right sided back pain and pelvic pain that has been going on about a month. Taking flexeril, which is helpful. .  Contractions: Irritability. Vag. Bleeding: None.  Movement: Present. Denies leaking of fluid.   The following portions of the patient's history were reviewed and updated as appropriate: allergies, current medications, past family history, past medical history, past social history, past surgical history and problem list. Problem list updated.  Objective:   Vitals:   07/08/18 0939  BP: (!) 106/58  Pulse: (!) 102  Weight: 149 lb 3.2 oz (67.7 kg)    Fetal Status: Fetal Heart Rate (bpm): 144 Fundal Height: 36 cm Movement: Present     General:  Alert, oriented and cooperative. Patient is in no acute distress.  Skin: Skin is warm and dry. No rash noted.   Cardiovascular: Normal heart rate noted  Respiratory: Normal respiratory effort, no problems with respiration noted  Abdomen: Soft, gravid, appropriate for gestational age.  Pain/Pressure: Present     Pelvic: Cervical exam deferred        Extremities: Normal range of motion.  Edema: Trace  Mental Status: Normal mood and affect. Normal behavior. Normal judgment and thought content.   Assessment and Plan:  Pregnancy: G1P0000 at [redacted]w[redacted]d  1. Encounter for supervision of low-risk first pregnancy in third trimester Doing well  Discussed deferring cervical exam given preterm and no contractions, bleeding or other indication  Preterm labor symptoms and general obstetric precautions including but not limited to vaginal bleeding, contractions, leaking of fluid and fetal movement were reviewed in detail with the  patient. Please refer to After Visit Summary for other counseling recommendations.  Return in about 1 week (around 07/15/2018) for ROB.  No future appointments.  Gwenevere Abbot, MD

## 2018-07-08 NOTE — Patient Instructions (Signed)
Places to have your son circumcised:    [facility] [phone] while you are in hospital  [facility] [phone] $244 by 4 wks  [facility] [phone] $175 by 2 wks  [facility] [phone] $250 by 7 days [facility] [phone] $269 by 4 wks  These prices sometimes change but are roughly what you can expect to pay. Please call and confirm pricing.   Circumcision is considered an elective/non-medically necessary procedure. There are many reasons parents decide to have their sons circumsized. During the first year of life circumcised males have a reduced risk of urinary tract infections but after this year the rates between circumcised males and uncircumcised males are the same.  It is safe to have your son circumcised outside of the hospital and the places above perform them regularly.   Deciding about Circumcision in Baby Boys  (Up-to-date The Basics)  What is circumcision?  Circumcision is a surgery that removes the skin that covers the tip of the penis, called the "foreskin" Circumcision is usually done when a boy is between 60 and 30 days old. In the [place], circumcision is common. In some other countries, fewer boys are circumcised. Circumcision is a common tradition in some religions.  Should I have my baby boy circumcised?  There is no easy answer. Circumcision has some benefits. But it also has risks. After talking with your doctor, you will have to decide for yourself what is right for your family.  What are the benefits of circumcision?  Circumcised boys seem to have slightly lower rates of: ?Urinary tract infections ?Swelling of the opening at the tip of the penis Circumcised men seem to have slightly lower rates of: ?Urinary tract infections ?Swelling of the opening at the tip of the penis ?Penis  cancer ?HIV and other infections that you catch during sex ?Cervical cancer in the women they have sex with Even so, in the [place], the risks of these problems are small - even in boys and men who have not been circumcised. Plus, boys and men who are not circumcised can reduce these extra risks by: ?Cleaning their penis well ?Using condoms during sex  What are the risks of circumcision?  Risks include: ?Bleeding or infection from the surgery ?Damage to or amputation of the penis ?A chance that the doctor will cut off too much or not enough of the foreskin ?A chance that sex won't feel as good later in life Only about 1 out of every 200 circumcisions leads to problems. There is also a chance that your health insurance won't pay for circumcision.  How is circumcision done in baby boys?  First, the baby gets medicine for pain relief. This might be a cream on the skin or a shot into the base of the penis. Next, the doctor cleans the baby's penis well. Then he or she uses special tools to cut off the foreskin. Finally, the doctor wraps a bandage (called gauze) around the baby's penis. If you have your baby circumcised, his doctor or nurse will give you instructions on how to care for him after the surgery. It is important that you follow those instructions carefully.   Research childbirth classes and hospital preregistration at [email or web address]  Fetal Movement Counts Patient Name: ________________________________________________ Patient Due Date: ____________________ What is a fetal movement count? A fetal movement count is the number of times that you feel your baby move during a certain amount of time. This may also be called a fetal kick count. A fetal movement  count is recommended for every pregnant woman. You may be asked to start counting fetal movements as early as week 28 of your pregnancy. Pay attention to when your baby is most active. You may notice your baby's sleep and  wake cycles. You may also notice things that make your baby move more. You should do a fetal movement count:  When your baby is normally most active.  At the same time each day.  A good time to count movements is while you are resting, after having something to eat and drink. How do I count fetal movements? 1. Find a quiet, comfortable area. Sit, or lie down on your side. 2. Write down the date, the start time and stop time, and the number of movements that you felt between those two times. Take this information with you to your health care visits. 3. For 2 hours, count kicks, flutters, swishes, rolls, and jabs. You should feel at least 10 movements during 2 hours. 4. You may stop counting after you have felt 10 movements. 5. If you do not feel 10 movements in 2 hours, have something to eat and drink. Then, keep resting and counting for 1 hour. If you feel at least 4 movements during that hour, you may stop counting. Contact a health care provider if:  You feel fewer than 4 movements in 2 hours.  Your baby is not moving like he or she usually does. Date: ____________ Start time: ____________ Stop time: ____________ Movements: ____________ Date: ____________ Start time: ____________ Stop time: ____________ Movements: ____________ Date: ____________ Start time: ____________ Stop time: ____________ Movements: ____________ Date: ____________ Start time: ____________ Stop time: ____________ Movements: ____________ Date: ____________ Start time: ____________ Stop time: ____________ Movements: ____________ Date: ____________ Start time: ____________ Stop time: ____________ Movements: ____________ Date: ____________ Start time: ____________ Stop time: ____________ Movements: ____________ Date: ____________ Start time: ____________ Stop time: ____________ Movements: ____________ Date: ____________ Start time: ____________ Stop time: ____________ Movements: ____________ This information is not  intended to replace advice given to you by your health care provider. Make sure you discuss any questions you have with your health care provider. Document Released: 09/27/2006 Document Revised: 04/26/2016 Document Reviewed: 10/07/2015 Elsevier Interactive Patient Education  2018 [employer].  [employer] of the uterus can occur throughout pregnancy, but they are not always a sign that you are in labor. You may have practice contractions called Braxton Hicks contractions. These false labor contractions are sometimes confused with true labor. What are Deberah Pelton contractions? Braxton Hicks contractions are tightening movements that occur in the muscles of the uterus before labor. Unlike true labor contractions, these contractions do not result in opening (dilation) and thinning of the cervix. Toward the end of pregnancy (32-34 weeks), Braxton Hicks contractions can happen more often and may become stronger. These contractions are sometimes difficult to tell apart from true labor because they can be very uncomfortable. You should not feel embarrassed if you go to the hospital with false labor. Sometimes, the only way to tell if you are in true labor is for your health care provider to look for changes in the cervix. The health care provider will do a physical exam and may monitor your contractions. If you are not in true labor, the exam should show that your cervix is not dilating and your water has not broken. If there are other health problems associated with your pregnancy, it is completely safe for you to be sent home with false labor. You may  continue to have Braxton Hicks contractions until you go into true labor. How to tell the difference between true labor and false labor True labor  Contractions last 30-70 seconds.  Contractions become very regular.  Discomfort is usually felt in the top of the uterus, and it spreads to the lower abdomen and low  back.  Contractions do not go away with walking.  Contractions usually become more intense and increase in frequency.  The cervix dilates and gets thinner. False labor  Contractions are usually shorter and not as strong as true labor contractions.  Contractions are usually irregular.  Contractions are often felt in the front of the lower abdomen and in the groin.  Contractions may go away when you walk around or change positions while lying down.  Contractions get weaker and are shorter-lasting as time goes on.  The cervix usually does not dilate or become thin. Follow these instructions at home:  Take over-the-counter and prescription medicines only as told by your health care provider.  Keep up with your usual exercises and follow other instructions from your health care provider.  Eat and drink lightly if you think you are going into labor.  If Braxton Hicks contractions are making you uncomfortable: ? Change your position from lying down or resting to walking, or change from walking to resting. ? Sit and rest in a tub of warm water. ? Drink enough fluid to keep your urine pale yellow. Dehydration may cause these contractions. ? Do slow and deep breathing several times an hour.  Keep all follow-up prenatal visits as told by your health care provider. This is important. Contact a health care provider if:  You have a fever.  You have continuous pain in your abdomen. Get help right away if:  Your contractions become stronger, more regular, and closer together.  You have fluid leaking or gushing from your vagina.  You pass blood-tinged mucus (bloody show).  You have bleeding from your vagina.  You have low back pain that you never had before.  You feel your baby's head pushing down and causing pelvic pressure.  Your baby is not moving inside you as much as it used to. Summary  Contractions that occur before labor are called Braxton Hicks contractions, false  labor, or practice contractions.  Braxton Hicks contractions are usually shorter, weaker, farther apart, and less regular than true labor contractions. True labor contractions usually become progressively stronger and regular and they become more frequent.  Manage discomfort from [facility] contractions by changing position, resting in a warm bath, drinking plenty of water, or practicing deep breathing. This information is not intended to replace advice given to you by your health care provider. Make sure you discuss any questions you have with your health care provider. Document Released: 01/11/2017 Document Revised: 01/11/2017 Document Reviewed: 01/11/2017 Elsevier Interactive Patient Education  2018 [employer].

## 2018-07-15 ENCOUNTER — Ambulatory Visit (INDEPENDENT_AMBULATORY_CARE_PROVIDER_SITE_OTHER): Payer: Medicaid Other | Admitting: Advanced Practice Midwife

## 2018-07-15 ENCOUNTER — Encounter: Payer: Self-pay | Admitting: Advanced Practice Midwife

## 2018-07-15 ENCOUNTER — Other Ambulatory Visit (HOSPITAL_COMMUNITY)
Admission: RE | Admit: 2018-07-15 | Discharge: 2018-07-15 | Disposition: A | Discharge status: Home / Self Care | Payer: Medicaid Other | Source: Ambulatory Visit | Attending: Advanced Practice Midwife | Admitting: Advanced Practice Midwife

## 2018-07-15 VITALS — BP 112/59 | HR 99 | Wt 151.0 lb

## 2018-07-15 DIAGNOSIS — Z3403 Encounter for supervision of normal first pregnancy, third trimester: Secondary | ICD-10-CM

## 2018-07-15 LAB — OB RESULTS CONSOLE GC/CHLAMYDIA: Gonorrhea: NEGATIVE

## 2018-07-15 LAB — OB RESULTS CONSOLE GBS: GBS: NEGATIVE

## 2018-07-15 MED ORDER — CYCLOBENZAPRINE HCL 10 MG PO TABS: 10.0000 mg | ORAL_TABLET | Freq: Three times a day (TID) | ORAL | 1 refills | Status: DC | PRN

## 2018-07-15 NOTE — Progress Notes (Signed)
Patient ID: Daphne Karrer, female   DOB: 05/31/1996, 22 y.o.   MRN: [id number]   PRENATAL VISIT NOTE  Subjective:  Trinitey Roache is a 22 y.o. G1P0000 at [redacted]w[redacted]d being seen today for ongoing prenatal care.  She is currently monitored for the following issues for this low-risk pregnancy and has Supervision of low-risk first pregnancy and Constipation in female on their problem list.  Patient reports backache.  Contractions: Irritability. Vag. Bleeding: None.  Movement: Present. Denies leaking of fluid.   The following portions of the patient's history were reviewed and updated as appropriate: allergies, current medications, past family history, past medical history, past social history, past surgical history and problem list. Problem list updated.  Objective:   Vitals:   07/15/18 0957  BP: (!) 112/59  Pulse: 99  Weight: 151 lb (68.5 kg)    Fetal Status: Fetal Heart Rate (bpm): 154 Fundal Height: 37 cm Movement: Present     General:  Alert, oriented and cooperative. Patient is in no acute distress.  Skin: Skin is warm and dry. No rash noted.   Cardiovascular: Normal heart rate noted  Respiratory: Normal respiratory effort, no problems with respiration noted  Abdomen: Soft, gravid, appropriate for gestational age.  Pain/Pressure: Present     Pelvic: Cervical exam performed Dilation: Closed Effacement (%): Thick Station: Ballotable  Extremities: Normal range of motion.  Edema: Trace  Mental Status: Normal mood and affect. Normal behavior. Normal judgment and thought content.   Assessment and Plan:  Pregnancy: G1P0000 at [redacted]w[redacted]d  1. Encounter for supervision of low-risk first pregnancy in third trimester - Routine care - Culture, beta strep (group b only) - GC/Chlamydia probe amp ([place])not at [facility] - refill for flexeril provided   Term labor symptoms and general obstetric precautions including but not limited to vaginal bleeding, contractions, leaking of fluid and fetal movement  were reviewed in detail with the patient. Please refer to After Visit Summary for other counseling recommendations.  Return in about 1 week (around 07/22/2018).  No future appointments.  Thressa Sheller, CNM

## 2018-07-15 NOTE — Progress Notes (Signed)
Refill on Flexeril, Pressure is uncomfortable, Support belt not working.

## 2018-07-15 NOTE — Patient Instructions (Signed)
Vaginal delivery means that you will give birth by pushing your baby out of your birth canal (vagina). A team of health care providers will help you before, during, and after vaginal delivery. Birth experiences are unique for every woman and every pregnancy, and birth experiences vary depending on where you choose to give birth. What should I do to prepare for my baby's birth? Before your baby is born, it is important to talk with your health care provider about:  Your labor and delivery preferences. These may include: ? Medicines that you may be given. ? How you will manage your pain. This might include non-medical pain relief techniques or injectable pain relief such as epidural analgesia. ? How you and your baby will be monitored during labor and delivery. ? Who may be in the labor and delivery room with you. ? Your feelings about surgical delivery of your baby (cesarean delivery, or C-section) if this becomes necessary. ? Your feelings about receiving donated blood through an IV tube (blood transfusion) if this becomes necessary.  Whether you are able: ? To take pictures or videos of the birth. ? To eat during labor and delivery. ? To move around, walk, or change positions during labor and delivery.  What to expect after your baby is born, such as: ? Whether delayed umbilical cord clamping and cutting is offered. ? Who will care for your baby right after birth. ? Medicines or tests that may be recommended for your baby. ? Whether breastfeeding is supported in your hospital or birth center. ? How long you will be in the hospital or birth center.  How any medical conditions you have may affect your baby or your labor and delivery experience.  To prepare for your baby's birth, you should also:  Attend all of your health care visits before delivery (prenatal visits) as recommended by your health care provider. This is important.  Prepare your home for your baby's arrival. Make sure  that you have: ? Diapers. ? Baby clothing. ? Feeding equipment. ? Safe sleeping arrangements for you and your baby.  Install a car seat in your vehicle. Have your car seat checked by a certified car seat installer to make sure that it is installed safely.  Think about who will help you with your new baby at home for at least the first several weeks after delivery.  What can I expect when I arrive at the birth center or hospital? Once you are in labor and have been admitted into the hospital or birth center, your health care provider may:  Review your pregnancy history and any concerns you have.  Insert an IV tube into one of your veins. This is used to give you fluids and medicines.  Check your blood pressure, pulse, temperature, and heart rate (vital signs).  Check whether your bag of water (amniotic sac) has broken (ruptured).  Talk with you about your birth plan and discuss pain control options.  Monitoring Your health care provider may monitor your contractions (uterine monitoring) and your baby's heart rate (fetal monitoring). You may need to be monitored:  Often, but not continuously (intermittently).  All the time or for long periods at a time (continuously). Continuous monitoring may be needed if: ? You are taking certain medicines, such as medicine to relieve pain or make your contractions stronger. ? You have pregnancy or labor complications.  Monitoring may be done by:  Placing a special stethoscope or a handheld monitoring device on your abdomen to check your   baby's heartbeat, and feeling your abdomen for contractions. This method of monitoring does not continuously record your baby's heartbeat or your contractions.  Placing monitors on your abdomen (external monitors) to record your baby's heartbeat and the frequency and length of contractions. You may not have to wear external monitors all the time.  Placing monitors inside of your uterus (internal monitors) to  record your baby's heartbeat and the frequency, length, and strength of your contractions. ? Your health care provider may use internal monitors if he or she needs more information about the strength of your contractions or your baby's heart rate. ? Internal monitors are put in place by passing a thin, flexible wire through your vagina and into your uterus. Depending on the type of monitor, it may remain in your uterus or on your baby's head until birth. ? Your health care provider will discuss the benefits and risks of internal monitoring with you and will ask for your permission before inserting the monitors.  Telemetry. This is a type of continuous monitoring that can be done with external or internal monitors. Instead of having to stay in bed, you are able to move around during telemetry. Ask your health care provider if telemetry is an option for you.  Physical exam Your health care provider may perform a physical exam. This may include:  Checking whether your baby is positioned: ? With the head toward your vagina (head-down). This is most common. ? With the head toward the top of your uterus (head-up or breech). If your baby is in a breech position, your health care provider may try to turn your baby to a head-down position so you can deliver vaginally. If it does not seem that your baby can be born vaginally, your provider may recommend surgery to deliver your baby. In rare cases, you may be able to deliver vaginally if your baby is head-up (breech delivery). ? Lying sideways (transverse). Babies that are lying sideways cannot be delivered vaginally.  Checking your cervix to determine: ? Whether it is thinning out (effacing). ? Whether it is opening up (dilating). ? How low your baby has moved into your birth canal.  What are the three stages of labor and delivery?  Normal labor and delivery is divided into the following three stages: Stage 1  Stage 1 is the longest stage of labor,  and it can last for hours or days. Stage 1 includes: ? Early labor. This is when contractions may be irregular, or regular and mild. Generally, early labor contractions are more than 10 minutes apart. ? Active labor. This is when contractions get longer, more regular, more frequent, and more intense. ? The transition phase. This is when contractions happen very close together, are very intense, and may last longer than during any other part of labor.  Contractions generally feel mild, infrequent, and irregular at first. They get stronger, more frequent (about every 2-3 minutes), and more regular as you progress from early labor through active labor and transition.  Many women progress through stage 1 naturally, but you may need help to continue making progress. If this happens, your health care provider may talk with you about: ? Rupturing your amniotic sac if it has not ruptured yet. ? Giving you medicine to help make your contractions stronger and more frequent.  Stage 1 ends when your cervix is completely dilated to 4 inches (10 cm) and completely effaced. This happens at the end of the transition phase. Stage 2  Once your cervix   is completely effaced and dilated to 4 inches (10 cm), you may start to feel an urge to push. It is common for the body to naturally take a rest before feeling the urge to push, especially if you received an epidural or certain other pain medicines. This rest period may last for up to 1-2 hours, depending on your unique labor experience.  During stage 2, contractions are generally less painful, because pushing helps relieve contraction pain. Instead of contraction pain, you may feel stretching and burning pain, especially when the widest part of your baby's head passes through the vaginal opening (crowning).  Your health care provider will closely monitor your pushing progress and your baby's progress through the vagina during stage 2.  Your health care provider may  massage the area of skin between your vaginal opening and anus (perineum) or apply warm compresses to your perineum. This helps it stretch as the baby's head starts to crown, which can help prevent perineal tearing. ? In some cases, an incision may be made in your perineum (episiotomy) to allow the baby to pass through the vaginal opening. An episiotomy helps to make the opening of the vagina larger to allow more room for the baby to fit through.  It is very important to breathe and focus so your health care provider can control the delivery of your baby's head. Your health care provider may have you decrease the intensity of your pushing, to help prevent perineal tearing.  After delivery of your baby's head, the shoulders and the rest of the body generally deliver very quickly and without difficulty.  Once your baby is delivered, the umbilical cord may be cut right away, or this may be delayed for 1-2 minutes, depending on your baby's health. This may vary among health care providers, hospitals, and birth centers.  If you and your baby are healthy enough, your baby may be placed on your chest or abdomen to help maintain the baby's temperature and to help you bond with each other. Some mothers and babies start breastfeeding at this time. Your health care team will dry your baby and help keep your baby warm during this time.  Your baby may need immediate care if he or she: ? Showed signs of distress during labor. ? Has a medical condition. ? Was born too early (prematurely). ? Had a bowel movement before birth (meconium). ? Shows signs of difficulty transitioning from being inside the uterus to being outside of the uterus. If you are planning to breastfeed, your health care team will help you begin a feeding. Stage 3  The third stage of labor starts immediately after the birth of your baby and ends after you deliver the placenta. The placenta is an organ that develops during pregnancy to provide  oxygen and nutrients to your baby in the womb.  Delivering the placenta may require some pushing, and you may have mild contractions. Breastfeeding can stimulate contractions to help you deliver the placenta.  After the placenta is delivered, your uterus should tighten (contract) and become firm. This helps to stop bleeding in your uterus. To help your uterus contract and to control bleeding, your health care provider may: ? Give you medicine by injection, through an IV tube, by mouth, or through your rectum (rectally). ? Massage your abdomen or perform a vaginal exam to remove any blood clots that are left in your uterus. ? Empty your bladder by placing a thin, flexible tube (catheter) into your bladder. ? Encourage you to   breastfeed your baby. After labor is over, you and your baby will be monitored closely to ensure that you are both healthy until you are ready to go home. Your health care team will teach you how to care for yourself and your baby. This information is not intended to replace advice given to you by your health care provider. Make sure you discuss any questions you have with your health care provider. Document Released: 06/06/2008 Document Revised: 03/17/2016 Document Reviewed: 09/12/2015 Elsevier Interactive Patient Education  2018 [employer]  

## 2018-07-16 ENCOUNTER — Encounter (HOSPITAL_COMMUNITY): Payer: Self-pay

## 2018-07-16 ENCOUNTER — Inpatient Hospital Stay (HOSPITAL_COMMUNITY)
Admission: AD | Admit: 2018-07-16 | Discharge: 2018-07-16 | Disposition: A | Discharge status: Home / Self Care | Payer: Medicaid Other | Source: Ambulatory Visit | Attending: Obstetrics and Gynecology | Admitting: Obstetrics and Gynecology

## 2018-07-16 DIAGNOSIS — O4703 False labor before 37 completed weeks of gestation, third trimester: Secondary | ICD-10-CM | POA: Insufficient documentation

## 2018-07-16 DIAGNOSIS — R1030 Lower abdominal pain, unspecified: Secondary | ICD-10-CM | POA: Diagnosis present

## 2018-07-16 DIAGNOSIS — O26853 Spotting complicating pregnancy, third trimester: Secondary | ICD-10-CM | POA: Diagnosis not present

## 2018-07-16 DIAGNOSIS — O26893 Other specified pregnancy related conditions, third trimester: Secondary | ICD-10-CM | POA: Diagnosis present

## 2018-07-16 DIAGNOSIS — O368130 Decreased fetal movements, third trimester, not applicable or unspecified: Secondary | ICD-10-CM

## 2018-07-16 DIAGNOSIS — Z3A36 36 weeks gestation of pregnancy: Secondary | ICD-10-CM | POA: Insufficient documentation

## 2018-07-16 DIAGNOSIS — O479 False labor, unspecified: Secondary | ICD-10-CM

## 2018-07-16 LAB — URINALYSIS, ROUTINE W REFLEX MICROSCOPIC
BILIRUBIN URINE: NEGATIVE
GLUCOSE, UA: NEGATIVE mg/dL
KETONES UR: NEGATIVE mg/dL
NITRITE: NEGATIVE
PH: 7 (ref 5.0–8.0)
PROTEIN: NEGATIVE mg/dL
Specific Gravity, Urine: 1.004 — ABNORMAL LOW (ref 1.005–1.030)

## 2018-07-16 LAB — GC/CHLAMYDIA PROBE AMP (~~LOC~~) NOT AT ARMC
Chlamydia: NEGATIVE
NEISSERIA GONORRHEA: NEGATIVE

## 2018-07-16 NOTE — Discharge Instructions (Signed)
Braxton Hicks Contractions °Contractions of the uterus can occur throughout pregnancy, but they are not always a sign that you are in labor. You may have practice contractions called Braxton Hicks contractions. These false labor contractions are sometimes confused with true labor. °What are Braxton Hicks contractions? °Braxton Hicks contractions are tightening movements that occur in the muscles of the uterus before labor. Unlike true labor contractions, these contractions do not result in opening (dilation) and thinning of the cervix. Toward the end of pregnancy (32-34 weeks), Braxton Hicks contractions can happen more often and may become stronger. These contractions are sometimes difficult to tell apart from true labor because they can be very uncomfortable. You should not feel embarrassed if you go to the hospital with false labor. °Sometimes, the only way to tell if you are in true labor is for your health care provider to look for changes in the cervix. The health care provider will do a physical exam and may monitor your contractions. If you are not in true labor, the exam should show that your cervix is not dilating and your water has not broken. °If there are other health problems associated with your pregnancy, it is completely safe for you to be sent home with false labor. You may continue to have Braxton Hicks contractions until you go into true labor. °How to tell the difference between true labor and false labor °True labor °· Contractions last 30-70 seconds. °· Contractions become very regular. °· Discomfort is usually felt in the top of the uterus, and it spreads to the lower abdomen and low back. °· Contractions do not go away with walking. °· Contractions usually become more intense and increase in frequency. °· The cervix dilates and gets thinner. °False labor °· Contractions are usually shorter and not as strong as true labor contractions. °· Contractions are usually irregular. °· Contractions  are often felt in the front of the lower abdomen and in the groin. °· Contractions may go away when you walk around or change positions while lying down. °· Contractions get weaker and are shorter-lasting as time goes on. °· The cervix usually does not dilate or become thin. °Follow these instructions at home: °· Take over-the-counter and prescription medicines only as told by your health care provider. °· Keep up with your usual exercises and follow other instructions from your health care provider. °· Eat and drink lightly if you think you are going into labor. °· If Braxton Hicks contractions are making you uncomfortable: °? Change your position from lying down or resting to walking, or change from walking to resting. °? Sit and rest in a tub of warm water. °? Drink enough fluid to keep your urine pale yellow. Dehydration may cause these contractions. °? Do slow and deep breathing several times an hour. °· Keep all follow-up prenatal visits as told by your health care provider. This is important. °Contact a health care provider if: °· You have a fever. °· You have continuous pain in your abdomen. °Get help right away if: °· Your contractions become stronger, more regular, and closer together. °· You have fluid leaking or gushing from your vagina. °· You pass blood-tinged mucus (bloody show). °· You have bleeding from your vagina. °· You have low back pain that you never had before. °· You feel your baby’s head pushing down and causing pelvic pressure. °· Your baby is not moving inside you as much as it used to. °Summary °· Contractions that occur before labor are called Braxton   Hicks contractions, false labor, or practice contractions. °· Braxton Hicks contractions are usually shorter, weaker, farther apart, and less regular than true labor contractions. True labor contractions usually become progressively stronger and regular and they become more frequent. °· Manage discomfort from Braxton Hicks contractions by  changing position, resting in a warm bath, drinking plenty of water, or practicing deep breathing. °This information is not intended to replace advice given to you by your health care provider. Make sure you discuss any questions you have with your health care provider. °Document Released: 01/11/2017 Document Revised: 01/11/2017 Document Reviewed: 01/11/2017 °Elsevier Interactive Patient Education © 2018 Elsevier Inc. ° °

## 2018-07-16 NOTE — MAU Note (Signed)
Pt reports she was in the office yesterday and they checked her cervix and she has had spotting since then. The office told her to come in to be checked. Lower abd pain x one week.

## 2018-07-16 NOTE — MAU Provider Note (Signed)
CC:  Chief Complaint  Patient presents with  . Vaginal Bleeding  . Abdominal Pain  . Decreased Fetal Movement     First Provider Initiated Contact with Patient 07/16/18 1532      HPI: Beth Bray is a 22 y.o. year old G21P0000 female at [redacted]w[redacted]d weeks gestation who presents to MAU reporting bleeding like a period yesterday and continued spotting today, decrease fetal mvmt today and ongoing, continuous low abd cramping. Get [facility] at [facility]. Uncomplicated pregnancy   Associated Sx: Neg for fever, chills, urinary complaints.  Leaking of fluid: denies Fetal movement: Decreased  Hasn't eaten anything today.   O:  Patient Vitals for the past 24 hrs:  BP Temp Temp src Pulse Resp SpO2 Height Weight  07/16/18 1458 111/62 98.7 F (37.1 C) Oral 99 16 100 % 5\' 3"  (1.6 m) 69.9 kg    General: NAD Heart: Regular rate Lungs: Normal rate and effort Abd: Soft, NT, Gravid, S=D Pelvic: NEFG, Neg pooling, scant tan blood.     EFM: 150, Moderate variability, 15 x 15 accelerations, no decelerations Toco: Frequent UI.   Results for orders placed or performed during the hospital encounter of 07/16/18 (from the past 24 hour(s))  Urinalysis, Routine w reflex microscopic     Status: Abnormal   Collection Time: 07/16/18  3:35 PM  Result Value Ref Range   Color, Urine STRAW (A) YELLOW   APPearance CLEAR CLEAR   Specific Gravity, Urine 1.004 (L) 1.005 - 1.030   pH 7.0 5.0 - 8.0   Glucose, UA NEGATIVE NEGATIVE mg/dL   Hgb urine dipstick MODERATE (A) NEGATIVE   Bilirubin Urine NEGATIVE NEGATIVE   Ketones, ur NEGATIVE NEGATIVE mg/dL   Protein, ur NEGATIVE NEGATIVE mg/dL   Nitrite NEGATIVE NEGATIVE   Leukocytes, UA TRACE (A) NEGATIVE   RBC / HPF 0-5 0 - 5 RBC/hpf   WBC, UA 0-5 0 - 5 WBC/hpf   Bacteria, UA RARE (A) NONE SEEN   Squamous Epithelial / LPF 0-5 0 - 5   Ca Oxalate Crys, UA PRESENT      Orders Placed This Encounter  Procedures  . Culture, OB Urine  . Urinalysis, Routine w reflex  microscopic  Prolonged monitoring  Push fluids  Pt feeling baby move well in MAU.   A: [redacted]w[redacted]d week IUP Braxton Hicks Normal bloody show. No evidence of abruption. FHR reactive  P: Discharge home in stable condition. Labor precautions and fetal kick counts. Follow-up as scheduled for prenatal visit or sooner as needed if symptoms worsen. Return to maternity admissions as needed if symptoms worsen.  Katrinka Blazing, [place], CNM 07/16/2018 3:32 PM  3

## 2018-07-17 LAB — CULTURE, OB URINE: Culture: NO GROWTH

## 2018-07-19 LAB — CULTURE, BETA STREP (GROUP B ONLY): Strep Gp B Culture: NEGATIVE

## 2018-07-23 ENCOUNTER — Ambulatory Visit (INDEPENDENT_AMBULATORY_CARE_PROVIDER_SITE_OTHER): Payer: Medicaid Other | Admitting: Advanced Practice Midwife

## 2018-07-23 ENCOUNTER — Encounter: Payer: Self-pay | Admitting: Advanced Practice Midwife

## 2018-07-23 VITALS — BP 113/61 | HR 102 | Wt 157.6 lb

## 2018-07-23 DIAGNOSIS — Z3403 Encounter for supervision of normal first pregnancy, third trimester: Secondary | ICD-10-CM

## 2018-07-23 NOTE — Patient Instructions (Signed)
Braxton Hicks Contractions °Contractions of the uterus can occur throughout pregnancy, but they are not always a sign that you are in labor. You may have practice contractions called Braxton Hicks contractions. These false labor contractions are sometimes confused with true labor. °What are Braxton Hicks contractions? °Braxton Hicks contractions are tightening movements that occur in the muscles of the uterus before labor. Unlike true labor contractions, these contractions do not result in opening (dilation) and thinning of the cervix. Toward the end of pregnancy (32-34 weeks), Braxton Hicks contractions can happen more often and may become stronger. These contractions are sometimes difficult to tell apart from true labor because they can be very uncomfortable. You should not feel embarrassed if you go to the hospital with false labor. °Sometimes, the only way to tell if you are in true labor is for your health care provider to look for changes in the cervix. The health care provider will do a physical exam and may monitor your contractions. If you are not in true labor, the exam should show that your cervix is not dilating and your water has not broken. °If there are other health problems associated with your pregnancy, it is completely safe for you to be sent home with false labor. You may continue to have Braxton Hicks contractions until you go into true labor. °How to tell the difference between true labor and false labor °True labor °· Contractions last 30-70 seconds. °· Contractions become very regular. °· Discomfort is usually felt in the top of the uterus, and it spreads to the lower abdomen and low back. °· Contractions do not go away with walking. °· Contractions usually become more intense and increase in frequency. °· The cervix dilates and gets thinner. °False labor °· Contractions are usually shorter and not as strong as true labor contractions. °· Contractions are usually irregular. °· Contractions  are often felt in the front of the lower abdomen and in the groin. °· Contractions may go away when you walk around or change positions while lying down. °· Contractions get weaker and are shorter-lasting as time goes on. °· The cervix usually does not dilate or become thin. °Follow these instructions at home: °· Take over-the-counter and prescription medicines only as told by your health care provider. °· Keep up with your usual exercises and follow other instructions from your health care provider. °· Eat and drink lightly if you think you are going into labor. °· If Braxton Hicks contractions are making you uncomfortable: °? Change your position from lying down or resting to walking, or change from walking to resting. °? Sit and rest in a tub of warm water. °? Drink enough fluid to keep your urine pale yellow. Dehydration may cause these contractions. °? Do slow and deep breathing several times an hour. °· Keep all follow-up prenatal visits as told by your health care provider. This is important. °Contact a health care provider if: °· You have a fever. °· You have continuous pain in your abdomen. °Get help right away if: °· Your contractions become stronger, more regular, and closer together. °· You have fluid leaking or gushing from your vagina. °· You pass blood-tinged mucus (bloody show). °· You have bleeding from your vagina. °· You have low back pain that you never had before. °· You feel your baby’s head pushing down and causing pelvic pressure. °· Your baby is not moving inside you as much as it used to. °Summary °· Contractions that occur before labor are called Braxton   Hicks contractions, false labor, or practice contractions. °· Braxton Hicks contractions are usually shorter, weaker, farther apart, and less regular than true labor contractions. True labor contractions usually become progressively stronger and regular and they become more frequent. °· Manage discomfort from Braxton Hicks contractions by  changing position, resting in a warm bath, drinking plenty of water, or practicing deep breathing. °This information is not intended to replace advice given to you by your health care provider. Make sure you discuss any questions you have with your health care provider. °Document Released: 01/11/2017 Document Revised: 01/11/2017 Document Reviewed: 01/11/2017 °Elsevier Interactive Patient Education © 2018 Elsevier Inc. ° °

## 2018-07-23 NOTE — Progress Notes (Signed)
Pt declined speaking with integrated behavioral health clinician.  

## 2018-07-23 NOTE — Progress Notes (Signed)
   PRENATAL VISIT NOTE  Subjective:  Lexine BatonChante Hanser is a 22 y.o. G1P0000 at 673w2d being seen today for ongoing prenatal care.  She is currently monitored for the following issues for this low-risk pregnancy and has Supervision of low-risk first pregnancy and Constipation in female on their problem list.  Patient reports occasional contractions and low abd pain  Contractions: Irregular. Vag. Bleeding: None.  Movement: Present. Denies leaking of fluid.   The following portions of the patient's history were reviewed and updated as appropriate: allergies, current medications, past family history, past medical history, past social history, past surgical history and problem list. Problem list updated.  Objective:   Vitals:   07/23/18 1448  BP: 113/61  Pulse: (!) 102  Weight: 157 lb 9.6 oz (71.5 kg)    Fetal Status: Fetal Heart Rate (bpm): 140   Movement: Present     General:  Alert, oriented and cooperative. Patient is in no acute distress.  Skin: Skin is warm and dry. No rash noted.   Cardiovascular: Normal heart rate noted  Respiratory: Normal respiratory effort, no problems with respiration noted  Abdomen: Soft, gravid, appropriate for gestational age.  Pain/Pressure: Present     Pelvic: Cervical exam performed      closed/thick, vtx  Extremities: Normal range of motion.  Edema: Trace  Mental Status: Normal mood and affect. Normal behavior. Normal judgment and thought content.   Assessment and Plan:  Pregnancy: G1P0000 at 3973w2d 1. Encounter for supervision of low-risk first pregnancy in third trimester  Term labor symptoms and general obstetric precautions including but not limited to vaginal bleeding, contractions, leaking of fluid and fetal movement were reviewed in detail with the patient. Please refer to After Visit Summary for other counseling recommendations.  No follow-ups on file.  Future Appointments  Date Time Provider Department Center  07/30/2018  9:15 AM Katrinka BlazingSmith,  [place], CNM [facility]    [place] Yevette Knust, [place]

## 2018-07-30 ENCOUNTER — Ambulatory Visit (INDEPENDENT_AMBULATORY_CARE_PROVIDER_SITE_OTHER): Payer: Medicaid Other | Admitting: Advanced Practice Midwife

## 2018-07-30 VITALS — BP 121/67 | HR 104 | Wt 156.8 lb

## 2018-07-30 DIAGNOSIS — Z3A38 38 weeks gestation of pregnancy: Secondary | ICD-10-CM

## 2018-07-30 DIAGNOSIS — Z3403 Encounter for supervision of normal first pregnancy, third trimester: Secondary | ICD-10-CM

## 2018-07-30 NOTE — Progress Notes (Signed)
   PRENATAL VISIT NOTE  Subjective:  Beth Bray is a 22 y.o. G1P0000 at [redacted]w[redacted]d being seen today for ongoing prenatal care.  She is currently monitored for the following issues for this low-risk pregnancy and has Supervision of low-risk first pregnancy and Constipation in female on their problem list.  Patient reports occasional contractions.  Contractions: Irregular. Vag. Bleeding: None.  Movement: Present. Denies leaking of fluid.   The following portions of the patient's history were reviewed and updated as appropriate: allergies, current medications, past family history, past medical history, past social history, past surgical history and problem list. Problem list updated.  Objective:   Vitals:   07/30/18 0944  BP: 121/67  Pulse: (!) 104  Weight: 156 lb 12.8 oz (71.1 kg)    Fetal Status: Fetal Heart Rate (bpm): 140 Fundal Height: 39 cm Movement: Present  Presentation: Vertex  General:  Alert, oriented and cooperative. Patient is in no acute distress.  Skin: Skin is warm and dry. No rash noted.   Cardiovascular: Normal heart rate noted  Respiratory: Normal respiratory effort, no problems with respiration noted  Abdomen: Soft, gravid, appropriate for gestational age.  Pain/Pressure: Present     Pelvic: Cervical exam performed Dilation: Fingertip Effacement (%): Thick Station: Ballotable  Extremities: Normal range of motion.  Edema: Trace  Mental Status: Normal mood and affect. Normal behavior. Normal judgment and thought content.   Assessment and Plan:  Pregnancy: G1P0000 at 889w2d 1. Encounter for supervision of low-risk first pregnancy in third trimester   Term labor symptoms and general obstetric precautions including but not limited to vaginal bleeding, contractions, leaking of fluid and fetal movement were reviewed in detail with the patient. Please refer to After Visit Summary for other counseling recommendations.  Return in about 1 week (around 08/06/2018) for  ROB.  No future appointments.  [place] Sye Schroepfer, CNM

## 2018-07-30 NOTE — Patient Instructions (Signed)
Before Baby Comes Home  Before your baby arrives it is important to:   Have all of the supplies that you will need to care for your baby.   Know where to go if there is an emergency.   Discuss the baby's arrival with other family members.    What supplies will I need?    It is recommended that you have the following supplies:  Large Items   Crib.   Crib mattress.   Rear-facing infant car seat. If possible, have a trained professional check to make sure that it is installed correctly.    Feeding   6-8 bottles that are 4-5 oz in size.   6-8 nipples.   Bottle brush.   Sterilizer, or a large pan or kettle with a lid.   A way to boil and cool water.   If you will be breastfeeding:  ? Breast pump.  ? Nipple cream.  ? Nursing bra.  ? Breast pads.  ? Breast shields.   If you will be formula feeding:  ? Formula.  ? Measuring cups.  ? Measuring spoons.    Bathing   Mild baby soap and baby shampoo.   Petroleum jelly.   Soft cloth towel and washcloth.   Hooded towel.   Cotton balls.   Bath basin.    Other Supplies   Rectal thermometer.   Bulb syringe.   Baby wipes or washcloths for diaper changes.   Diaper bag.   Changing pad.   Clothing, including one-piece outfits and pajamas.   Baby nail clippers.   Receiving blankets.   Mattress pad and sheets for the crib.   Night-light for the baby's room.   Baby monitor.   2 or 3 pacifiers.   Either 24-36 cloth diapers and waterproof diaper covers or a box of disposable diapers. You may need to use as many as 10-12 diapers per day.    How do I prepare for an emergency?  Prepare for an emergency by:   Knowing how to get to the nearest hospital.   Listing the phone numbers of your baby's health care providers near your home phone and in your cell phone.    How do I prepare my family?   Decide how to handle visitors.   If you have other children:  ? Talk with them about the baby coming home. Ask them how they feel about it.  ? Read a book together about  being a new big brother or sister.  ? Find ways to let them help you prepare for the new baby.  ? Have someone ready to care for them while you are in the hospital.  This information is not intended to replace advice given to you by your health care provider. Make sure you discuss any questions you have with your health care provider.  Document Released: 08/10/2008 Document Revised: 02/03/2016 Document Reviewed: 08/05/2014  Elsevier Interactive Patient Education  2018 Elsevier Inc.

## 2018-08-06 ENCOUNTER — Ambulatory Visit (INDEPENDENT_AMBULATORY_CARE_PROVIDER_SITE_OTHER): Payer: Medicaid Other | Admitting: Advanced Practice Midwife

## 2018-08-06 VITALS — BP 119/75 | HR 94 | Wt 160.0 lb

## 2018-08-06 DIAGNOSIS — Z3A39 39 weeks gestation of pregnancy: Secondary | ICD-10-CM

## 2018-08-06 DIAGNOSIS — Z3403 Encounter for supervision of normal first pregnancy, third trimester: Secondary | ICD-10-CM

## 2018-08-06 NOTE — Patient Instructions (Signed)

## 2018-08-06 NOTE — Progress Notes (Signed)
   PRENATAL VISIT NOTE  Subjective:  Beth Bray is a 22 y.o. G1P0000 at 1145w2d being seen today for ongoing prenatal care.  She is currently monitored for the following issues for this low-risk pregnancy and has Supervision of low-risk first pregnancy and Constipation in female on their problem list.  Patient reports occasional contractions.  Contractions: Irregular. Vag. Bleeding: None.  Movement: Present. Denies leaking of fluid.   The following portions of the patient's history were reviewed and updated as appropriate: allergies, current medications, past family history, past medical history, past social history, past surgical history and problem list. Problem list updated.  Objective:   Vitals:   08/06/18 0944  BP: 119/75  Pulse: 94  Weight: 160 lb (72.6 kg)    Fetal Status: Fetal Heart Rate (bpm): 141 Fundal Height: 40 cm Movement: Present  Presentation: Vertex  General:  Alert, oriented and cooperative. Patient is in no acute distress.  Skin: Skin is warm and dry. No rash noted.   Cardiovascular: Normal heart rate noted  Respiratory: Normal respiratory effort, no problems with respiration noted  Abdomen: Soft, gravid, appropriate for gestational age.  Pain/Pressure: Present     Pelvic: Cervical exam performed Dilation: 1 Effacement (%): Thick Station: Ballotable  Extremities: Normal range of motion.  Edema: Trace  Mental Status: Normal mood and affect. Normal behavior. Normal judgment and thought content.   Assessment and Plan:  Pregnancy: G1P0000 at 7645w2d  1. Encounter for supervision of low-risk first pregnancy in third trimester - Comfort measures - Postdates induction schedule. Pt needs Saturday which is 40.6. Consider foley ripening   Term labor symptoms and general obstetric precautions including but not limited to vaginal bleeding, contractions, leaking of fluid and fetal movement were reviewed in detail with the patient. Please refer to After Visit Summary for  other counseling recommendations.  Return in about 1 week (around 08/13/2018) for NST/AFI, ROB.  Future Appointments  Date Time Provider Department Center  08/15/2018  9:15 AM Allie Bossierove, Myra C, [facility]  08/15/2018 11:15 AM [facility]  08/17/2018 12:00 AM [facility] RO[facility] None    [place], CNM

## 2018-08-07 ENCOUNTER — Telehealth (HOSPITAL_COMMUNITY): Payer: Self-pay | Admitting: *Deleted

## 2018-08-07 NOTE — Telephone Encounter (Signed)
Preadmission screen  

## 2018-08-11 ENCOUNTER — Inpatient Hospital Stay (HOSPITAL_COMMUNITY)
Admission: AD | Admit: 2018-08-11 | Discharge: 2018-08-15 | DRG: 788 | Disposition: A | Discharge status: Home / Self Care | Payer: Medicaid Other | Attending: Obstetrics and Gynecology | Admitting: Obstetrics and Gynecology

## 2018-08-11 ENCOUNTER — Inpatient Hospital Stay (HOSPITAL_COMMUNITY): Payer: Medicaid Other | Admitting: Anesthesiology

## 2018-08-11 ENCOUNTER — Encounter (HOSPITAL_COMMUNITY): Payer: Self-pay

## 2018-08-11 DIAGNOSIS — O9952 Diseases of the respiratory system complicating childbirth: Secondary | ICD-10-CM | POA: Diagnosis present

## 2018-08-11 DIAGNOSIS — O9081 Anemia of the puerperium: Secondary | ICD-10-CM | POA: Diagnosis not present

## 2018-08-11 DIAGNOSIS — Z3A41 41 weeks gestation of pregnancy: Secondary | ICD-10-CM | POA: Diagnosis not present

## 2018-08-11 DIAGNOSIS — O9962 Diseases of the digestive system complicating childbirth: Secondary | ICD-10-CM | POA: Diagnosis present

## 2018-08-11 DIAGNOSIS — Z3A40 40 weeks gestation of pregnancy: Secondary | ICD-10-CM | POA: Diagnosis not present

## 2018-08-11 DIAGNOSIS — O99344 Other mental disorders complicating childbirth: Secondary | ICD-10-CM | POA: Diagnosis present

## 2018-08-11 DIAGNOSIS — Z3403 Encounter for supervision of normal first pregnancy, third trimester: Secondary | ICD-10-CM

## 2018-08-11 DIAGNOSIS — F419 Anxiety disorder, unspecified: Secondary | ICD-10-CM | POA: Diagnosis present

## 2018-08-11 DIAGNOSIS — Z3400 Encounter for supervision of normal first pregnancy, unspecified trimester: Secondary | ICD-10-CM

## 2018-08-11 DIAGNOSIS — Z3483 Encounter for supervision of other normal pregnancy, third trimester: Secondary | ICD-10-CM | POA: Diagnosis present

## 2018-08-11 DIAGNOSIS — J45909 Unspecified asthma, uncomplicated: Secondary | ICD-10-CM | POA: Diagnosis present

## 2018-08-11 DIAGNOSIS — K219 Gastro-esophageal reflux disease without esophagitis: Secondary | ICD-10-CM | POA: Diagnosis present

## 2018-08-11 DIAGNOSIS — O480 Post-term pregnancy: Secondary | ICD-10-CM | POA: Diagnosis not present

## 2018-08-11 DIAGNOSIS — D649 Anemia, unspecified: Secondary | ICD-10-CM | POA: Diagnosis not present

## 2018-08-11 LAB — CBC
HCT: 29.6 % — ABNORMAL LOW (ref 36.0–46.0)
Hemoglobin: 9 g/dL — ABNORMAL LOW (ref 12.0–15.0)
MCH: 23 pg — ABNORMAL LOW (ref 26.0–34.0)
MCHC: 30.4 g/dL (ref 30.0–36.0)
MCV: 75.7 fL — ABNORMAL LOW (ref 80.0–100.0)
Platelets: 266 10*3/uL (ref 150–400)
RBC: 3.91 MIL/uL (ref 3.87–5.11)
RDW: 16.9 % — ABNORMAL HIGH (ref 11.5–15.5)
WBC: 6.4 10*3/uL (ref 4.0–10.5)
nRBC: 0.7 % — ABNORMAL HIGH (ref 0.0–0.2)

## 2018-08-11 MED ORDER — FENTANYL 2.5 MCG/ML BUPIVACAINE 1/10 % EPIDURAL INFUSION (WH - ANES)
14.0000 mL/h | INTRAMUSCULAR | Status: DC | PRN
  Administered 2018-08-11 – 2018-08-12 (×4): 14 mL/h via EPIDURAL
  Filled 2018-08-11 (×3): qty 100

## 2018-08-11 MED ORDER — LACTATED RINGERS IV SOLN
INTRAVENOUS | Status: DC
  Administered 2018-08-11 – 2018-08-12 (×5): via INTRAVENOUS

## 2018-08-11 MED ORDER — OXYCODONE-ACETAMINOPHEN 5-325 MG PO TABS: 2.0000 | ORAL_TABLET | ORAL | Status: DC | PRN

## 2018-08-11 MED ORDER — OXYCODONE-ACETAMINOPHEN 5-325 MG PO TABS: 1.0000 | ORAL_TABLET | ORAL | Status: DC | PRN

## 2018-08-11 MED ORDER — LIDOCAINE HCL (PF) 1 % IJ SOLN: 30.0000 mL | INTRAMUSCULAR | Status: DC | PRN

## 2018-08-11 MED ORDER — OXYTOCIN 40 UNITS IN LACTATED RINGERS INFUSION - SIMPLE MED: 2.5000 [IU]/h | INTRAVENOUS | Status: DC

## 2018-08-11 MED ORDER — EPHEDRINE 5 MG/ML INJ: 10.0000 mg | INTRAVENOUS | Status: DC | PRN

## 2018-08-11 MED ORDER — FENTANYL CITRATE (PF) 100 MCG/2ML IJ SOLN
50.0000 ug | INTRAMUSCULAR | Status: DC | PRN
  Administered 2018-08-11: 100 ug via INTRAVENOUS
  Filled 2018-08-11 (×2): qty 2

## 2018-08-11 MED ORDER — MISOPROSTOL 25 MCG QUARTER TABLET: 25.0000 ug | ORAL_TABLET | ORAL | Status: DC | PRN

## 2018-08-11 MED ORDER — OXYTOCIN 40 UNITS IN LACTATED RINGERS INFUSION - SIMPLE MED
1.0000 m[IU]/min | INTRAVENOUS | Status: DC
  Administered 2018-08-11: 2 m[IU]/min via INTRAVENOUS
  Administered 2018-08-12: 3 m[IU]/min via INTRAVENOUS
  Administered 2018-08-12: 2 m[IU]/min via INTRAVENOUS
  Filled 2018-08-11: qty 1000

## 2018-08-11 MED ORDER — OXYTOCIN BOLUS FROM INFUSION: 500.0000 mL | Freq: Once | INTRAVENOUS | Status: DC

## 2018-08-11 MED ORDER — SOD CITRATE-CITRIC ACID 500-334 MG/5ML PO SOLN
30.0000 mL | ORAL | Status: DC | PRN
  Administered 2018-08-12: 30 mL via ORAL
  Filled 2018-08-11: qty 15

## 2018-08-11 MED ORDER — LACTATED RINGERS IV SOLN
500.0000 mL | Freq: Once | INTRAVENOUS | Status: AC
  Administered 2018-08-11: 500 mL via INTRAVENOUS

## 2018-08-11 MED ORDER — FLEET ENEMA 7-19 GM/118ML RE ENEM: 1.0000 | ENEMA | RECTAL | Status: DC | PRN

## 2018-08-11 MED ORDER — BUPIVACAINE HCL (PF) 0.25 % IJ SOLN
INTRAMUSCULAR | Status: DC | PRN
  Administered 2018-08-11 – 2018-08-12 (×2): 8 mL via EPIDURAL

## 2018-08-11 MED ORDER — LIDOCAINE HCL (PF) 1 % IJ SOLN
INTRAMUSCULAR | Status: DC | PRN
  Administered 2018-08-11 (×2): 5 mL via EPIDURAL

## 2018-08-11 MED ORDER — ACETAMINOPHEN 325 MG PO TABS: 650.0000 mg | ORAL_TABLET | ORAL | Status: DC | PRN

## 2018-08-11 MED ORDER — PHENYLEPHRINE 40 MCG/ML (10ML) SYRINGE FOR IV PUSH (FOR BLOOD PRESSURE SUPPORT)
PREFILLED_SYRINGE | INTRAVENOUS | Status: AC
  Filled 2018-08-11: qty 10

## 2018-08-11 MED ORDER — FENTANYL 2.5 MCG/ML BUPIVACAINE 1/10 % EPIDURAL INFUSION (WH - ANES)
INTRAMUSCULAR | Status: AC
  Filled 2018-08-11: qty 100

## 2018-08-11 MED ORDER — ONDANSETRON HCL 4 MG/2ML IJ SOLN
4.0000 mg | Freq: Four times a day (QID) | INTRAMUSCULAR | Status: DC | PRN
  Administered 2018-08-12: 4 mg via INTRAVENOUS

## 2018-08-11 MED ORDER — PHENYLEPHRINE 40 MCG/ML (10ML) SYRINGE FOR IV PUSH (FOR BLOOD PRESSURE SUPPORT): 80.0000 ug | PREFILLED_SYRINGE | INTRAVENOUS | Status: DC | PRN

## 2018-08-11 MED ORDER — DIPHENHYDRAMINE HCL 50 MG/ML IJ SOLN
12.5000 mg | INTRAMUSCULAR | Status: AC | PRN
  Administered 2018-08-11: 12.5 mg via INTRAVENOUS
  Administered 2018-08-12 (×2): 10 mg via INTRAVENOUS
  Filled 2018-08-11: qty 1

## 2018-08-11 MED ORDER — LACTATED RINGERS IV SOLN
500.0000 mL | INTRAVENOUS | Status: DC | PRN
  Administered 2018-08-11: 300 mL via INTRAVENOUS

## 2018-08-11 MED ORDER — TERBUTALINE SULFATE 1 MG/ML IJ SOLN: 0.2500 mg | Freq: Once | INTRAMUSCULAR | Status: DC | PRN

## 2018-08-11 NOTE — Progress Notes (Signed)
Beth Bray is a 22 y.o. G1P0000 at 8535w0d by LMP admitted for early labor with nonreactive NST.    Subjective: Pt crying with pain. Reports pressure in her low abdomen and pelvis and back pain.  Family in room for support.    Objective: BP (!) 108/53   Pulse (!) 112   Temp (!) 97.5 F (36.4 C) (Axillary)   Resp 18   Ht 5\' 3"  (1.6 m)   Wt 75.9 kg   LMP 11/04/2017   SpO2 100%   BMI 29.63 kg/m  I/O last 3 completed shifts: In: -  Out: 1200 [Urine:1200] Total I/O In: -  Out: 700 [Urine:700]  FHT:  FHR: 150 bpm, variability: moderate,  accelerations:  Present,  decelerations:  Absent UC:   irregular, every 2-5 minutes.  MVUs 180. SVE:   Dilation: 4 Effacement (%): 90 Station: -2 Exam by:: Sharen CounterLisa Leftwich Kirby, CNM  Labs: Lab Results  Component Value Date   WBC 6.4 08/11/2018   HGB 9.0 (L) 08/11/2018   HCT 29.6 (L) 08/11/2018   MCV 75.7 (L) 08/11/2018   PLT 266 08/11/2018    Assessment / Plan: Augmentation of labor progressing well.  Fetal head position is transverse, facing maternal left and -2 station.  Pt pelvic pain and lack of progress likely related to malposition.  Anesthesia paged for epidural dose and RN to reposition pt and use peanut ball to increase pelvic diameter/encourage rotation of fetal head.   Labor: Arrest of labor with fetal head malposition Preeclampsia:  n/a Fetal Wellbeing:  Category I Pain Control:  Epidural I/D:  GBS neg Anticipated MOD:  NSVD  Sharen CounterLisa Leftwich-Kirby 08/11/2018, 10:32 PM

## 2018-08-11 NOTE — MAU Provider Note (Signed)
See H&P  Katrinka BlazingSmith, [place], [place] 08/11/2018 9:44 AM

## 2018-08-11 NOTE — Progress Notes (Addendum)
Beth Bray is a 22 y.o. G1P0000 at 2722w0d admitted for spontaneous onset of labor (contractions) with NRNST in MAU.  Subjective: Patient resting in bed, complained of returning pain in her back. Resolved with change in position and PCA bolus. Tolerated IUPC insertion well.  Objective: BP 111/65   Pulse (!) 132   Temp 97.9 F (36.6 C) (Oral)   Resp 18   Ht 5\' 3"  (1.6 m)   Wt 75.9 kg   LMP 11/04/2017   SpO2 100%   BMI 29.63 kg/m  No intake/output data recorded. Total I/O In: -  Out: 1200 [Urine:1200]  FHT:  FHR: 150 bpm, variability: minimal ,  accelerations:  Present,  decelerations:  Absent UC:   irregular, every 3-5 minutes SVE:   Dilation: 4 Effacement (%): 90 Station: -2 Exam by:: Beverlyn RouxJamella Walker, SCNM  Labs: Lab Results  Component Value Date   WBC 6.4 08/11/2018   HGB 9.0 (L) 08/11/2018   HCT 29.6 (L) 08/11/2018   MCV 75.7 (L) 08/11/2018   PLT 266 08/11/2018    Assessment / Plan: Spontaneous labor, progressing normally  Labor: Progressing slowly, will consult w MD about starting Pitocin Preeclampsia:  no signs or symptoms of toxicity Fetal Wellbeing:  Category I Pain Control:  Epidural I/D:  n/a Anticipated MOD:  NSVD  Bernerd LimboJamilla R Walker, SNM 08/11/2018, 6:54 PM   I was present for the exam and agree with above.  Katrinka BlazingSmith, [place], CNM 08/11/2018 7:40 PM

## 2018-08-11 NOTE — H&P (Signed)
HPI: Beth Bray is a 22 y.o. year old G9P0000 female at [redacted]w[redacted]d weeks gestation who presents to MAU reporting worsening contractions and leaking of clear fluid at 0730. Fern neg in MAU.    Nursing Staff Provider  Office Location  [facility] Dating   LMP c/w 7 wk u/s  Language   English Anatomy US   Normal   Flu Vaccine   05/22/18 Genetic Screen  NIPS: low risk female  AFP:  Next visit    TDaP vaccine   05/22/18 Hgb A1C or  GTT Early  Third trimester  Glucose, Fasting 65 - 91 mg/dL 82   Glucose, 1 hour 65 - 179 mg/dL 811   Glucose, 2 hour 65 - 152 mg/dL 914     Rhogam   n/a   LAB RESULTS   Feeding Plan  Breast Blood Type O/Positive/-- (05/30 0955)   Contraception undecided Antibody Negative (05/30 0955)O positive  Circumcision  yes Rubella 2.41 (05/30 0955)imm  Pediatrician  Beth Bray  RPR Non Reactive (05/30 0955)   Support Person Beth Bray (FOB), and Beth Bray  HBsAg Negative (05/30 0955) neg  Prenatal Classes  offered HIV Non Reactive (05/30 0955)  BTL Consent  n/a GBS  Negative   VBAC Consent  n/a Pap NA, age     Hgb Electro   normal    CF  negative    SMA  low risk    Waterbirth  [ ]  Class [ ]  Consent [ ]  CNM visit   OB History    Gravida  1   Para  0   Term  0   Preterm  0   AB  0   Living  0     SAB  0   TAB  0   Ectopic  0   Multiple  0   Live Births             Past Medical History:  Diagnosis Date  . Anxiety   . Asthma   . Depression    Past Surgical History:  Procedure Laterality Date  . NO PAST SURGERIES    . WISDOM TOOTH EXTRACTION     Family History: family history includes Heart disease in her mother; Hypertension in her mother; Thyroid disease in her mother. Social History:  reports that she is a non-smoker but has been exposed to tobacco smoke. She has never used smokeless tobacco. She reports that she does not drink alcohol or use drugs.     Maternal Diabetes: No Genetic Screening: Normal Maternal Ultrasounds/Referrals: Normal Fetal  Ultrasounds or other Referrals:  None Maternal Substance Abuse:  Yes:  Type: Marijuana Significant Maternal Medications:  None Significant Maternal Lab Results:  Lab values include: Group B Strep negative Other Comments:  decreased variability  Review of Systems  Constitutional: Negative for chills and fever.  Gastrointestinal: Positive for abdominal pain.   History Dilation: 1 Effacement (%): Thick Station: Ballotable Exam by:: Beth Bray Blood pressure 115/70, pulse 100, temperature 97.7 F (36.5 C), temperature source Oral, resp. rate 18, height 5\' 3"  (1.6 m), weight 75.9 kg, last menstrual period 11/04/2017. Maternal Exam:  Uterine Assessment: Contraction strength is moderate.  Contraction frequency is regular.   Abdomen: Patient reports no abdominal tenderness. Fundal height is 40 cm.   Estimated fetal weight is 8 lb.   Fetal presentation: vertex  Introitus: Normal vulva. Normal vagina.  Ferning test: negative.   Pelvis: adequate for delivery.      Fetal Exam Fetal Monitor Review:  Mode: ultrasound.   Baseline rate: 155.  Variability: minimal (<5 bpm).   Pattern: variable decelerations and accelerations present.    Fetal State Assessment: Category II - tracings are indeterminate.     Physical Exam  Nursing note and vitals reviewed. Constitutional: She is oriented to person, place, and time. She appears well-developed and well-nourished. She appears distressed.  HENT:  Head: Normocephalic.  Eyes: No scleral icterus.  Cardiovascular: Normal rate and regular rhythm.  Respiratory: Effort normal and breath sounds normal. No respiratory distress.  GI: Soft. There is no tenderness.  Genitourinary: Vagina normal.  Musculoskeletal: She exhibits no edema or tenderness.  Neurological: She is alert and oriented to person, place, and time. She has normal reflexes.  Skin: Skin is warm and dry.  Psychiatric: She has a normal mood and affect.    Prenatal  labs: ABO, Rh: O/Positive/-- (05/30 0955) Antibody: Negative (05/30 0955) Rubella: 2.41 (05/30 0955) RPR: Non Reactive (09/11 0832)  HBsAg: Negative (05/30 0955)  HIV: Non Reactive (09/11 0832)  GBS:   negative  Assessment: 1. Labor: Early labor 2. Fetal Wellbeing: Category II but overall reassuring 3. Pain Control: Fentanyl 4. GBS: Neg 5. 40.0 week IUP  Plan:  1. Admit to BS per consult with MD 2. Routine L&D orders 3. Analgesia/anesthesia PRN  4. Augment PRN 5. CTO FHR tracing closely   Dorathy KinsmanVirginia Lajuanda Bray 08/11/2018, 10:18 AM

## 2018-08-11 NOTE — Anesthesia Preprocedure Evaluation (Signed)

## 2018-08-11 NOTE — Anesthesia Procedure Notes (Signed)
Epidural Patient location during procedure: OB  Staffing Anesthesiologist: Foy Vanduyne, MD Performed: anesthesiologist   Preanesthetic Checklist Completed: patient identified, site marked, surgical consent, pre-op evaluation, timeout performed, IV checked, risks and benefits discussed and monitors and equipment checked  Epidural Patient position: sitting Prep: DuraPrep Patient monitoring: heart rate, continuous pulse ox and blood pressure Approach: right paramedian Location: L3-L4 Injection technique: LOR saline  Needle:  Needle type: Tuohy  Needle gauge: 17 G Needle length: 9 cm and 9 Needle insertion depth: 6 cm Catheter type: closed end flexible Catheter size: 20 Guage Catheter at skin depth: 10 cm Test dose: negative  Assessment Events: blood not aspirated, injection not painful, no injection resistance, negative IV test and no paresthesia  Additional Notes Patient identified. Risks/Benefits/Options discussed with patient including but not limited to bleeding, infection, nerve damage, paralysis, failed block, incomplete pain control, headache, blood pressure changes, nausea, vomiting, reactions to medication both or allergic, itching and postpartum back pain. Confirmed with bedside nurse the patient's most recent platelet count. Confirmed with patient that they are not currently taking any anticoagulation, have any bleeding history or any family history of bleeding disorders. Patient expressed understanding and wished to proceed. All questions were answered. Sterile technique was used throughout the entire procedure. Please see nursing notes for vital signs. Test dose was given through epidural needle and negative prior to continuing to dose epidural or start infusion. Warning signs of high block given to the patient including shortness of breath, tingling/numbness in hands, complete motor block, or any concerning symptoms with instructions to call for help. Patient was given  instructions on fall risk and not to get out of bed. All questions and concerns addressed with instructions to call with any issues.     

## 2018-08-11 NOTE — MAU Note (Signed)
LOF since 0830, clear fluid, states she feels it still leaking  Reports ctx, states they are every couple of minutes, 8/10  No bleeding  Has not felt any fetal movement today

## 2018-08-11 NOTE — Progress Notes (Signed)
Beth Bray is a 22 y.o. G1P0000 at 4419w0d.  Subjective: Comfortable w/ epidural. Scant questionable MSF seen on pad prior to epidural.   Objective: BP 117/87   Pulse 95   Temp 97.8 F (36.6 C) (Oral)   Resp 18   Ht 5\' 3"  (1.6 m)   Wt 75.9 kg   LMP 11/04/2017   SpO2 100%   BMI 29.63 kg/m    FHT:  FHR: 150 bpm, variability: min-moderate,  accelerations:  10x10,  decelerations:  Mild variables UC:   Q 1-4 minutes, moderate Dilation: 2 Effacement (%): 50 Cervical Position: Anterior Station: -2 Presentation: Vertex Exam by:: Dorathy KinsmanVirginia Lulia Schriner, CNM AROM small amount of meconium stained fluid.   Labs: Results for orders placed or performed during the hospital encounter of 08/11/18 (from the past 24 hour(s))  CBC     Status: Abnormal   Collection Time: 08/11/18  9:57 AM  Result Value Ref Range   WBC 6.4 4.0 - 10.5 K/uL   RBC 3.91 3.87 - 5.11 MIL/uL   Hemoglobin 9.0 (L) 12.0 - 15.0 g/dL   HCT 16.129.6 (L) 09.636.0 - 04.546.0 %   MCV 75.7 (L) 80.0 - 100.0 fL   MCH 23.0 (L) 26.0 - 34.0 pg   MCHC 30.4 30.0 - 36.0 g/dL   RDW 40.916.9 (H) 81.111.5 - 91.415.5 %   Platelets 266 150 - 400 K/uL   nRBC 0.7 (H) 0.0 - 0.2 %  Type and screen     Status: None   Collection Time: 08/11/18  9:57 AM  Result Value Ref Range   ABO/RH(D) O POS    Antibody Screen NEG    Sample Expiration      08/14/2018 Performed at [facility], [address] [place], [place] [id number]     Assessment / Plan: 1619w0d week IUP Labor: Early Fetal Wellbeing:  Category II, but moderate variability. Overall reassuring.  Pain Control:  Epidural Anticipated MOD:  Uncertain. Will see if patient progresses in labor after AROM. If not, may consider pitocin augmentation if FHR tracing in reassuring. Dr. Macon LargeAnyanwu updated.   Katrinka BlazingSmith, [place], [place] 08/11/2018 6:16 PM

## 2018-08-12 ENCOUNTER — Encounter (HOSPITAL_COMMUNITY): Payer: Self-pay | Admitting: *Deleted

## 2018-08-12 ENCOUNTER — Encounter (HOSPITAL_COMMUNITY)
Admission: AD | Disposition: A | Discharge status: Home / Self Care | Payer: Self-pay | Source: Home / Self Care | Attending: Obstetrics and Gynecology

## 2018-08-12 DIAGNOSIS — Z3A41 41 weeks gestation of pregnancy: Secondary | ICD-10-CM

## 2018-08-12 DIAGNOSIS — Z5189 Encounter for other specified aftercare: Secondary | ICD-10-CM

## 2018-08-12 DIAGNOSIS — O480 Post-term pregnancy: Secondary | ICD-10-CM

## 2018-08-12 HISTORY — DX: Encounter for other specified aftercare: Z51.89

## 2018-08-12 LAB — CBC
HCT: 23.2 % — ABNORMAL LOW (ref 36.0–46.0)
Hemoglobin: 7.2 g/dL — ABNORMAL LOW (ref 12.0–15.0)
MCH: 23.1 pg — ABNORMAL LOW (ref 26.0–34.0)
MCHC: 31 g/dL (ref 30.0–36.0)
MCV: 74.4 fL — ABNORMAL LOW (ref 80.0–100.0)
Platelets: 206 10*3/uL (ref 150–400)
RBC: 3.12 MIL/uL — ABNORMAL LOW (ref 3.87–5.11)
RDW: 16.7 % — ABNORMAL HIGH (ref 11.5–15.5)
WBC: 7.4 10*3/uL (ref 4.0–10.5)
nRBC: 0 % (ref 0.0–0.2)

## 2018-08-12 LAB — CREATININE, SERUM
Creatinine, Ser: 1.11 mg/dL — ABNORMAL HIGH (ref 0.44–1.00)
GFR calc Af Amer: >60 mL/min (ref >60)

## 2018-08-12 SURGERY — Surgical Case
Anesthesia: Epidural | Site: Abdomen | Wound class: Clean Contaminated

## 2018-08-12 MED ORDER — WITCH HAZEL-GLYCERIN EX PADS: 1.0000 "application " | MEDICATED_PAD | CUTANEOUS | Status: DC | PRN

## 2018-08-12 MED ORDER — DIPHENHYDRAMINE HCL 25 MG PO CAPS: 25.0000 mg | ORAL_CAPSULE | Freq: Four times a day (QID) | ORAL | Status: DC | PRN

## 2018-08-12 MED ORDER — KETOROLAC TROMETHAMINE 30 MG/ML IJ SOLN
30.0000 mg | Freq: Four times a day (QID) | INTRAMUSCULAR | Status: AC | PRN
  Administered 2018-08-12: 30 mg via INTRAVENOUS
  Filled 2018-08-12: qty 1

## 2018-08-12 MED ORDER — IBUPROFEN 600 MG PO TABS
600.0000 mg | ORAL_TABLET | Freq: Four times a day (QID) | ORAL | Status: DC
  Administered 2018-08-13 – 2018-08-15 (×10): 600 mg via ORAL
  Filled 2018-08-12 (×10): qty 1

## 2018-08-12 MED ORDER — OXYTOCIN 10 UNIT/ML IJ SOLN
INTRAMUSCULAR | Status: AC
  Filled 2018-08-12: qty 4

## 2018-08-12 MED ORDER — DIBUCAINE 1 % RE OINT: 1.0000 "application " | TOPICAL_OINTMENT | RECTAL | Status: DC | PRN

## 2018-08-12 MED ORDER — SODIUM CHLORIDE 0.9 % IV SOLN
500.0000 mg | INTRAVENOUS | Status: AC
  Administered 2018-08-12: 500 mg via INTRAVENOUS
  Filled 2018-08-12: qty 500

## 2018-08-12 MED ORDER — HYDROMORPHONE HCL 1 MG/ML IJ SOLN
INTRAMUSCULAR | Status: AC
  Filled 2018-08-12: qty 1

## 2018-08-12 MED ORDER — DIPHENHYDRAMINE HCL 25 MG PO CAPS
25.0000 mg | ORAL_CAPSULE | ORAL | Status: DC | PRN
  Administered 2018-08-13 (×2): 25 mg via ORAL
  Filled 2018-08-12 (×3): qty 1

## 2018-08-12 MED ORDER — MIDAZOLAM HCL 2 MG/2ML IJ SOLN
INTRAMUSCULAR | Status: AC
  Filled 2018-08-12: qty 2

## 2018-08-12 MED ORDER — SODIUM CHLORIDE 0.9% FLUSH: 3.0000 mL | INTRAVENOUS | Status: DC | PRN

## 2018-08-12 MED ORDER — PRENATAL MULTIVITAMIN CH
1.0000 | ORAL_TABLET | Freq: Every day | ORAL | Status: DC
  Administered 2018-08-13 – 2018-08-14 (×2): 1 via ORAL
  Filled 2018-08-12 (×2): qty 1

## 2018-08-12 MED ORDER — FENTANYL CITRATE (PF) 100 MCG/2ML IJ SOLN
INTRAMUSCULAR | Status: DC | PRN
  Administered 2018-08-12 (×2): 100 ug via EPIDURAL

## 2018-08-12 MED ORDER — FENTANYL CITRATE (PF) 100 MCG/2ML IJ SOLN
INTRAMUSCULAR | Status: AC
  Filled 2018-08-12: qty 2

## 2018-08-12 MED ORDER — NALOXONE HCL 0.4 MG/ML IJ SOLN: 0.4000 mg | INTRAMUSCULAR | Status: DC | PRN

## 2018-08-12 MED ORDER — BUPIVACAINE HCL (PF) 0.5 % IJ SOLN
INTRAMUSCULAR | Status: DC | PRN
  Administered 2018-08-12: 3 mL
  Administered 2018-08-12: 1 mL
  Administered 2018-08-12: 4 mL

## 2018-08-12 MED ORDER — CEFAZOLIN SODIUM-DEXTROSE 2-3 GM-%(50ML) IV SOLR
INTRAVENOUS | Status: DC | PRN
  Administered 2018-08-12: 2 g via INTRAVENOUS

## 2018-08-12 MED ORDER — OXYCODONE HCL 5 MG PO TABS
5.0000 mg | ORAL_TABLET | ORAL | Status: DC | PRN
  Filled 2018-08-12 (×2): qty 1

## 2018-08-12 MED ORDER — HYDROMORPHONE HCL 1 MG/ML IJ SOLN
INTRAMUSCULAR | Status: DC | PRN
  Administered 2018-08-12: 2 mg via INTRAVENOUS

## 2018-08-12 MED ORDER — TETANUS-DIPHTH-ACELL PERTUSSIS 5-2.5-18.5 LF-MCG/0.5 IM SUSP: 0.5000 mL | Freq: Once | INTRAMUSCULAR | Status: DC

## 2018-08-12 MED ORDER — ONDANSETRON HCL 4 MG/2ML IJ SOLN: 4.0000 mg | Freq: Three times a day (TID) | INTRAMUSCULAR | Status: DC | PRN

## 2018-08-12 MED ORDER — SCOPOLAMINE 1 MG/3DAYS TD PT72: 1.0000 | MEDICATED_PATCH | Freq: Once | TRANSDERMAL | Status: DC

## 2018-08-12 MED ORDER — ACETAMINOPHEN 325 MG PO TABS
650.0000 mg | ORAL_TABLET | ORAL | Status: DC | PRN
  Administered 2018-08-13 – 2018-08-15 (×4): 650 mg via ORAL
  Filled 2018-08-12 (×4): qty 2

## 2018-08-12 MED ORDER — PHENYLEPHRINE HCL 10 MG/ML IJ SOLN
INTRAMUSCULAR | Status: DC | PRN
  Administered 2018-08-12 (×2): 60 ug via INTRAVENOUS
  Administered 2018-08-12 (×2): 40 ug via INTRAVENOUS

## 2018-08-12 MED ORDER — COCONUT OIL OIL: 1.0000 "application " | TOPICAL_OIL | Status: DC | PRN

## 2018-08-12 MED ORDER — SENNOSIDES-DOCUSATE SODIUM 8.6-50 MG PO TABS
2.0000 | ORAL_TABLET | ORAL | Status: DC
  Administered 2018-08-13 – 2018-08-14 (×3): 2 via ORAL
  Filled 2018-08-12 (×3): qty 2

## 2018-08-12 MED ORDER — OXYTOCIN 40 UNITS IN LACTATED RINGERS INFUSION - SIMPLE MED: 2.5000 [IU]/h | INTRAVENOUS | Status: AC

## 2018-08-12 MED ORDER — KETOROLAC TROMETHAMINE 30 MG/ML IJ SOLN: 30.0000 mg | Freq: Four times a day (QID) | INTRAMUSCULAR | Status: AC | PRN

## 2018-08-12 MED ORDER — FENTANYL CITRATE (PF) 100 MCG/2ML IJ SOLN
100.0000 ug | Freq: Once | INTRAMUSCULAR | Status: AC
  Administered 2018-08-12: 100 ug via EPIDURAL

## 2018-08-12 MED ORDER — LACTATED RINGERS IV SOLN
INTRAVENOUS | Status: DC | PRN
  Administered 2018-08-12: 12:00:00 via INTRAVENOUS

## 2018-08-12 MED ORDER — ENOXAPARIN SODIUM 40 MG/0.4ML ~~LOC~~ SOLN
40.0000 mg | SUBCUTANEOUS | Status: DC
  Administered 2018-08-13 – 2018-08-15 (×3): 40 mg via SUBCUTANEOUS
  Filled 2018-08-12 (×3): qty 0.4

## 2018-08-12 MED ORDER — MIDAZOLAM HCL 2 MG/2ML IJ SOLN
INTRAMUSCULAR | Status: DC | PRN
  Administered 2018-08-12: 2 mg via INTRAVENOUS

## 2018-08-12 MED ORDER — HYDROMORPHONE HCL 1 MG/ML IJ SOLN
0.2500 mg | INTRAMUSCULAR | Status: DC | PRN
  Administered 2018-08-12: 0.25 mg via INTRAVENOUS

## 2018-08-12 MED ORDER — OXYTOCIN 10 UNIT/ML IJ SOLN
INTRAVENOUS | Status: DC | PRN
  Administered 2018-08-12: 40 [IU] via INTRAVENOUS

## 2018-08-12 MED ORDER — MORPHINE SULFATE (PF) 0.5 MG/ML IJ SOLN
INTRAMUSCULAR | Status: DC | PRN
  Administered 2018-08-12: 3 mg via EPIDURAL

## 2018-08-12 MED ORDER — OXYTOCIN 40 UNITS IN LACTATED RINGERS INFUSION - SIMPLE MED: 1.0000 m[IU]/min | INTRAVENOUS | Status: DC

## 2018-08-12 MED ORDER — NALBUPHINE HCL 10 MG/ML IJ SOLN: 5.0000 mg | INTRAMUSCULAR | Status: DC | PRN

## 2018-08-12 MED ORDER — LACTATED RINGERS IV SOLN
INTRAVENOUS | Status: DC
  Administered 2018-08-12 – 2018-08-13 (×2): via INTRAVENOUS

## 2018-08-12 MED ORDER — CEFAZOLIN SODIUM-DEXTROSE 2-4 GM/100ML-% IV SOLN
2.0000 g | Freq: Three times a day (TID) | INTRAVENOUS | Status: DC
  Filled 2018-08-12: qty 100

## 2018-08-12 MED ORDER — SIMETHICONE 80 MG PO CHEW
80.0000 mg | CHEWABLE_TABLET | ORAL | Status: DC
  Administered 2018-08-13 – 2018-08-14 (×3): 80 mg via ORAL
  Filled 2018-08-12 (×3): qty 1

## 2018-08-12 MED ORDER — ZOLPIDEM TARTRATE 5 MG PO TABS: 5.0000 mg | ORAL_TABLET | Freq: Every evening | ORAL | Status: DC | PRN

## 2018-08-12 MED ORDER — HYDROCODONE-ACETAMINOPHEN 7.5-325 MG PO TABS: 1.0000 | ORAL_TABLET | Freq: Once | ORAL | Status: DC | PRN

## 2018-08-12 MED ORDER — MORPHINE SULFATE (PF) 0.5 MG/ML IJ SOLN
INTRAMUSCULAR | Status: AC
  Filled 2018-08-12: qty 10

## 2018-08-12 MED ORDER — NALBUPHINE HCL 10 MG/ML IJ SOLN: 5.0000 mg | Freq: Once | INTRAMUSCULAR | Status: DC | PRN

## 2018-08-12 MED ORDER — SIMETHICONE 80 MG PO CHEW
80.0000 mg | CHEWABLE_TABLET | Freq: Three times a day (TID) | ORAL | Status: DC
  Administered 2018-08-13 – 2018-08-15 (×7): 80 mg via ORAL
  Filled 2018-08-12 (×7): qty 1

## 2018-08-12 MED ORDER — PROMETHAZINE HCL 25 MG/ML IJ SOLN: 6.2500 mg | INTRAMUSCULAR | Status: DC | PRN

## 2018-08-12 MED ORDER — OXYCODONE HCL 5 MG PO TABS
10.0000 mg | ORAL_TABLET | ORAL | Status: DC | PRN
  Administered 2018-08-13 – 2018-08-15 (×10): 10 mg via ORAL
  Filled 2018-08-12 (×10): qty 2

## 2018-08-12 MED ORDER — SIMETHICONE 80 MG PO CHEW
80.0000 mg | CHEWABLE_TABLET | ORAL | Status: DC | PRN
  Administered 2018-08-13: 80 mg via ORAL
  Filled 2018-08-12: qty 1

## 2018-08-12 MED ORDER — MEPERIDINE HCL 25 MG/ML IJ SOLN: 6.2500 mg | INTRAMUSCULAR | Status: DC | PRN

## 2018-08-12 MED ORDER — LIDOCAINE-EPINEPHRINE (PF) 2 %-1:200000 IJ SOLN
INTRAMUSCULAR | Status: AC
  Filled 2018-08-12: qty 20

## 2018-08-12 MED ORDER — ACETAMINOPHEN 10 MG/ML IV SOLN
INTRAVENOUS | Status: AC
  Filled 2018-08-12: qty 100

## 2018-08-12 MED ORDER — ONDANSETRON HCL 4 MG/2ML IJ SOLN
INTRAMUSCULAR | Status: AC
  Filled 2018-08-12: qty 2

## 2018-08-12 MED ORDER — DIPHENHYDRAMINE HCL 50 MG/ML IJ SOLN: 12.5000 mg | INTRAMUSCULAR | Status: DC | PRN

## 2018-08-12 MED ORDER — STERILE WATER FOR IRRIGATION IR SOLN
Status: DC | PRN
  Administered 2018-08-12: 1000 mL

## 2018-08-12 MED ORDER — ACETAMINOPHEN 10 MG/ML IV SOLN
1000.0000 mg | Freq: Once | INTRAVENOUS | Status: DC | PRN
  Administered 2018-08-12: 1000 mg via INTRAVENOUS

## 2018-08-12 MED ORDER — NALOXONE HCL 4 MG/10ML IJ SOLN: 1.0000 ug/kg/h | INTRAVENOUS | Status: DC | PRN

## 2018-08-12 MED ORDER — SODIUM BICARBONATE 8.4 % IV SOLN
INTRAVENOUS | Status: AC
  Filled 2018-08-12: qty 50

## 2018-08-12 MED ORDER — SOD CITRATE-CITRIC ACID 500-334 MG/5ML PO SOLN: 30.0000 mL | ORAL | Status: DC

## 2018-08-12 MED ORDER — SODIUM CHLORIDE 0.9 % IR SOLN
Status: DC | PRN
  Administered 2018-08-12: 1000 mL

## 2018-08-12 MED ORDER — MENTHOL 3 MG MT LOZG: 1.0000 | LOZENGE | OROMUCOSAL | Status: DC | PRN

## 2018-08-12 SURGICAL SUPPLY — 37 items
APL SKNCLS STERI-STRIP NONHPOA (GAUZE/BANDAGES/DRESSINGS) ×1
BENZOIN TINCTURE PRP APPL 2/3 (GAUZE/BANDAGES/DRESSINGS) ×3 IMPLANT
CANISTER SUCT 3000ML PPV (MISCELLANEOUS) ×3 IMPLANT
CHLORAPREP W/TINT 26ML (MISCELLANEOUS) ×3 IMPLANT
CLOSURE WOUND 1/2 X4 (GAUZE/BANDAGES/DRESSINGS) ×1
DRSG OPSITE POSTOP 4X10 (GAUZE/BANDAGES/DRESSINGS) ×3 IMPLANT
ELECT REM PT RETURN 9FT ADLT (ELECTROSURGICAL) ×3
ELECTRODE REM PT RTRN 9FT ADLT (ELECTROSURGICAL) ×1 IMPLANT
EXTRACTOR VACUUM KIWI (MISCELLANEOUS) ×3 IMPLANT
GAUZE SPONGE 4X4 12PLY STRL LF (GAUZE/BANDAGES/DRESSINGS) ×6 IMPLANT
GLOVE BIOGEL PI IND STRL 7.0 (GLOVE) ×2 IMPLANT
GLOVE BIOGEL PI IND STRL 7.5 (GLOVE) ×1 IMPLANT
GLOVE BIOGEL PI INDICATOR 7.0 (GLOVE) ×4
GLOVE BIOGEL PI INDICATOR 7.5 (GLOVE) ×2
GLOVE SKINSENSE NS SZ7.0 (GLOVE) ×2
GLOVE SKINSENSE STRL SZ7.0 (GLOVE) ×1 IMPLANT
GOWN STRL REUS W/ TWL LRG LVL3 (GOWN DISPOSABLE) ×2 IMPLANT
GOWN STRL REUS W/ TWL XL LVL3 (GOWN DISPOSABLE) ×1 IMPLANT
GOWN STRL REUS W/TWL LRG LVL3 (GOWN DISPOSABLE) ×6
GOWN STRL REUS W/TWL XL LVL3 (GOWN DISPOSABLE) ×3
NS IRRIG 1000ML POUR BTL (IV SOLUTION) ×3 IMPLANT
PACK C SECTION WH (CUSTOM PROCEDURE TRAY) ×3 IMPLANT
PAD ABD 7.5X8 STRL (GAUZE/BANDAGES/DRESSINGS) ×3 IMPLANT
PAD OB MATERNITY 4.3X12.25 (PERSONAL CARE ITEMS) ×3 IMPLANT
PAD PREP 24X48 CUFFED NSTRL (MISCELLANEOUS) ×3 IMPLANT
PENCIL SMOKE EVAC W/HOLSTER (ELECTROSURGICAL) ×3 IMPLANT
SPONGE LAP 18X18 RF (DISPOSABLE) ×12 IMPLANT
STRIP CLOSURE SKIN 1/2X4 (GAUZE/BANDAGES/DRESSINGS) ×2 IMPLANT
SUT MNCRL 0 VIOLET CTX 36 (SUTURE) ×4 IMPLANT
SUT MON AB 4-0 PS1 27 (SUTURE) ×6 IMPLANT
SUT MONOCRYL 0 CTX 36 (SUTURE) ×8
SUT PLAIN 2 0 XLH (SUTURE) ×3 IMPLANT
SUT VIC AB 0 CT1 36 (SUTURE) ×6 IMPLANT
SUT VIC AB 3-0 CT1 27 (SUTURE) ×3
SUT VIC AB 3-0 CT1 TAPERPNT 27 (SUTURE) ×1 IMPLANT
TAPE CLOTH SURG 4X10 WHT LF (GAUZE/BANDAGES/DRESSINGS) ×3 IMPLANT
TOWEL OR 17X24 6PK STRL BLUE (TOWEL DISPOSABLE) ×6 IMPLANT

## 2018-08-12 NOTE — Anesthesia Pain Management Evaluation Note (Signed)
  [facility] Pain Management Visit Note  Patient: Beth Bray, 22 y.o., female  "Hello I am a member of the anesthesia team at [facility]. We have an anesthesia team available at all times to provide care throughout the hospital, including epidural management and anesthesia for C-section. I don't know your plan for the delivery whether it a natural birth, water birth, IV sedation, nitrous supplementation, doula or epidural, but we want to meet your pain goals."   1.Was your pain managed to your expectations on prior hospitalizations?   No prior hospitalizations  2.What is your expectation for pain management during this hospitalization?     Epidural  3.How can we help you reach that goal? unsure  Record the patient's initial score and the patient's pain goal.   Pain: 10  Pain Goal: 10 [facility] wants you to be able to say your pain was always managed very well.  Cephus ShellingBURGER,Lareen Mullings 08/12/2018

## 2018-08-12 NOTE — Transfer of Care (Signed)
Immediate Anesthesia Transfer of Care Note  Patient: Beth Bray  Procedure(s) Performed: CESAREAN SECTION (N/A Abdomen)  Patient Location: PACU  Anesthesia Type:Epidural  Level of Consciousness: awake, alert , oriented and sedated  Airway & Oxygen Therapy: Patient Spontanous Breathing and Patient connected to face mask oxygen  Post-op Assessment: Report given to RN and Post -op Vital signs reviewed and stable  Post vital signs: Reviewed and stable  Last Vitals:  Vitals Value Taken Time  BP 126/55 08/12/2018  1:30 PM  Temp    Pulse 138 08/12/2018  1:36 PM  Resp 20 08/12/2018  1:36 PM  SpO2 94 % 08/12/2018  1:36 PM  Vitals shown include unvalidated device data.  Last Pain:  Vitals:   08/12/18 1114  TempSrc:   PainSc: 8          Complications: No apparent anesthesia complications

## 2018-08-12 NOTE — Progress Notes (Signed)
Epidural pump alarming occluded during PCE bolus.  Pt very uncomfortable.  Dr Richardson LandryHouser notified of discomfort and epidural occlusion.

## 2018-08-12 NOTE — Progress Notes (Signed)
Beth Bray is a 22 y.o. G1P0000 at 4151w1d by 7 week ultrasound admitted for early labor, nonreactive NST  Subjective: Pt reports her back pain was gone after epidural dose but has now returned. She is crying with her pain.    Objective: BP 125/89   Pulse (!) 114   Temp 98.3 F (36.8 C) (Oral)   Resp 17   Ht 5\' 3"  (1.6 m)   Wt 75.9 kg   LMP 11/04/2017   SpO2 100%   BMI 29.63 kg/m  I/O last 3 completed shifts: In: -  Out: 1200 [Urine:1200] Total I/O In: -  Out: 1450 [Urine:1450]  FHT:  FHR: 150 bpm, variability: moderate,  accelerations:  Abscent,  decelerations:  Absent UC:   irregular, every 1-3 minutes, with resting tone >30 between some contractions SVE:   Dilation: 6.5 Effacement (%): 90 Station: -2 Exam by:: Sharen CounterLisa Leftwich-kirby, CNM  Labs: Lab Results  Component Value Date   WBC 6.4 08/11/2018   HGB 9.0 (L) 08/11/2018   HCT 29.6 (L) 08/11/2018   MCV 75.7 (L) 08/11/2018   PLT 266 08/11/2018    Assessment / Plan: Augmentation of labor, progressing well  Labor: Called anesthesia to give additional epidural dose. Pitocin down from 6 miliunts to 2 milliuits per min.  IUPC flushed and zeroed for improved tracing.  Will continue to position pt for improved fetal position/descent.  Preeclampsia:  n/a Fetal Wellbeing:  Category I Pain Control:  Epidural I/D:  GBS neg Anticipated MOD:  NSVD  Sharen CounterLisa Leftwich-Kirby 08/12/2018, 2:15 AM

## 2018-08-12 NOTE — Progress Notes (Signed)
L&D Note Patient more uncomfortable.  AFVS normal and stable Category I no accels q1-3 UCs (IUPC not working and tracing externally now) 6/70/high +caput  D/w pt arrest of dilation (has been 6cm since 0130 today and on pitocin) and recommend c/s which she is amenable to  Will do ancef and azithro. Can proceed when OR is ready  Beth Bray, Jr MD Attending [facility] 08/12/2018 Time: 1134am

## 2018-08-12 NOTE — Lactation Note (Addendum)
This note was copied from a baby's chart. Lactation Consultation Note  Patient Name: Beth Bray ZOXWR'UToday's Date: 08/12/2018 Reason for consult: Initial assessment;1st time breastfeeding;Term;Difficult latch P1, 11 hour female infant. BF concerns: Infant not latching to breast, c/s delivery ,  LGA greater than 9 lbs; and mom's right breast is inverted and left breast  is flat.. Per mom, infant has not latched to breast  Only had a few attempts. Per  Mom, infant had 2 voids (meconium) since delivery. Mom attempted to latch infant on left breast using football hold infant holds breast in mouth but is not suckling at this time nor will sustain latch. Mom hand expressed and infant was given 6 ml of colostrum by spoon.  LC had infant suckle on gloved finger infant take few suckles then stop.  LC discussed with mom to do nipple stimulation, pre-pumping and hand expression prior to latching infant to breast. LC discussed I & O. Mom shown how to use DEBP & how to disassemble, clean, & reassemble parts. Mom made aware of O/P services, breastfeeding support groups, community resources, and our phone # for post-discharge questions.  BF short term  plans: 1. Mom will BF infant according hunger cues, 8 to 12 times within 24 hours including nights and mom will not exceed 3 hours without breast feeding infant. 2. If infant is reluctant to breast  feed, Mom will hand express and give by EBM 5 mls or greater per feeding by spoon for an  infant less than 24 hours old. 3. Mom will do as much STS as possible. 4. Mom will use DEBP every 3 hours for 15 minutes.  Maternal Data Formula Feeding for Exclusion: No Has patient been taught Hand Expression?: Yes(Mom expressed 6 ml of colostrum that was spoon feed to infant.) Does the patient have breastfeeding experience prior to this delivery?: No  Feeding Feeding Type: Breast Fed  LATCH Score Latch: Repeated attempts needed to sustain latch, nipple held in mouth  throughout feeding, stimulation needed to elicit sucking reflex.  Audible Swallowing: None  Type of Nipple: Inverted  Comfort (Breast/Nipple): Soft / non-tender  Hold (Positioning): Assistance needed to correctly position infant at breast and maintain latch.  LATCH Score: 4  Interventions Interventions: Breast feeding basics reviewed;Assisted with latch;Skin to skin;Breast massage;Hand express;Position options;Support pillows;Adjust position;Breast compression;Shells  Lactation Tools Discussed/Used Tools: Shells Shell Type: Inverted(right breast inverted and left breast is flat.) WIC Program: Yes Pump Review: Setup, frequency, and cleaning;Milk Storage;Other (comment) Initiated by:: Danelle Earthlyobin Aliena Ghrist, IBCLC Date initiated:: 08/12/18   Consult Status Consult Status: Follow-up Date: 08/12/18 Follow-up type: In-patient    Danelle EarthlyRobin Lauralei Clouse 08/12/2018, 11:34 PM

## 2018-08-12 NOTE — Progress Notes (Signed)
Beth Bray is a 22 y.o. G1P0000 at 7156w1d admitted for early labor and nonreactive NST.   Subjective: Pt crying, is again uncomfortable with intermittent back pain, low abdominal pain, and pelvic pain. Pt mother is upset, wants to know how we can make her daughter more comfortable or deliver the baby by C/S since she is in so much pain.  Objective: BP (!) 126/54   Pulse (!) 129   Temp 97.8 F (36.6 C) (Oral)   Resp 19   Ht 5\' 3"  (1.6 m)   Wt 75.9 kg   LMP 11/04/2017   SpO2 98%   BMI 29.63 kg/m  I/O last 3 completed shifts: In: -  Out: 3225 [Urine:3225] No intake/output data recorded.  FHT:  FHR: 150 bpm, variability: moderate,  accelerations:  Abscent,  decelerations:  Absent UC:   irregular, every 1-4 minutes.  MVUs 125. SVE:   Dilation: 6 Effacement (%): 90 Station: -2 Exam by:: Sharen CounterLisa Leftwich-kirby, CNM  Labs: Lab Results  Component Value Date   WBC 6.4 08/11/2018   HGB 9.0 (L) 08/11/2018   HCT 29.6 (L) 08/11/2018   MCV 75.7 (L) 08/11/2018   PLT 266 08/11/2018    Assessment / Plan: Protracted active phase  Labor: Pitocin on, inadequate contractions, no cervical change in 5+ hours.  Likely fetal malposition, no longer transverse head position, more ROA, but no change in station in many hours.  Difficult to get adequate contractions because resting tone increases with tachysystole.  Pitocin on 2 milliunits/min currently. Continue to increase until adequate.   Preeclampsia:  n/a Fetal Wellbeing:  Category I Pain Control:  Epidural I/D:  GBS neg Anticipated MOD:  NSVD  Sharen CounterLisa Leftwich-Kirby 08/12/2018, 7:02 AM

## 2018-08-12 NOTE — Progress Notes (Signed)
Beth Bray is a 22 y.o. G1P0000 at 6638w1d admitted for early labor, nonreactive NST  Subjective: Pt feeling pelvic pressure and some rectal pressure with contractions.    Objective: BP 118/73   Pulse 96   Temp 98.4 F (36.9 C) (Oral)   Resp 19   Ht 5\' 3"  (1.6 m)   Wt 75.9 kg   LMP 11/04/2017   SpO2 99%   BMI 29.63 kg/m  I/O last 3 completed shifts: In: -  Out: 1200 [Urine:1200] Total I/O In: -  Out: 1450 [Urine:1450]  FHT:  FHR: 150 bpm, variability: moderate,  accelerations:  Abscent,  decelerations:  Absent UC:   irregular, every 1-4 minutes SVE:   Dilation: 8 Effacement (%): 90 Station: -2 Exam by:: Suezanne JacquetJ Williams, RN  Labs: Lab Results  Component Value Date   WBC 6.4 08/11/2018   HGB 9.0 (L) 08/11/2018   HCT 29.6 (L) 08/11/2018   MCV 75.7 (L) 08/11/2018   PLT 266 08/11/2018    Assessment / Plan: Augmentation of labor, progressing well  Labor: Progressing normally and Pain well managed after last epidural dose Preeclampsia:  n/a Fetal Wellbeing:  Category I Pain Control:  Epidural I/D:  GBS neg Anticipated MOD:  NSVD  Sharen CounterLisa Leftwich-Kirby 08/12/2018, 5:12 AM

## 2018-08-12 NOTE — Anesthesia Postprocedure Evaluation (Signed)
Anesthesia Post Note  Patient: Camera operatorChante Bray  Procedure(s) Performed: CESAREAN SECTION (N/A Abdomen)     Patient location during evaluation: Mother Baby Anesthesia Type: Epidural Level of consciousness: awake and alert Pain management: pain level controlled Vital Signs Assessment: post-procedure vital signs reviewed and stable Respiratory status: spontaneous breathing, nonlabored ventilation, respiratory function stable and patient connected to face mask oxygen Cardiovascular status: stable Postop Assessment: no headache, no backache and epidural receding Anesthetic complications: no    Last Vitals:  Vitals:   08/12/18 1430 08/12/18 1452  BP: 122/70 127/66  Pulse: (!) 138 (!) 125  Resp: 18 18  Temp:  37.1 C  SpO2:      Last Pain:  Vitals:   08/12/18 1452  TempSrc: Axillary  PainSc:    Pain Goal:                 Trevor IhaStephen A Laiken Sandy

## 2018-08-13 LAB — CBC
HCT: 19.6 % — ABNORMAL LOW (ref 36.0–46.0)
Hemoglobin: 6.3 g/dL — CL (ref 12.0–15.0)
MCH: 23.6 pg — ABNORMAL LOW (ref 26.0–34.0)
MCHC: 32.1 g/dL (ref 30.0–36.0)
MCV: 73.4 fL — ABNORMAL LOW (ref 80.0–100.0)
Platelets: 186 10*3/uL (ref 150–400)
RBC: 2.67 MIL/uL — ABNORMAL LOW (ref 3.87–5.11)
RDW: 16.7 % — ABNORMAL HIGH (ref 11.5–15.5)
WBC: 11.5 10*3/uL — ABNORMAL HIGH (ref 4.0–10.5)
nRBC: 0 % (ref 0.0–0.2)

## 2018-08-13 LAB — RPR: RPR Ser Ql: NONREACTIVE

## 2018-08-13 LAB — PREPARE RBC (CROSSMATCH)

## 2018-08-13 MED ORDER — FAMOTIDINE 20 MG PO TABS
20.0000 mg | ORAL_TABLET | Freq: Every day | ORAL | Status: DC
  Administered 2018-08-13 – 2018-08-14 (×2): 20 mg via ORAL
  Filled 2018-08-13 (×2): qty 1

## 2018-08-13 MED ORDER — ACETAMINOPHEN 325 MG PO TABS
650.0000 mg | ORAL_TABLET | Freq: Once | ORAL | Status: AC
  Administered 2018-08-13: 650 mg via ORAL
  Filled 2018-08-13: qty 2

## 2018-08-13 MED ORDER — DIPHENHYDRAMINE HCL 50 MG/ML IJ SOLN
25.0000 mg | Freq: Once | INTRAMUSCULAR | Status: AC
  Administered 2018-08-13: 25 mg via INTRAVENOUS
  Filled 2018-08-13: qty 1

## 2018-08-13 MED ORDER — SODIUM CHLORIDE 0.9% IV SOLUTION: Freq: Once | INTRAVENOUS | Status: DC

## 2018-08-13 NOTE — Lactation Note (Signed)
This note was copied from a baby's chart. Lactation Consultation Note  Patient Name: [name] ZOXWR'UToday's Date: 08/13/2018    Attempted to visit with mom to check on BF, mom reports she would like to wash up right now. Enc mom to call for assistance as needed.    Maternal Data    Feeding    LATCH Score                   Interventions    Lactation Tools Discussed/Used     Consult Status      Ed BlalockSharon S Hice 08/13/2018, 4:19 PM

## 2018-08-13 NOTE — Op Note (Addendum)
Operative Note   SURGERY DATE: 08/13/2018  PRE-OP DIAGNOSIS:  *Pregnancy at 5064w1d *Arrest of dilation at 5-6cm  POST-OP DIAGNOSIS: Same   PROCEDURE: primary low transverse cesarean section via pfannenstiel skin incision with double layer uterine closure  SURGEON: Surgeon(s) and Role:    * Watts BingPickens, Yeraldin Litzenberger, MD - Primary  ASSISTANT:    Gwenevere Abbot* Phillip, Nimeka, MD - Fellow  ANESTHESIA: epidural  ESTIMATED BLOOD LOSS: 704mL  DRAINS: 300mL UOP via indwelling foley  TOTAL IV FLUIDS: 1750mL crystalloid  VTE PROPHYLAXIS: SCDs to bilateral lower extremities  ANTIBIOTICS: 2g ancef given within one hour of incision, 500mg  azithromycin given intraoperatively  SPECIMENS: placenta to pathology Arterial blood gas: 7.177 Venous blood gas: 7.319    COMPLICATIONS: difficult delivery of fetal head through hysterotomy and skin necessitating use of kiwi vacuum  FINDINGS: No intra-abdominal adhesions were noted. Grossly normal uterus, tubes and ovaries. Light meconium amniotic fluid, cephalic female infant, weight 4240gm, APGARs 1/2/7, intact placenta.  PROCEDURE IN DETAIL: The patient was taken to the operating room where anesthesia was administered and normal fetal heart tones were confirmed. She was then prepped and draped in the normal fashion in the dorsal supine position with a leftward tilt.  After a time out was performed, a pfannensteil skin incision was made with the scalpel and carried through to the dermis. The incision was carried through to the underlying layer of fascia using the Bovie. The fascia was then incised at the midline using the scalpel and this incision was extended laterally with the mayo scissors. Attention was turned to the superior aspect of the fascial incision which was grasped with the kocher clamps x 2, tented up and the rectus muscles were dissected off with the Bovie. In a similar fashion the inferior aspect of the fascial incision was grasped with the kocher clamps, tented  up and the rectus muscles dissected off with the mayo scissors. The rectus muscles were then separated in the midline and the peritoneum was entered bluntly. The bladder blade was inserted and the vesicouterine peritoneum was identified, tented up and entered with the metzenbaum scissors. This incision was extended laterally and the bladder flap was created digitally. The bladder blade was reinserted.  The Alexis retractor was then inserted.  A low transverse hysterotomy was made with the scalpel until the endometrial cavity was breached and the amniotic sac ruptured bluntly, yielding light meconium amniotic fluid. This incision was extended bluntly and the infant's head was partially delivered.  A kiwi vacuum devise was used to assist with delivery of the head with 1 pop off.  The incision was then extended bilaterally with the bandage scissors and the infant's head was delivered with shoulders and body following atraumatically.The cord was clamped x 2 and cut, and the infant was handed to the awaiting pediatricians.  The placenta was then gradually expressed from the uterus and then the uterus was exteriorized and cleared of all clots and debris. The hysterotomy was repaired with a running suture of 1-0 monocryl. A second imbricating layer of 1-0 monocryl suture was then placed. Several figure-of-eight sutures of 1-0 monocryl were added to achieve excellent hemostasis.   The uterus and adnexa were then returned to the abdomen, and the hysterotomy and all operative sites were reinspected and excellent hemostasis was noted after irrigation and suction of the abdomen with warm saline.  The fascia was reapproximated with 0 Vicryl in a simple running fashion. The subcutaneous layer was then reapproximated with interrupted sutures of 2-0 plain gut, and  the skin was then closed with 4-0 monocryl, in a subcuticular fashion.  The patient  tolerated the procedure well. Sponge, lap, needle, and instrument counts were  correct x 2. The patient was transferred to the recovery room awake, alert and breathing independently in stable condition.  Gwenevere Abbot, MD Ob Fellow  Center for [facility])   Agree with above. I was present and scrubbed for the entire procedure.   Cornelia Copa MD Attending Center for [employer] [facility])

## 2018-08-13 NOTE — Progress Notes (Signed)
Called to bedside for chest pain. Patient states that pain is in the center of her chest, substernal, pressure like and worse with deep breaths.  She has not been out of bed or done anything strenuous, so it is unclear if she has accompanying shortness of breath. She reports a history of asthma, but is not currently short of breath at rest or wheezing. On exam she is nontoxic appearing and speaks in full sentences.  Her lungs are clear to ausculation bilaterally and breathing appears nonlabored. Her heart is RRR with no murmurs.  We discussed these symptoms with her nurse and the patient. Will monitor at this point since patient doesn't seem to have any worrisome vitals or exam findings.   Gwenevere AbbotNimeka Kevyn Boquet, MD 4:16 AM 08/13/2018

## 2018-08-13 NOTE — Progress Notes (Signed)
Notified MD that the patient is complaining of chest pain via phone call. MD to come and see patient.

## 2018-08-13 NOTE — Progress Notes (Addendum)
POSTPARTUM PROGRESS NOTE  POD #1  Subjective:  Beth Bray is a 22 y.o. G1P1001 s/p LTCS at 3387w1d.  Beth Bray Beth is doing well. Beth developed substernal, non-radiating chest pain last night which is brought on by deep inspiration. Beth denies shortness of breath, wheezing, dizziness, lightheadedness. Beth denies any problems with ambulating, voiding or po intake. Denies nausea or vomiting. Beth has not yet passed flatus. Pain is well controlled.  Lochia is mild.  Objective: Blood pressure (!) 109/59, pulse (!) 111, temperature 98.7 F (37.1 C), temperature source Oral, resp. rate 20, height 5\' 3"  (1.6 m), weight 75.9 kg, last menstrual period 11/04/2017, SpO2 94 %, unknown if currently breastfeeding.  Physical Exam:  General: alert, cooperative and no distress Chest: no respiratory distress Heart:regular rate, distal pulses intact Abdomen: soft, nontender,  Uterine Fundus: firm, appropriately tender DVT Evaluation: No calf swelling or tenderness Extremities: No edema Skin: warm, dry; incision clean/dry/intact w/dressing in place  Recent Labs    08/12/18 1652 08/13/18 0527  HGB 7.2* 6.3*  HCT 23.2* 19.6*    Assessment/Plan: Beth Bray is a 22 y.o. G1P1001 s/p LTCS at 7187w1d.  POD#1 - Doing welll; pain well controlled.  - Routine postpartum care - OOB, ambulated - Lovenox for VTE prophylaxis - Anemia: Hgb 9.0>7.2>6.3. Pt complaining of substernal chest pain. Will transfuse with 3 units of pRBCs. Continue PO iron.    Contraception: Undecided. Feeding: Breast Circumcision: Outpatient  Dispo: Plan for discharge tomorrow.   LOS: 2 days   Cecelia ByarsErin Elphick, PA-S 08/13/2018, 7:54 AM   OB FELLOW POSTPARTUM PROGRESS NOTE ATTESTATION  I have seen and examined this patient and agree with above documentation in the student's note.   Gwenevere AbbotNimeka Meshell Abdulaziz, MD OB Fellow  08/13/2018, 9:22 AM

## 2018-08-13 NOTE — Anesthesia Postprocedure Evaluation (Signed)
Anesthesia Post Note  Patient: Camera operatorChante Bray  Procedure(s) Performed: CESAREAN SECTION (N/A Abdomen)     Patient location during evaluation: Mother Baby Anesthesia Type: Epidural Level of consciousness: awake Pain management: pain level controlled Respiratory status: spontaneous breathing Cardiovascular status: stable Postop Assessment: patient able to bend at knees, epidural receding, no headache, no backache and able to ambulate Anesthetic complications: no    Last Vitals:  Vitals:   08/13/18 0200 08/13/18 0600  BP: 107/60 (!) 109/59  Pulse: (!) 105 (!) 111  Resp: 18 20  Temp: 36.9 C 37.1 C  SpO2: 90% 94%    Last Pain:  Vitals:   08/13/18 0600  TempSrc: Oral  PainSc: 7    Pain Goal: Patients Stated Pain Goal: 5 (08/12/18 2200)               Edison PaceWILKERSON,[name]

## 2018-08-13 NOTE — Progress Notes (Signed)
Patient ID: Beth Bray, female   DOB: 10/03/1995, 22 y.o.   MRN: [id number] Called by nurse to evaluate pt d/t onset sob, cp.   S: pt reports sudden onset sob, cp, feeling like throat was closing, lasted very briefly, feels much better now. Had been itching earlier, was given benadryl 25mg  PO @ 2031 Does have h/o anxiety/panic attacks, not sure if this was one  O: Pt semi-fowler's in bed, no acute distress, no work of breathing VSS, 97.7, HR 100-109, RR 20, BP 117/78, O2 sat 95% RA HRRR, LCTAB Legs trace edema, neg Homan's bilaterally  A: POD#1 PLTCS, got 1st dose of Lovenox this am @ 0900 PP Anemia, Hgb 9.0>7.2>6.3, is s/p 3uPRBC-last unit ended at 2030, has repeat CBC in for 0030 Low suspicion for PE, very brief/acute event, currently asymptomatic, VSS Possible blood tx reaction, although occurred 45min s/p benadryl Possible panic attack Will continue to observe Discussed w/ Dr. Adrian BlackwaterStinson, no further work-up needed at this time  Cheral MarkerKimberly R. Ishani Goldwasser, CNM, [facility]NP-BC 08/13/2018 9:47 PM

## 2018-08-13 NOTE — Progress Notes (Signed)
CBC ordered for four hours post blood transfusion as relayed to me in handoff report.

## 2018-08-13 NOTE — Progress Notes (Addendum)
Called T/S for patient feeling SOB and sensation of throat closing up.

## 2018-08-13 NOTE — Progress Notes (Signed)
Notified MD of critical hemoglobin 6.3.

## 2018-08-13 NOTE — Addendum Note (Signed)
Addendum  created 08/13/18 0756 by Earmon PhoenixWilkerson, Mihir Flanigan P, CRNA   Sign clinical note

## 2018-08-14 LAB — TYPE AND SCREEN
ABO/RH(D): O POS
Antibody Screen: NEGATIVE
Unit division: 0
Unit division: 0
Unit division: 0
Unit division: 0
Unit division: 0
Unit division: 0

## 2018-08-14 LAB — BPAM RBC
BLOOD PRODUCT EXPIRATION DATE: 201912252359
BLOOD PRODUCT EXPIRATION DATE: 201912252359
Blood Product Expiration Date: 201912142359
Blood Product Expiration Date: 201912152359
Blood Product Expiration Date: 201912152359
Blood Product Expiration Date: 201912242359
ISSUE DATE / TIME: 201912031323
ISSUE DATE / TIME: 201912031007
ISSUE DATE / TIME: 201912031623
UNIT TYPE AND RH: 5100
Unit Type and Rh: 5100
Unit Type and Rh: 5100
Unit Type and Rh: 9500
Unit Type and Rh: 9500
Unit Type and Rh: 9500

## 2018-08-14 LAB — CBC
HCT: 24.5 % — ABNORMAL LOW (ref 36.0–46.0)
Hemoglobin: 8 g/dL — ABNORMAL LOW (ref 12.0–15.0)
MCH: 25.6 pg — AB (ref 26.0–34.0)
MCHC: 32.7 g/dL (ref 30.0–36.0)
MCV: 78.3 fL — AB (ref 80.0–100.0)
PLATELETS: 220 10*3/uL (ref 150–400)
RBC: 3.13 MIL/uL — AB (ref 3.87–5.11)
RDW: 18.3 % — ABNORMAL HIGH (ref 11.5–15.5)
WBC: 11.5 10*3/uL — ABNORMAL HIGH (ref 4.0–10.5)
nRBC: 0 % (ref 0.0–0.2)

## 2018-08-14 MED ORDER — PANTOPRAZOLE SODIUM 40 MG PO TBEC
40.0000 mg | DELAYED_RELEASE_TABLET | Freq: Every day | ORAL | Status: DC
  Administered 2018-08-14: 40 mg via ORAL
  Filled 2018-08-14: qty 1

## 2018-08-14 MED ORDER — POLYETHYLENE GLYCOL 3350 17 G PO PACK
17.0000 g | PACK | Freq: Every day | ORAL | Status: DC
  Administered 2018-08-14: 17 g via ORAL
  Filled 2018-08-14 (×2): qty 1

## 2018-08-14 NOTE — Progress Notes (Signed)
Called T/S. Patient with CP

## 2018-08-14 NOTE — Progress Notes (Signed)
Patient ID: Beth Bray, female   DOB: 07/19/1996, 22 y.o.   MRN: [id number]  Patient seen - having some intermittent shortness of breath with chest discomfort. Worse with cough and deep inspiration. No calf pain, claudication. States that right leg is swollen.  BP 109/65 (BP Location: Right Arm)   Pulse 91   Temp 97.7 F (36.5 C) (Axillary)   Resp (!) 24   Ht 5\' 3"  (1.6 m)   Wt 75.9 kg   LMP 11/04/2017   SpO2 99%   Breastfeeding? Unknown   BMI 29.63 kg/m   A&Ox3. NAD. No respiratory distress. Chest wall tender to palpation Nonlabored breathing.  RR NO evidence of DVT - no calf tenderness, unilateral leg swelling, erythema.   Wells criteria of PE - score 1.5. Dx of PE unlikely. More likely GERD vs anxiety. Increase antacid therapy to include protonix 40mg  daily.  Beth HeritageJacob J Sahira Cataldi, DO 08/14/2018 7:30 AM

## 2018-08-14 NOTE — Lactation Note (Signed)
This note was copied from a baby's chart. Lactation Consultation Note: Follow up visit with mom, Debbie RN has assisted mom with attempt at breast feeding with NS. Reports baby is not latching well. Bottle feeding formula now as baby will be going under phototherapy. Mom eating breakfast, Encouraged to pump after she finishes breakfast to promote milk supply. Reviewed use of pump with mom, Encouraged mom to call for assist when baby ready to feed again. No questions at present,  Patient Name: Beth Bray Today's Date: 08/14/2018     Maternal Data    Feeding    LATCH Score                   Interventions    Lactation Tools Discussed/Used     Consult Status      Pamelia HoitWeeks, Almas Rake D 08/14/2018, 10:31 AM

## 2018-08-14 NOTE — Lactation Note (Signed)
This note was copied from a baby's chart. Lactation Consultation Note Mom states baby isn't latching well. Mom has short shaft nipples. Compressible but probably not as compressible as needed for baby to obtain deep latch. Baby sleeping. Mom stated she has given formula so baby isn't ready to BF. Asked mom if she has wore her shells, mom stated no she didn't have her bra, she is sending her mom after her bra in am. Mom is using Rush BarerGerber formula but hopes to get baby latching well to her breast. Discussed positioning for feeding. encouraged hand expression and STS.  Mom states she is in pain at this moment, needs pain medication. Encouraged to call staff for assistance. Mom didn't want assistance at this time.  Patient Name: Beth Lexine BatonChante Giarratano YSAYT'KToday's Date: 08/14/2018     Maternal Data    Feeding    LATCH Score                   Interventions    Lactation Tools Discussed/Used     Consult Status      Jacqulene Huntley, Diamond NickelLAURA G 08/14/2018, 3:24 AM

## 2018-08-14 NOTE — Progress Notes (Signed)
Relayed high respiratory rate to CNM

## 2018-08-14 NOTE — Progress Notes (Signed)
CLINICAL SOCIAL WORK MATERNAL/CHILD NOTE  Patient Details  Name: Beth Bray MRN: [id number] Date of Birth: 08/12/2016  Date:  08/14/2018  Clinical Social Worker Initiating Note:  Beth [place], [place]   Date/Time: Initiated:  08/14/18/1130             Child's Name:  Beth Bray   Biological Parents:  Mother, Father(Father - Beth Bray)   Need for Interpreter:  None   Reason for Referral:  Current Substance Use/Substance Use During Pregnancy ((+) [facility])   Address:  [address] [place] [address]    Phone number:  [phone] (home)     Additional phone number:   Household Members/Support Persons (HM/SP):   Household Member/Support Person 1   HM/SP Name Relationship DOB or Age  HM/SP -1  MOB mother    HM/SP -2     HM/SP -3     HM/SP -4     HM/SP -5     HM/SP -6     HM/SP -7     HM/SP -8       Natural Supports (not living in the home):     Professional Supports:None   Employment:Unemployed   Type of Work:     Education:  9 to 11 years   Homebound arranged:    Financial Resources:Medicaid   Other [facility]   Cultural/Religious Considerations Which May Impact Care:   Strengths: Ability to meet basic needs , Home prepared for child , Pediatrician chosen   Psychotropic Medications:         [place]  Pediatrician List:   [place], Onalee [place]  [place]   [place]   [place]   [place]   [place]     Pediatrician Fax Number:    Risk Factors/Current Problems: Substance Use    Cognitive State: Alert , Able to Concentrate , Linear Thinking , Insightful    Mood/Affect: Calm , Interested    CSW Assessment:CSW spoke with MOB at bedside regarding consult for substance use, maternal grandmother present. MOB granted CSW permission to speak in front of her mother about anything. CSW introduced self and  explained reason for consult. MOB reported that she resides with her mother and her mother is her sole source of support. MOB reported that FOB is barely present. Pediatrician entered room to see the infant, CSW stopped assessment and waited for pediatrician to finish. Pediatrician finished and exited room, CSW continued assessment.   CSW inquired about MOB's mental health history, MOB reported that she was diagnosed with anxiety/depression in 2016 and [facility] when she became pregnant. MOB reported that she is considering restarting therapy now that she has given birth. CSW inquired about MOB's anxiety/depression during pregnancy, MOB reported having depressive symptoms and having panic attacks during pregnancy. CSW and MOB discussed triggers and coping skills, MOB reported that she has been trying to avoid people and environments that provoke panic attacks. MOB reported that her mom is a great support and that she is able to speak with her about anything. CSW encouraged MOB to follow up with therapy services if she continues to have depressive symptoms and anxiety attacks. MOB reported that she feels better now that she has her baby.   CSW provided education regarding the baby blues period vs. perinatal mood disorders, discussed treatment and gave resources for mental health follow up if concerns arise.  CSW recommends self-evaluation during the postpartum  time period using the New Mom Checklist from Postpartum Progress and encouraged MOB to contact a medical professional if symptoms are noted at any time.    CSW provided review of Sudden Infant Death Syndrome (SIDS) precautions.    CSW informed MOB about hospital drug policy and inquired about MOB's substance use, MOB reported that she smoked marijuana before pregnancy. MOB reported that she did not smoke during her pregnancy. CSW informed MOB that she had a positive drug screen in September, MOB was not able to  recall. CSW informed MOB that baby's UDS was negative and CDS will continue to be monitored and a CPS report would be made if warranted. CSW asked MOB if she had any questions, MOB reported none.   CSW identifies no further need for intervention and no barriers to discharge at this time.  CSW Plan/Description: No Further Intervention Required/No Barriers to Discharge, Sudden Infant Death Syndrome (SIDS) Education, Perinatal Mood and Anxiety Disorder (PMADs) Education, Hospital Drug Screen Policy Information, CSW Will Continue to Monitor Umbilical Cord Tissue Drug Screen Results and Make Report if Jamelle Rushing, LCSW 08/14/2018, 11:43 AM

## 2018-08-15 ENCOUNTER — Other Ambulatory Visit: Payer: Self-pay

## 2018-08-15 ENCOUNTER — Encounter: Payer: Self-pay | Admitting: Obstetrics & Gynecology

## 2018-08-15 DIAGNOSIS — D649 Anemia, unspecified: Secondary | ICD-10-CM | POA: Diagnosis not present

## 2018-08-15 MED ORDER — POLYETHYLENE GLYCOL 3350 17 G PO PACK: 17.0000 g | PACK | Freq: Every day | ORAL | 0 refills | Status: DC

## 2018-08-15 MED ORDER — IBUPROFEN 600 MG PO TABS: 600.0000 mg | ORAL_TABLET | Freq: Four times a day (QID) | ORAL | 0 refills | Status: DC

## 2018-08-15 MED ORDER — OXYCODONE HCL 5 MG PO TABS: 5.0000 mg | ORAL_TABLET | ORAL | 0 refills | Status: AC | PRN

## 2018-08-15 MED ORDER — SIMETHICONE 80 MG PO CHEW: 80.0000 mg | CHEWABLE_TABLET | Freq: Four times a day (QID) | ORAL | 0 refills | Status: DC | PRN

## 2018-08-15 NOTE — Discharge Instructions (Signed)

## 2018-08-15 NOTE — Discharge Summary (Addendum)
OB Discharge Summary     Patient Name: Beth BatonChante Sabala DOB: 11/14/1995 MRN: [id number]  Date of admission: 08/11/2018 Delivering MD: [place] BingPICKENS, CHARLIE   Date of discharge: 08/15/2018  Admitting diagnosis: 40 WKS CTX Intrauterine pregnancy: 3533w1d     Secondary diagnosis:  Active Problems:   Supervision of low-risk first pregnancy   Indication for care in labor or delivery   Postoperative anemia  Additional problems: none     Discharge diagnosis: Term Pregnancy Delivered                                                                                                Post partum procedures:blood transfusion x3 units  Augmentation: AROM and Pitocin  Complications: None  Hospital course:  Onset of Labor With Unplanned C/S  22 y.o. yo G1P1001 at 3633w1d was admitted in Latent Labor on 08/11/2018. Patient had a labor course significant for arrest of dilation at approx 6cm despite many hours of Pitocin- proceeded to cesarean. Membrane Rupture Time/Date: 2:07 PM ,08/11/2018   The patient went for cesarean section due to Arrest of Dilation, and delivered a Viable infant,08/12/2018  Details of operation can be found in separate operative note. Patient had a postpartum course remarkable for having a Hgb of 6.3 requiring bld tx of 3 units and f/u Hgb of 8.0. She also had some intermittent chest pain and SOB while postpartum, though to be due to GERD and anxiety upon work up. She is ambulating,tolerating a regular diet, passing flatus, and urinating well.  Patient is discharged home in stable condition 08/15/18.   Physical exam  Vitals:   08/14/18 0649 08/14/18 1457 08/15/18 0218 08/15/18 0503  BP: 109/65 114/85 114/69 116/73  Pulse: 91 90 92 84  Resp: (!) 24 20    Temp: 97.7 F (36.5 C) 97.8 F (36.6 C) 97.7 F (36.5 C) 98.1 F (36.7 C)  TempSrc: Axillary Oral  Oral  SpO2: 99%  97%   Weight:      Height:       General: alert, cooperative and no distress Lochia: appropriate Uterine Fundus:  firm Incision: Healing well with no significant drainage, No significant erythema, Dressing is clean, dry, and intact DVT Evaluation: No evidence of DVT seen on physical exam. Labs: Lab Results  Component Value Date   WBC 11.5 (H) 08/14/2018   HGB 8.0 (L) 08/14/2018   HCT 24.5 (L) 08/14/2018   MCV 78.3 (L) 08/14/2018   PLT 220 08/14/2018   CMP Latest Ref Rng & Units 08/12/2018  Glucose 70 - 99 mg/dL -  BUN 6 - 20 mg/dL -  Creatinine 1.470.44 - 8.291.00 mg/dL 5.62(Z1.11(H)  Sodium 308135 - 657145 mmol/L -  Potassium 3.5 - 5.1 mmol/L -  Chloride 98 - 111 mmol/L -  CO2 22 - 32 mmol/L -  Calcium 8.9 - 10.3 mg/dL -  Total Protein 6.5 - 8.1 g/dL -  Total Bilirubin 0.3 - 1.2 mg/dL -  Alkaline Phos 38 - 846126 U/L -  AST 15 - 41 U/L -  ALT 0 - 44 U/L -    Discharge instruction: per After  Visit Summary and "Baby and Me Booklet".  After visit meds:  Allergies as of 08/15/2018      Reactions   Kiwi Extract Swelling   Pineapple Other (See Comments)   Bumps and sores in mouth      Medication List    STOP taking these medications   acetaminophen 325 MG tablet Commonly known as:  TYLENOL   COMFORT FIT MATERNITY SUPP SM Misc   cyclobenzaprine 10 MG tablet Commonly known as:  FLEXERIL   metoCLOPramide 10 MG tablet Commonly known as:  REGLAN   ranitidine 150 MG tablet Commonly known as:  ZANTAC     TAKE these medications   albuterol 108 (90 Base) MCG/ACT inhaler Commonly known as:  PROVENTIL HFA;VENTOLIN HFA Inhale 3 puffs into the lungs every 4 (four) hours as needed for wheezing.   ibuprofen 600 MG tablet Commonly known as:  ADVIL,MOTRIN Take 1 tablet (600 mg total) by mouth every 6 (six) hours.   multivitamin-prenatal 27-0.8 MG Tabs tablet Take 1 tablet by mouth daily at 12 noon.   oxyCODONE 5 MG immediate release tablet Commonly known as:  Oxy IR/ROXICODONE Take 1 tablet (5 mg total) by mouth every 4 (four) hours as needed for up to 3 days (pain scale 4-7).   polyethylene glycol  packet Commonly known as:  MIRALAX / GLYCOLAX Take 17 g by mouth daily.   simethicone 80 MG chewable tablet Commonly known as:  MYLICON Chew 1 tablet (80 mg total) by mouth every 6 (six) hours as needed for flatulence.       Diet: routine diet  Activity: Advance as tolerated. Pelvic rest for 6 weeks.   Outpatient follow up:2 weeks Follow up Appt: Future Appointments  Date Time Provider Department Center  08/28/2018  4:15 PM [facility] NURSE [facility]  09/17/2018  2:15 PM Gwenevere Abbot, MD [facility]   Follow up Visit:No follow-ups on file.  Postpartum contraception: Abstinence and Undecided  Newborn Data: Live born female  Birth Weight: 9 lb 5.6 oz (4240 g) APGAR: 1, 2  Newborn Delivery   Birth date/time:  08/12/2018 12:29:00 Delivery type:  C-Section, Low Transverse Trial of labor:  Yes C-section categorization:  Primary     Baby Feeding: Breast Disposition:home with mother   08/15/2018 Leeroy Bock, DO  CNM attestation I have seen and examined this patient and agree with above documentation in the resident's note.   Larna Capelle is a 22 y.o. G1P1001 s/p pLTCS for FTP.   Pain is well controlled.  Plan for birth control is no method.  Method of Feeding: breast  PE:  BP 116/73   Pulse 84   Temp 98.1 F (36.7 C) (Oral)   Resp 20   Ht 5\' 3"  (1.6 m)   Wt 75.9 kg   LMP 11/04/2017   SpO2 97%   Breastfeeding? Unknown   BMI 29.63 kg/m   Pt left prior to being evaluated by me personally  Recent Labs    08/13/18 0527 08/14/18 0057  HGB 6.3* 8.0*  HCT 19.6* 24.5*     Plan: discharge today - f/u clinic in 2wks for an incision check, then 4-6 weeks for postpartum visit   Arabella Merles, CNM 12:25 PM  08/15/2018

## 2018-08-15 NOTE — Lactation Note (Signed)
This note was copied from a baby's chart. Lactation Consultation Note Mom sleep w/baby in bed. Photo therapy is suppose to be doubled, light only on back, came undone in the front.  LC woke mom up reminded about the lights and safety of not sleeping w/baby.  Baby on DPT. Mom states she is BF but there is no documentation of BF only formula feeding. Mom stated her milk isn't in so she's giving formula. Reminded mom of importance of colostrum, supply and demand.  Encouraged mom to tell staff of BF so can be documented.    Patient Name: Beth Bray [id number]'QToday's Date: 08/15/2018 Reason for consult: Follow-up assessment;Hyperbilirubinemia   Maternal Data    Feeding Feeding Type: Bottle Fed - Formula Nipple Type: Slow - flow  LATCH Score                   Interventions    Lactation Tools Discussed/Used     Consult Status Consult Status: Follow-up Date: 08/16/18 Follow-up type: In-patient    Wahid Holley, Diamond NickelLAURA G 08/15/2018, 5:06 AM

## 2018-08-17 ENCOUNTER — Inpatient Hospital Stay (HOSPITAL_COMMUNITY): Admission: RE | Admit: 2018-08-17 | Payer: No Typology Code available for payment source | Source: Ambulatory Visit

## 2018-08-19 ENCOUNTER — Telehealth (HOSPITAL_COMMUNITY): Payer: Self-pay | Admitting: Lactation Services

## 2018-08-19 NOTE — Telephone Encounter (Signed)
Attempted to call mom in response to a my chart concern that she is having difficulty latching infant. There was no answer at this time. Left message for mom to call back at her convenience.

## 2018-08-26 ENCOUNTER — Telehealth (HOSPITAL_COMMUNITY): Payer: Self-pay | Admitting: Lactation Services

## 2018-08-26 NOTE — Telephone Encounter (Signed)
Called and spoke with mom in regards to BF questions. Mom reports infant will not latch. Mom is pumping 2 x a day [facility] Infant is feeding 8 x a day. Reviewed supply and demand and Enc mom to increase pumping to 8 x a day or anytime infant is being fed a bottle. Mom has OB appt on Wed. 12/18. She would like to schedule a Lactation appt on the same day. Told mom I would call her back after checking for a referral and appt times. Mom voiced understanding.

## 2018-08-28 ENCOUNTER — Ambulatory Visit (INDEPENDENT_AMBULATORY_CARE_PROVIDER_SITE_OTHER): Payer: Medicaid Other | Admitting: *Deleted

## 2018-08-28 ENCOUNTER — Ambulatory Visit: Payer: Self-pay

## 2018-08-28 VITALS — BP 122/73 | HR 113 | Wt 132.5 lb

## 2018-08-28 DIAGNOSIS — O99891 Other specified diseases and conditions complicating pregnancy: Secondary | ICD-10-CM

## 2018-08-28 DIAGNOSIS — O9989 Other specified diseases and conditions complicating pregnancy, childbirth and the puerperium: Secondary | ICD-10-CM

## 2018-08-28 DIAGNOSIS — M545 Low back pain, unspecified: Secondary | ICD-10-CM

## 2018-08-28 DIAGNOSIS — G8918 Other acute postprocedural pain: Secondary | ICD-10-CM

## 2018-08-28 DIAGNOSIS — M549 Dorsalgia, unspecified: Secondary | ICD-10-CM

## 2018-08-28 MED ORDER — IBUPROFEN 600 MG PO TABS: 600.0000 mg | ORAL_TABLET | Freq: Four times a day (QID) | ORAL | 0 refills | Status: DC

## 2018-08-28 MED ORDER — CYCLOBENZAPRINE HCL 5 MG PO TABS: 5.0000 mg | ORAL_TABLET | Freq: Three times a day (TID) | ORAL | 0 refills | Status: DC | PRN

## 2018-08-28 NOTE — Lactation Note (Signed)
This note was copied from a baby's chart. 08/28/2018  Name: Beth Bray MRN: [id number] Date of Birth: 08/12/2016 Gestational Age: Gestational Age: [redacted]w[redacted]d Birth Weight: 149.6 oz Weight today:    9 pounds 7 ounces (4282 grams) with clean size 19 diaper  60 week old infant presents today with mom for feeding assessment. Mom is worried that infant is not latching to the breast and she is trying regularly.   Infant has gained 251 grams in the last 14 days with an average daily weight gain of 18 grams a day. Per GM and mom infant weighed the same weight today that he weighed on Monday. GM and. mom reports infant is still hungry after most feedings. Discussed infant should be gaining about an ounce a day. Enc them to increase volumes with each feeding as infant will tolerate.  Infant with white patches to tongue, lips and cheeks. Infant has prescription for Diflucan that family is to pick up today. Infant with strong suckle. Yeast treastment for mom and infant reviewed and handout given. Enc mom to boil all pump parts, bottles and pacifier daily. Enc mom to apply Monistat or Lotrimin AF to nipple post feeding. Enc mom to wash bras and dry in hot dryer daily. Enc mom to change breast pads daily.   GM and mom reports they were told to add a scoop of formula powder to 2 ounces of EBM, advised them to mix formula powder with water and to feed EBM prior to offering formula. Mom has prescription for Neosure 22 calorie formula   Mom with inverted nipple to right breast, nipple everts with stimulation. Left nipple is everted. Mom with no pain to nipples.   Infant latched easily to the right breast, although he did not transfer from that breast. He was not willing to latch to the left breast. # 24 NS was applied and infant latched easily and transferred 14 ml prior to becoming upset. Nipples rounded post feeding, no pain noted with feeding. Infant was then offered a bottle.    Mom is pumping although not  regularly. Reviewed supply and demand and importance of emptying the breasts regularly to protect milk supply. Mom has Symphony pump at home for use.   Infant to follow up with Dr. Donnie Coffin on 12/24. Mom has not been called by [facility] LC left message with [facility] to call mom to schedule follow up with mom. Infant to follow up with Lactation in 2 weeks.   mom reports she is having difficulty with pain. Mom to see OB today. Mom reports she usually sees [facility] in the clinic but feels she is ok right now. Enc mom to rest as she is able.      General Information: Mother's reason for visit: Feeding assessment, difficulty with latching Consult: Initial Lactation consultant: Noralee Stain RN,IBCLC Breastfeeding experience: will not latch, pumping and bottle feeding   Maternal medications: Pre-natal vitamin, Motrin (ibuprofen)  Breastfeeding History: Frequency of breast feeding: trying several times a day Duration of feeding: will not latch  Supplementation: Supplement method: bottle(Dr. Manson Passey) BrandDaron Offer Formula volume: 3 ounces Formula frequency: 4-5 x a day Total formula volume per day: 12-15 ounces Breast milk volume: 3 ounces 2-3 x a day Breast milk frequency: 2-3 x a day Total breast milk volume per day: 6-9 ounces Pump type: Symphony Pump frequency: every 3 hours, did not pump at all last night Pump volume: 3 ounces  Infant Output Assessment: Voids per 24 hours: 8  Urine color: Clear yellow Stools per 24 hours: 2-3 Stool color: Yellow  Breast Assessment: Breast: Soft, Compressible Nipple: Erect, Inverted(left nipple everted, right nipple inverted) Pain level: 0 Pain interventions: Bra  Feeding Assessment:     Positioning: Football(right breast) Latch: 2 - Grasps breast easily, tongue down, lips flanged, rhythmical sucking. Audible swallowing: 1 - A few with stimulation Type of nipple: 0 - Inverted Comfort: 2 - Soft/non-tender Hold: 1  - Assistance needed to correctly position infant at breast and maintain latch LATCH score: 6 Latch assessment: Deep Lips flanged: Yes Suck assessment: Displays both   Pre-feed weight: 4282 grams Post feed weight: 4282 grams Amount transferred: 0 Amount supplemented: 0  Additional Feeding Assessment:     Positioning: Football(left breast, 10 mintues) Latch: 2 - Grasps breast easily, tongue down, lips flanged, rhythmical sucking. Audible swallowing: 2 - Spontaneous and intermittent Type of nipple: 2 - Everted at rest and after stimulation Comfort: 1 - Filling, red/small blisters or bruises, mild/mod discomfort Hold: 1 - Assistance needed to correctly position infant at breast and maintain latch LATCH score: 8 Latch assessment: Deep Lips flanged: Yes Suck assessment: Displays both Tools: Nipple shield 24 mm Pre-feed weight: 4280 grams Post feed weight: 4294 grams Amount transferred: 14 ml Amount supplemented: 60 ml formula via Dr. Theora GianottiBrown's bottle  Totals: Total amount transferred: 14 ml Total supplement given: 60 ml formula via Dr. Theora GianottiBrown's Bottle Total amount pumped post feed: did not pump   Plan:  1. Offer breast with feeding cues as mom and infant want, offer him 1/2-1 ounce in the bottle if he is upset at the breast. Try to latch him when he is calm.  2. The more he practices the better he will be 3. Every nipples with your manual pump or your fingers 4. Use the # 24 Nipple Shield with feeding as needed for latch, try each feeding without it first and then apply if needed 5. Offer infant a bottle of pumped breast milk or formula after breast feeding. Feed him breast milk and then offer him formula if he is still hungry.  6. Continue to use the Dr. Theora GianottiBrown's Bottle for feeding 7. Feed infant using the paced bottle feeding method (video on [email or web address].[email or web address]) 8. Infant needs about 79-105 ml (2.5-3.5 ounces) for 8 feedings a day or 630-840 ml (21-28 ounces) in 24 hours. Infant may take  more or less depending on how often he feeds. Feed infant until he is satisfied.  9. To protect your milk supply, would recommend that you pump 7-8 x a day with your Symphony pump. Feed all milk to infant. 10. Follow Patient Instruction for care of Thrush for Mother and Baby Handout 3311. Give infant his Diflucan as prescribed 12. Call Pediatrician if his diaper rash worsens 13. Keep up the good work 14. Call with any questions/concerns as needed [phone] 15. Thank you for allowing me to assist you today 16. Can call [facility] to inquire about a weight at home [phone] 17. Follow up with Lactation in 2 weeks Ed BlalockSharon S Kaylena Pacifico RN, IBCLC                                                        Ed BlalockSharon S Delanda Bulluck 08/28/2018, 2:19 PM

## 2018-08-28 NOTE — Progress Notes (Signed)
Here for wound check 2 weeks post c/s. Wound clean , dry , intact.- no redness, or drainage or edema.  C/o had fever last week . Also request pain med - states takes ibuprofen every 6 hours but it doesn't help at all. Wants refill on percocet. Informed her I would need to talk with Md.   Discussed withDr. Constant and may not have refill of percocet or narcotic. May take ibuprofen 600 mg every 6 hours and alternate with tylenol- do not exceed recommended dosage on container. Also advised to keep postpartum appointment. Patient requested refill of flexeril and ibuprofen for incision pain, back pain and rest. Approved by Dr. Shawnie PonsPratt and rx sent in.  Tienna Bienkowski,RN

## 2018-08-28 NOTE — Progress Notes (Signed)
Patient seen and assessed by nursing staff.  Agree with documentation and plan.  

## 2018-09-17 ENCOUNTER — Encounter: Payer: Self-pay | Admitting: Family Medicine

## 2018-09-17 ENCOUNTER — Encounter: Payer: Self-pay | Admitting: *Deleted

## 2018-09-17 ENCOUNTER — Ambulatory Visit (INDEPENDENT_AMBULATORY_CARE_PROVIDER_SITE_OTHER): Payer: Medicaid Other | Admitting: Family Medicine

## 2018-09-17 DIAGNOSIS — Z1389 Encounter for screening for other disorder: Secondary | ICD-10-CM

## 2018-09-17 DIAGNOSIS — Z98891 History of uterine scar from previous surgery: Secondary | ICD-10-CM | POA: Insufficient documentation

## 2018-09-17 MED ORDER — NORGESTIMATE-ETH ESTRADIOL 0.25-35 MG-MCG PO TABS: 1.0000 | ORAL_TABLET | Freq: Every day | ORAL | 11 refills | Status: DC

## 2018-09-17 NOTE — Progress Notes (Signed)
Subjective:     Beth Bray is a 23 y.o. female who presents for a postpartum visit. She is 5 week postpartum following a pLTCS for arrest of dilation. I have fully reviewed the prenatal and intrapartum course. The delivery was at 40.1 gestational weeks. Outcome: primary cesarean section, low vertical incision. Anesthesia: spinal. Postpartum course has been unremarkanle. Baby's course has been unrem,arkanle. Baby is feeding by bottle - Lucien Mons Start. Bleeding no bleeding. Bowel function is normal. Bladder function is normal. Patient is not sexually active. Contraception method is oral progesterone-only contraceptive. Postpartum depression screening: negative.   Review of Systems A comprehensive review of systems was negative.   Objective:    LMP 11/04/2017   General:  alert, cooperative, appears stated age and no distress   Breasts:  deferred  Lungs: clear to auscultation bilaterally and nonlabored breathing  Heart:  regular rate and rhythm, S1, S2 normal, no murmur, click, rub or gallop  Abdomen: soft, non-tender; bowel sounds normal; no masses,  no organomegaly and incision well healed, c/d/i   Neuro:  alert and oriented. No gross cranial nerve deficits. Normal gait  Psych:  normal judgement, behavior and thought process  GU:  deferred        Assessment:     Normal postpartum exam. Pap smear not done at today's visit.   Plan:    1. Contraception: OCP (estrogen/progesterone) 2. No concerns 3. Follow up in: 9 months for 1st pap or as needed.

## 2019-12-26 ENCOUNTER — Other Ambulatory Visit: Payer: Self-pay | Admitting: Internal Medicine

## 2019-12-29 ENCOUNTER — Other Ambulatory Visit: Payer: Self-pay | Admitting: Internal Medicine

## 2019-12-30 ENCOUNTER — Other Ambulatory Visit: Payer: Self-pay | Admitting: Internal Medicine

## 2019-12-30 DIAGNOSIS — R1030 Lower abdominal pain, unspecified: Secondary | ICD-10-CM

## 2019-12-30 DIAGNOSIS — R109 Unspecified abdominal pain: Secondary | ICD-10-CM

## 2019-12-31 ENCOUNTER — Ambulatory Visit
Admission: RE | Admit: 2019-12-31 | Discharge: 2019-12-31 | Disposition: A | Discharge status: Home / Self Care | Payer: Medicaid Other | Source: Ambulatory Visit | Attending: Internal Medicine | Admitting: Internal Medicine

## 2019-12-31 DIAGNOSIS — R1030 Lower abdominal pain, unspecified: Secondary | ICD-10-CM

## 2020-01-01 ENCOUNTER — Other Ambulatory Visit: Payer: Medicaid Other

## 2020-01-29 ENCOUNTER — Ambulatory Visit: Payer: Medicaid Other | Admitting: Family Medicine

## 2020-02-02 ENCOUNTER — Other Ambulatory Visit: Payer: Medicaid Other

## 2020-02-05 DIAGNOSIS — J45909 Unspecified asthma, uncomplicated: Secondary | ICD-10-CM | POA: Insufficient documentation

## 2020-02-05 DIAGNOSIS — F419 Anxiety disorder, unspecified: Secondary | ICD-10-CM | POA: Insufficient documentation

## 2020-02-11 ENCOUNTER — Other Ambulatory Visit: Payer: Self-pay | Admitting: Internal Medicine

## 2020-02-11 DIAGNOSIS — E049 Nontoxic goiter, unspecified: Secondary | ICD-10-CM

## 2020-02-16 ENCOUNTER — Other Ambulatory Visit: Payer: Medicaid Other

## 2020-02-17 ENCOUNTER — Other Ambulatory Visit: Payer: Medicaid Other

## 2020-02-24 ENCOUNTER — Encounter (HOSPITAL_COMMUNITY): Payer: Self-pay | Admitting: Obstetrics & Gynecology

## 2020-02-24 ENCOUNTER — Inpatient Hospital Stay (HOSPITAL_COMMUNITY)
Admission: AD | Admit: 2020-02-24 | Discharge: 2020-02-24 | Disposition: A | Discharge status: Home / Self Care | Payer: Medicaid Other | Attending: Obstetrics & Gynecology | Admitting: Obstetrics & Gynecology

## 2020-02-24 ENCOUNTER — Other Ambulatory Visit: Payer: Self-pay

## 2020-02-24 ENCOUNTER — Inpatient Hospital Stay (HOSPITAL_COMMUNITY): Payer: Medicaid Other

## 2020-02-24 DIAGNOSIS — O99511 Diseases of the respiratory system complicating pregnancy, first trimester: Secondary | ICD-10-CM | POA: Diagnosis not present

## 2020-02-24 DIAGNOSIS — J45909 Unspecified asthma, uncomplicated: Secondary | ICD-10-CM | POA: Insufficient documentation

## 2020-02-24 DIAGNOSIS — O26891 Other specified pregnancy related conditions, first trimester: Secondary | ICD-10-CM | POA: Diagnosis present

## 2020-02-24 DIAGNOSIS — R102 Pelvic and perineal pain: Secondary | ICD-10-CM | POA: Diagnosis not present

## 2020-02-24 DIAGNOSIS — Z3A01 Less than 8 weeks gestation of pregnancy: Secondary | ICD-10-CM | POA: Insufficient documentation

## 2020-02-24 DIAGNOSIS — O3680X0 Pregnancy with inconclusive fetal viability, not applicable or unspecified: Secondary | ICD-10-CM

## 2020-02-24 DIAGNOSIS — Z6790 Unspecified blood type, Rh positive: Secondary | ICD-10-CM

## 2020-02-24 DIAGNOSIS — Z791 Long term (current) use of non-steroidal anti-inflammatories (NSAID): Secondary | ICD-10-CM | POA: Diagnosis not present

## 2020-02-24 DIAGNOSIS — Z79899 Other long term (current) drug therapy: Secondary | ICD-10-CM | POA: Diagnosis not present

## 2020-02-24 LAB — WET PREP, GENITAL
Clue Cells Wet Prep HPF POC: NONE SEEN
Sperm: NONE SEEN
Trich, Wet Prep: NONE SEEN
Yeast Wet Prep HPF POC: NONE SEEN

## 2020-02-24 LAB — COMPREHENSIVE METABOLIC PANEL
ALT: 17 U/L (ref 0–44)
AST: 20 U/L (ref 15–41)
Albumin: 4.4 g/dL (ref 3.5–5.0)
Alkaline Phosphatase: 63 U/L (ref 38–126)
Anion gap: 8 (ref 5–15)
BUN: 9 mg/dL (ref 6–20)
CO2: 23 mmol/L (ref 22–32)
Calcium: 9.5 mg/dL (ref 8.9–10.3)
Chloride: 107 mmol/L (ref 98–111)
Creatinine, Ser: 0.93 mg/dL (ref 0.44–1.00)
GFR calc Af Amer: >60 mL/min (ref >60)
GFR calc non Af Amer: >60 mL/min (ref >60)
Glucose, Bld: 99 mg/dL (ref 70–99)
Potassium: 4.1 mmol/L (ref 3.5–5.1)
Sodium: 138 mmol/L (ref 135–145)
Total Bilirubin: 0.7 mg/dL (ref 0.3–1.2)
Total Protein: 7.6 g/dL (ref 6.5–8.1)

## 2020-02-24 LAB — CBC
HCT: 40.8 % (ref 36.0–46.0)
Hemoglobin: 13 g/dL (ref 12.0–15.0)
MCH: 26.8 pg (ref 26.0–34.0)
MCHC: 31.9 g/dL (ref 30.0–36.0)
MCV: 84.1 fL (ref 80.0–100.0)
Platelets: 256 10*3/uL (ref 150–400)
RBC: 4.85 MIL/uL (ref 3.87–5.11)
RDW: 13.5 % (ref 11.5–15.5)
WBC: 6.3 10*3/uL (ref 4.0–10.5)
nRBC: 0 % (ref 0.0–0.2)

## 2020-02-24 LAB — HCG, QUANTITATIVE, PREGNANCY: hCG, Beta Chain, Quant, S: 91 m[IU]/mL — ABNORMAL HIGH (ref <5)

## 2020-02-24 LAB — URINALYSIS, ROUTINE W REFLEX MICROSCOPIC
Bilirubin Urine: NEGATIVE
Glucose, UA: NEGATIVE mg/dL
Hgb urine dipstick: NEGATIVE
Ketones, ur: NEGATIVE mg/dL
Leukocytes,Ua: NEGATIVE
Nitrite: NEGATIVE
Protein, ur: NEGATIVE mg/dL
Specific Gravity, Urine: 1.021 (ref 1.005–1.030)
pH: 7 (ref 5.0–8.0)

## 2020-02-24 LAB — POCT PREGNANCY, URINE: Preg Test, Ur: POSITIVE — AB

## 2020-02-24 NOTE — MAU Note (Signed)
Presents with c/o abdominal cramping.  Denies VB.  Wants to know how far she is in the pregnancy.  LMP 01/21/2020.  +HPT x3.

## 2020-02-24 NOTE — MAU Provider Note (Signed)
History     CSN: [id number]  Arrival date and time: 02/24/20 1027   First Provider Initiated Contact with Patient 02/24/20 1142      Chief Complaint  Patient presents with  . Cramping   Beth Bray is a 24 y.o. G2P1001 at [redacted]w[redacted]d who presents to MAU for pelvic cramping.  Onset: yesterday Location: bilateral pelvis Duration: ~24 hours Character: menstrual-like cramping Aggravating/Associated: none/nausea Relieving: none Treatment: none  Pt denies VB, vaginal discharge/odor/itching. Pt denies vomiting, abdominal pain, constipation, diarrhea, or urinary problems. Pt denies fever, chills, fatigue, sweating or changes in appetite. Pt denies SOB or chest pain. Pt denies dizziness, HA, light-headedness, weakness.  Problems this pregnancy include: pt has not yet been seen. Allergies? Kiwi, pineapple, NKDA Current medications/supplements? none Prenatal care provider? Pt requests list of OB providers   OB History    Gravida  3   Para  1   Term  1   Preterm  0   AB  0   Living  1     SAB  0   TAB  0   Ectopic  0   Multiple  0   Live Births  1           Past Medical History:  Diagnosis Date  . Anxiety   . Asthma   . Depression     Past Surgical History:  Procedure Laterality Date  . CESAREAN SECTION N/A 08/12/2018   Procedure: CESAREAN SECTION;  Surgeon: [place] Bing, MD;  Location: [facility] BIRTHING SUITES;  Service: Obstetrics;  Laterality: N/A;  . NO PAST SURGERIES    . WISDOM TOOTH EXTRACTION      Family History  Problem Relation Age of Onset  . Hypertension Mother   . Heart disease Mother   . Thyroid disease Mother     Social History   Tobacco Use  . Smoking status: Passive Smoke Exposure - Never Smoker  . Smokeless tobacco: Never Used  Vaping Use  . Vaping Use: Never used  Substance Use Topics  . Alcohol use: No  . Drug use: No    Allergies:  Allergies  Allergen Reactions  . Kiwi Extract Swelling  . Pineapple Other (See  Comments)    Bumps and sores in mouth    Medications Prior to Admission  Medication Sig Dispense Refill Last Dose  . albuterol (PROVENTIL HFA;VENTOLIN HFA) 108 (90 BASE) MCG/ACT inhaler Inhale 3 puffs into the lungs every 4 (four) hours as needed for wheezing. (Patient not taking: Reported on 09/17/2018) 1 Inhaler 0   . cyclobenzaprine (FLEXERIL) 5 MG tablet Take 1 tablet (5 mg total) by mouth every 8 (eight) hours as needed for muscle spasms. (Patient not taking: Reported on 09/17/2018) 30 tablet 0   . ibuprofen (ADVIL,MOTRIN) 600 MG tablet Take 1 tablet (600 mg total) by mouth every 6 (six) hours. (Patient not taking: Reported on 09/17/2018) 30 tablet 0   . norgestimate-ethinyl estradiol (ORTHO-CYCLEN,SPRINTEC,PREVIFEM) 0.25-35 MG-MCG tablet Take 1 tablet by mouth daily. 1 Package 11   . polyethylene glycol (MIRALAX / GLYCOLAX) packet Take 17 g by mouth daily. (Patient not taking: Reported on 09/17/2018) 14 each 0   . Prenatal Vit-Fe Fumarate-FA (MULTIVITAMIN-PRENATAL) 27-0.8 MG TABS tablet Take 1 tablet by mouth daily at 12 noon.     . simethicone (GAS-X) 80 MG chewable tablet Chew 1 tablet (80 mg total) by mouth every 6 (six) hours as needed for flatulence. (Patient not taking: Reported on 09/17/2018) 30 tablet 0  Review of Systems  Constitutional: Negative for chills, diaphoresis, fatigue and fever.  Eyes: Negative for visual disturbance.  Respiratory: Negative for shortness of breath.   Cardiovascular: Negative for chest pain.  Gastrointestinal: Positive for nausea. Negative for abdominal pain, constipation, diarrhea and vomiting.  Genitourinary: Negative for dysuria, flank pain, frequency, pelvic pain (pt describes as cramping, denies pain), urgency, vaginal bleeding and vaginal discharge.  Neurological: Negative for dizziness, weakness, light-headedness and headaches.   Physical Exam   Blood pressure 111/64, pulse 77, temperature 98.6 F (37 C), temperature source Oral, resp. rate 20,  height 5\' 3"  (1.6 m), weight 70.4 kg, last menstrual period 01/21/2020, SpO2 100 %, unknown if currently breastfeeding.  Patient Vitals for the past 24 hrs:  BP Temp Temp src Pulse Resp SpO2 Height Weight  02/24/20 1044 111/64 98.6 F (37 C) Oral 77 20 100 % -- --  02/24/20 1043 -- -- -- -- -- -- 5\' 3"  (1.6 m) 70.4 kg   Physical Exam  Constitutional: She is oriented to person, place, and time. She appears well-developed. No distress.  HENT:  Head: Normocephalic and atraumatic.  Respiratory: Effort normal.  GI: Soft. She exhibits no distension and no mass. There is no abdominal tenderness. There is no rebound and no guarding.  Neurological: She is alert and oriented to person, place, and time.  Skin: Skin is warm and dry. She is not diaphoretic.  Psychiatric: Her behavior is normal. Judgment and thought content normal.   Results for orders placed or performed during the hospital encounter of 02/24/20 (from the past 24 hour(s))  Pregnancy, urine POC     Status: Abnormal   Collection Time: 02/24/20 10:50 AM  Result Value Ref Range   Preg Test, Ur POSITIVE (A) NEGATIVE  Urinalysis, Routine w reflex microscopic     Status: None   Collection Time: 02/24/20 11:08 AM  Result Value Ref Range   Color, Urine YELLOW YELLOW   APPearance CLEAR CLEAR   Specific Gravity, Urine 1.021 1.005 - 1.030   pH 7.0 5.0 - 8.0   Glucose, UA NEGATIVE NEGATIVE mg/dL   Hgb urine dipstick NEGATIVE NEGATIVE   Bilirubin Urine NEGATIVE NEGATIVE   Ketones, ur NEGATIVE NEGATIVE mg/dL   Protein, ur NEGATIVE NEGATIVE mg/dL   Nitrite NEGATIVE NEGATIVE   Leukocytes,Ua NEGATIVE NEGATIVE  CBC     Status: None   Collection Time: 02/24/20 11:52 AM  Result Value Ref Range   WBC 6.3 4.0 - 10.5 K/uL   RBC 4.85 3.87 - 5.11 MIL/uL   Hemoglobin 13.0 12.0 - 15.0 g/dL   HCT 40.8 36 - 46 %   MCV 84.1 80.0 - 100.0 fL   MCH 26.8 26.0 - 34.0 pg   MCHC 31.9 30.0 - 36.0 g/dL   RDW 13.5 11.5 - 15.5 %   Platelets 256 150 - 400  K/uL   nRBC 0.0 0.0 - 0.2 %  Comprehensive metabolic panel     Status: None   Collection Time: 02/24/20 11:52 AM  Result Value Ref Range   Sodium 138 135 - 145 mmol/L   Potassium 4.1 3.5 - 5.1 mmol/L   Chloride 107 98 - 111 mmol/L   CO2 23 22 - 32 mmol/L   Glucose, Bld 99 70 - 99 mg/dL   BUN 9 6 - 20 mg/dL   Creatinine, Ser 0.93 0.44 - 1.00 mg/dL   Calcium 9.5 8.9 - 10.3 mg/dL   Total Protein 7.6 6.5 - 8.1 g/dL   Albumin 4.4 3.5 -  5.0 g/dL   AST 20 15 - 41 U/L   ALT 17 0 - 44 U/L   Alkaline Phosphatase 63 38 - 126 U/L   Total Bilirubin 0.7 0.3 - 1.2 mg/dL   GFR calc non Af Amer >60 >60 mL/min   GFR calc Af Amer >60 >60 mL/min   Anion gap 8 5 - 15  hCG, quantitative, pregnancy     Status: Abnormal   Collection Time: 02/24/20 11:52 AM  Result Value Ref Range   hCG, Beta Chain, Quant, S 91 (H) <5 mIU/mL  Wet prep, genital     Status: Abnormal   Collection Time: 02/24/20 11:57 AM   Specimen: PATH Cytology Cervicovaginal Ancillary Only  Result Value Ref Range   Yeast Wet Prep HPF POC NONE SEEN NONE SEEN   Trich, Wet Prep NONE SEEN NONE SEEN   Clue Cells Wet Prep HPF POC NONE SEEN NONE SEEN   WBC, Wet Prep HPF POC FEW (A) NONE SEEN   Sperm NONE SEEN    US OB LESS THAN 14 WEEKS WITH OB TRANSVAGINAL  Result Date: 02/24/2020 CLINICAL DATA:  Pregnant patient with cramping. Quantitative HCG 91. EXAM: OBSTETRIC <14 WK [place] AND TRANSVAGINAL OB US TECHNIQUE: Both transabdominal and transvaginal ultrasound examinations were performed for complete evaluation of the gestation as well as the maternal uterus, adnexal regions, and pelvic cul-de-sac. Transvaginal technique was performed to assess early pregnancy. COMPARISON:  None. FINDINGS: Intrauterine gestational sac: Not visualized. Yolk sac:  Not visualized. Embryo:  Not visualized. Cardiac Activity: Not applicable. Subchorionic hemorrhage:  Not applicable. Maternal uterus/adnexae: Normal. IMPRESSION: No ultrasound evidence of pregnancy may be  due to early gestational age given quantitative HCG of 33. Recommend serial quantitative HCG and repeat ultrasound as clinically indicated. Electronically Signed   By: Drusilla Kanner M.D.   On: 02/24/2020 13:02    MAU Course  Procedures  MDM -r/o ectopic -UA: WNL -CBC: WNL -CMP: WNL -[place]: PUL -hCG: 91 -ABO: O Positive -WetPrep: WNL -GC/CT collected -pt discharged to home in stable condition  Orders Placed This Encounter  Procedures  . Wet prep, genital    Standing Status:   Standing    Number of Occurrences:   1  . US OB LESS THAN 14 WEEKS WITH OB TRANSVAGINAL    Standing Status:   Standing    Number of Occurrences:   1    Order Specific Question:   Symptom/Reason for Exam    Answer:   Pelvic cramping [[id number]]  . Urinalysis, Routine w reflex microscopic    Standing Status:   Standing    Number of Occurrences:   1  . CBC    Standing Status:   Standing    Number of Occurrences:   1  . Comprehensive metabolic panel    Standing Status:   Standing    Number of Occurrences:   1  . hCG, quantitative, pregnancy    Standing Status:   Standing    Number of Occurrences:   1  . Pregnancy, urine POC    Standing Status:   Standing    Number of Occurrences:   1  . Discharge patient    Order Specific Question:   Discharge disposition    Answer:   01-Home or Self Care [1]    Order Specific Question:   Discharge patient date    Answer:   02/24/2020    Assessment and Plan   1. Pregnancy of unknown anatomic location   2. Pelvic cramping  3. Blood type, Rh positive     Allergies as of 02/24/2020      Reactions   Kiwi Extract Swelling   Pineapple Other (See Comments)   Bumps and sores in mouth      Medication List    STOP taking these medications   ibuprofen 600 MG tablet Commonly known as: ADVIL   norgestimate-ethinyl estradiol 0.25-35 MG-MCG tablet Commonly known as: ORTHO-CYCLEN     TAKE these medications   albuterol 108 (90 Base) MCG/ACT inhaler Commonly  known as: VENTOLIN HFA Inhale 3 puffs into the lungs every 4 (four) hours as needed for wheezing.   cyclobenzaprine 5 MG tablet Commonly known as: FLEXERIL Take 1 tablet (5 mg total) by mouth every 8 (eight) hours as needed for muscle spasms.   multivitamin-prenatal 27-0.8 MG Tabs tablet Take 1 tablet by mouth daily at 12 noon.   polyethylene glycol 17 g packet Commonly known as: MIRALAX / GLYCOLAX Take 17 g by mouth daily.   simethicone 80 MG chewable tablet Commonly known as: Gas-X Chew 1 tablet (80 mg total) by mouth every 6 (six) hours as needed for flatulence.       -will call with culture results, if positive -safe meds in pregnancy list given with focus on Unisom and B6 -list of OB providers given -discussed ectopic vs. SAB vs. miscarriage -strict ectopic precautions given -return MAU precautions -f/u on Thursday 02/26/2020 at [facility] for repeat hCG -pt discharged to home in stable condition  Joni Reining E Levon Penning 02/24/2020, 1:25 PM

## 2020-02-24 NOTE — Progress Notes (Signed)
GC/Chlamydia & wet prep cultures obtained 

## 2020-02-24 NOTE — Discharge Instructions (Signed)
Ectopic Pregnancy  An ectopic pregnancy is when the fertilized egg attaches (implants) outside the uterus. Most ectopic pregnancies occur in one of the tubes where eggs travel from the ovary to the uterus (fallopian tubes), but the implanting can occur in other locations. In rare cases, ectopic pregnancies occur on the ovary, intestine, pelvis, abdomen, or cervix. In an ectopic pregnancy, the fertilized egg does not have the ability to develop into a normal, healthy baby. A ruptured ectopic pregnancy is one in which tearing or bursting of a fallopian tube causes internal bleeding. Often, there is intense lower abdominal pain, and vaginal bleeding sometimes occurs. Having an ectopic pregnancy can be life-threatening. If this dangerous condition is not treated, it can lead to blood loss, shock, or even death. What are the causes? The most common cause of this condition is damage to one of the fallopian tubes. A fallopian tube may be narrowed or blocked, and that keeps the fertilized egg from reaching the uterus. What increases the risk? This condition is more likely to develop in women of childbearing age who have different levels of risk. The levels of risk can be divided into three categories. High risk  You have gone through infertility treatment.  You have had an ectopic pregnancy before.  You have had surgery on the fallopian tubes, or another surgical procedure, such as an abortion.  You have had surgery to have the fallopian tubes tied (tubal ligation).  You have problems or diseases of the fallopian tubes.  You have been exposed to diethylstilbestrol (DES). This medicine was used until 1971, and it had effects on babies whose mothers took the medicine.  You become pregnant while using an IUD (intrauterine device) for birth control. Moderate risk  You have a history of infertility.  You have had an STI (sexually transmitted infection).  You have a history of pelvic inflammatory  disease (PID).  You have scarring from endometriosis.  You have multiple sexual partners.  You smoke. Low risk  You have had pelvic surgery.  You use vaginal douches.  You became sexually active before age 18. What are the signs or symptoms? Common symptoms of this condition include normal pregnancy symptoms, such as missing a period, nausea, tiredness, abdominal pain, breast tenderness, and bleeding. However, ectopic pregnancy will have additional symptoms, such as:  Pain with intercourse.  Irregular vaginal bleeding or spotting.  Cramping or pain on one side or in the lower abdomen.  Fast heartbeat, low blood pressure, and sweating.  Passing out while having a bowel movement. Symptoms of a ruptured ectopic pregnancy and internal bleeding may include:  Sudden, severe pain in the abdomen and pelvis.  Dizziness, weakness, light-headedness, or fainting.  Pain in the shoulder or neck area. How is this diagnosed? This condition is diagnosed by:  A pelvic exam to locate pain or a mass in the abdomen.  A pregnancy test. This blood test checks for the presence as well as the specific level of pregnancy hormone in the bloodstream.  Ultrasound. This is performed if a pregnancy test is positive. In this test, a probe is inserted into the vagina. The probe will detect a fetus, possibly in a location other than the uterus.  Taking a sample of uterus tissue (dilation and curettage, or D&C).  Surgery to perform a visual exam of the inside of the abdomen using a thin, lighted tube that has a tiny camera on the end (laparoscope).  Culdocentesis. This procedure involves inserting a needle at the top of   the vagina, behind the uterus. If blood is present in this area, it may indicate that a fallopian tube is torn. How is this treated? This condition is treated with medicine or surgery. Medicine  An injection of a medicine (methotrexate) may be given to cause the pregnancy tissue to be  absorbed. This medicine may save your fallopian tube. It may be given if: ? The diagnosis is made early, with no signs of active bleeding. ? The fallopian tube has not ruptured. ? You are considered to be a good candidate for the medicine. Usually, pregnancy hormone blood levels are checked after methotrexate treatment. This is to be sure that the medicine is effective. It may take 4-6 weeks for the pregnancy to be absorbed. Most pregnancies will be absorbed by 3 weeks. Surgery  A laparoscope may be used to remove the pregnancy tissue.  If severe internal bleeding occurs, a larger cut (incision) may be made in the lower abdomen (laparotomy) to remove the fetus and placenta. This is done to stop the bleeding.  Part or all of the fallopian tube may be removed (salpingectomy) along with the fetus and placenta. The fallopian tube may also be repaired during the surgery.  In very rare circumstances, removal of the uterus (hysterectomy) may be required.  After surgery, pregnancy hormone testing may be done to be sure that there is no pregnancy tissue left. Whether your treatment is medicine or surgery, you may receive a Rho (D) immune globulin shot to prevent problems with any future pregnancy. This shot may be given if:  You are Rh-negative and the baby's father is Rh-positive.  You are Rh-negative and you do not know the Rh type of the baby's father. Follow these instructions at home:  Rest and limit your activity after the procedure for as long as told by your health care provider.  Until your health care provider says that it is safe: ? Do not lift anything that is heavier than 10 lb (4.5 kg), or the limit that your health care provider tells you. ? Avoid physical exercise and any movement that requires effort (is strenuous).  To help prevent constipation: ? Eat a healthy diet that includes fruits, vegetables, and whole grains. ? Drink 6-8 glasses of water per day. Get help right away  if:  You develop worsening pain that is not relieved by medicine.  You have: ? A fever or chills. ? Vaginal bleeding. ? Redness and swelling at the incision site. ? Nausea and vomiting.  You feel dizzy or weak.  You feel light-headed or you faint. This information is not intended to replace advice given to you by your health care provider. Make sure you discuss any questions you have with your health care provider. Document Revised: 08/10/2017 Document Reviewed: 03/29/2016 Elsevier Patient Education  2020 [employer]        First Trimester of Pregnancy The first trimester of pregnancy is from week 1 until the end of week 13 (months 1 through 3). A week after a sperm fertilizes an egg, the egg will implant on the wall of the uterus. This embryo will begin to develop into a baby. Genes from you and your partner will form the baby. The female genes will determine whether the baby will be a boy or a girl. At 6-8 weeks, the eyes and face will be formed, and the heartbeat can be seen on ultrasound. At the end of 12 weeks, all the baby's organs will be formed. Now that you are   pregnant, you will want to do everything you can to have a healthy baby. Two of the most important things are to get good prenatal care and to follow your health care provider's instructions. Prenatal care is all the medical care you receive before the baby's birth. This care will help prevent, find, and treat any problems during the pregnancy and childbirth. Body changes during your first trimester Your body goes through many changes during pregnancy. The changes vary from woman to woman.  You may gain or lose a couple of pounds at first.  You may feel sick to your stomach (nauseous) and you may throw up (vomit). If the vomiting is uncontrollable, call your health care provider.  You may tire easily.  You may develop headaches that can be relieved by medicines. All medicines should be approved by your health care  provider.  You may urinate more often. Painful urination may mean you have a bladder infection.  You may develop heartburn as a result of your pregnancy.  You may develop constipation because certain hormones are causing the muscles that push stool through your intestines to slow down.  You may develop hemorrhoids or swollen veins (varicose veins).  Your breasts may begin to grow larger and become tender. Your nipples may stick out more, and the tissue that surrounds them (areola) may become darker.  Your gums may bleed and may be sensitive to brushing and flossing.  Dark spots or blotches (chloasma, mask of pregnancy) may develop on your face. This will likely fade after the baby is born.  Your menstrual periods will stop.  You may have a loss of appetite.  You may develop cravings for certain kinds of food.  You may have changes in your emotions from day to day, such as being excited to be pregnant or being concerned that something may go wrong with the pregnancy and baby.  You may have more vivid and strange dreams.  You may have changes in your hair. These can include thickening of your hair, rapid growth, and changes in texture. Some women also have hair loss during or after pregnancy, or hair that feels dry or thin. Your hair will most likely return to normal after your baby is born. What to expect at prenatal visits During a routine prenatal visit:  You will be weighed to make sure you and the baby are growing normally.  Your blood pressure will be taken.  Your abdomen will be measured to track your baby's growth.  The fetal heartbeat will be listened to between weeks 10 and 14 of your pregnancy.  Test results from any previous visits will be discussed. Your health care provider may ask you:  How you are feeling.  If you are feeling the baby move.  If you have had any abnormal symptoms, such as leaking fluid, bleeding, severe headaches, or abdominal cramping.  If  you are using any tobacco products, including cigarettes, chewing tobacco, and electronic cigarettes.  If you have any questions. Other tests that may be performed during your first trimester include:  Blood tests to find your blood type and to check for the presence of any previous infections. The tests will also be used to check for low iron levels (anemia) and protein on red blood cells (Rh antibodies). Depending on your risk factors, or if you previously had diabetes during pregnancy, you may have tests to check for high blood sugar that affects pregnant women (gestational diabetes).  Urine tests to check for infections, diabetes,   or protein in the urine.  An ultrasound to confirm the proper growth and development of the baby.  Fetal screens for spinal cord problems (spina bifida) and Down syndrome.  HIV (human immunodeficiency virus) testing. Routine prenatal testing includes screening for HIV, unless you choose not to have this test.  You may need other tests to make sure you and the baby are doing well. Follow these instructions at home: Medicines  Follow your health care provider's instructions regarding medicine use. Specific medicines may be either safe or unsafe to take during pregnancy.  Take a prenatal vitamin that contains at least 600 micrograms (mcg) of folic acid.  If you develop constipation, try taking a stool softener if your health care provider approves. Eating and drinking   Eat a balanced diet that includes fresh fruits and vegetables, whole grains, good sources of protein such as meat, eggs, or tofu, and low-fat dairy. Your health care provider will help you determine the amount of weight gain that is right for you.  Avoid raw meat and uncooked cheese. These carry germs that can cause birth defects in the baby.  Eating four or five small meals rather than three large meals a day may help relieve nausea and vomiting. If you start to feel nauseous, eating a few  soda crackers can be helpful. Drinking liquids between meals, instead of during meals, also seems to help ease nausea and vomiting.  Limit foods that are high in fat and processed sugars, such as fried and sweet foods.  To prevent constipation: ? Eat foods that are high in fiber, such as fresh fruits and vegetables, whole grains, and beans. ? Drink enough fluid to keep your urine clear or pale yellow. Activity  Exercise only as directed by your health care provider. Most women can continue their usual exercise routine during pregnancy. Try to exercise for 30 minutes at least 5 days a week. Exercising will help you: ? Control your weight. ? Stay in shape. ? Be prepared for labor and delivery.  Experiencing pain or cramping in the lower abdomen or lower back is a good sign that you should stop exercising. Check with your health care provider before continuing with normal exercises.  Try to avoid standing for long periods of time. Move your legs often if you must stand in one place for a long time.  Avoid heavy lifting.  Wear low-heeled shoes and practice good posture.  You may continue to have sex unless your health care provider tells you not to. Relieving pain and discomfort  Wear a good support bra to relieve breast tenderness.  Take warm sitz baths to soothe any pain or discomfort caused by hemorrhoids. Use hemorrhoid cream if your health care provider approves.  Rest with your legs elevated if you have leg cramps or low back pain.  If you develop varicose veins in your legs, wear support hose. Elevate your feet for 15 minutes, 3-4 times a day. Limit salt in your diet. Prenatal care  Schedule your prenatal visits by the twelfth week of pregnancy. They are usually scheduled monthly at first, then more often in the last 2 months before delivery.  Write down your questions. Take them to your prenatal visits.  Keep all your prenatal visits as told by your health care provider.  This is important. Safety  Wear your seat belt at all times when driving.  Make a list of emergency phone numbers, including numbers for family, friends, the hospital, and police and fire departments. General   instructions  Ask your health care provider for a referral to a local prenatal education class. Begin classes no later than the beginning of month 6 of your pregnancy.  Ask for help if you have counseling or nutritional needs during pregnancy. Your health care provider can offer advice or refer you to specialists for help with various needs.  Do not use hot tubs, steam rooms, or saunas.  Do not douche or use tampons or scented sanitary pads.  Do not cross your legs for long periods of time.  Avoid cat litter boxes and soil used by cats. These carry germs that can cause birth defects in the baby and possibly loss of the fetus by miscarriage or stillbirth.  Avoid all smoking, herbs, alcohol, and medicines not prescribed by your health care provider. Chemicals in these products affect the formation and growth of the baby.  Do not use any products that contain nicotine or tobacco, such as cigarettes and e-cigarettes. If you need help quitting, ask your health care provider. You may receive counseling support and other resources to help you quit.  Schedule a dentist appointment. At home, brush your teeth with a soft toothbrush and be gentle when you floss. Contact a health care provider if:  You have dizziness.  You have mild pelvic cramps, pelvic pressure, or nagging pain in the abdominal area.  You have persistent nausea, vomiting, or diarrhea.  You have a bad smelling vaginal discharge.  You have pain when you urinate.  You notice increased swelling in your face, hands, legs, or ankles.  You are exposed to fifth disease or chickenpox.  You are exposed to German measles (rubella) and have never had it. Get help right away if:  You have a fever.  You are leaking fluid  from your vagina.  You have spotting or bleeding from your vagina.  You have severe abdominal cramping or pain.  You have rapid weight gain or loss.  You vomit blood or material that looks like coffee grounds.  You develop a severe headache.  You have shortness of breath.  You have any kind of trauma, such as from a fall or a car accident. Summary  The first trimester of pregnancy is from week 1 until the end of week 13 (months 1 through 3).  Your body goes through many changes during pregnancy. The changes vary from woman to woman.  You will have routine prenatal visits. During those visits, your health care provider will examine you, discuss any test results you may have, and talk with you about how you are feeling. This information is not intended to replace advice given to you by your health care provider. Make sure you discuss any questions you have with your health care provider. Document Revised: 08/10/2017 Document Reviewed: 08/09/2016 Elsevier Patient Education  2020 [employer]        Miscarriage A miscarriage is the loss of an unborn baby (fetus) before the 20th week of pregnancy. Most miscarriages happen during the first 3 months of pregnancy. Sometimes, a miscarriage can happen before a woman knows that she is pregnant. Having a miscarriage can be an emotional experience. If you have had a miscarriage, talk with your health care provider about any questions you may have about miscarrying, the grieving process, and your plans for future pregnancy. What are the causes? A miscarriage may be caused by:  Problems with the genes or chromosomes of the fetus. These problems make it impossible for the baby to develop normally. They   are often the result of random errors that occur early in the development of the baby, and are not passed from parent to child (not inherited).  Infection of the cervix or uterus.  Conditions that affect hormone balance in the  body.  Problems with the cervix, such as the cervix opening and thinning before pregnancy is at term (cervical insufficiency).  Problems with the uterus. These may include: ? A uterus with an abnormal shape. ? Fibroids in the uterus. ? Congenital abnormalities. These are problems that were present at birth.  Certain medical conditions.  Smoking, drinking alcohol, or using drugs.  Injury (trauma). In many cases, the cause of a miscarriage is not known. What are the signs or symptoms? Symptoms of this condition include:  Vaginal bleeding or spotting, with or without cramps or pain.  Pain or cramping in the abdomen or lower back.  Passing fluid, tissue, or blood clots from the vagina. How is this diagnosed? This condition may be diagnosed based on:  A physical exam.  Ultrasound.  Blood tests.  Urine tests. How is this treated? Treatment for a miscarriage is sometimes not necessary if you naturally pass all the tissue that was in your uterus. If necessary, this condition may be treated with:  Dilation and curettage (D&C). This is a procedure in which the cervix is stretched open and the lining of the uterus (endometrium) is scraped. This is done only if tissue from the fetus or placenta remains in the body (incomplete miscarriage).  Medicines, such as: ? Antibiotic medicine, to treat infection. ? Medicine to help the body pass any remaining tissue. ? Medicine to reduce (contract) the size of the uterus. These medicines may be given if you have a lot of bleeding. If you have Rh negative blood and your baby was Rh positive, you will need a shot of a medicine called Rh immunoglobulinto protect your future babies from Rh blood problems. "Rh-negative" and "Rh-positive" refer to whether or not the blood has a specific protein found on the surface of red blood cells (Rh factor). Follow these instructions at home: Medicines   Take over-the-counter and prescription medicines only as  told by your health care provider.  If you were prescribed antibiotic medicine, take it as told by your health care provider. Do not stop taking the antibiotic even if you start to feel better.  Do not take NSAIDs, such as aspirin and ibuprofen, unless they are approved by your health care provider. These medicines can cause bleeding. Activity  Rest as directed. Ask your health care provider what activities are safe for you.  Have someone help with home and family responsibilities during this time. General instructions  Keep track of the number of sanitary pads you use each day and how soaked (saturated) they are. Write down this information.  Monitor the amount of tissue or blood clots that you pass from your vagina. Save any large amounts of tissue for your health care provider to examine.  Do not use tampons, douche, or have sex until your health care provider approves.  To help you and your partner with the process of grieving, talk with your health care provider or seek counseling.  When you are ready, meet with your health care provider to discuss any important steps you should take for your health. Also, discuss steps you should take to have a healthy pregnancy in the future.  Keep all follow-up visits as told by your health care provider. This is important. Where to find   more information  The [employer] Gynec[employer]: www.acog.org  [place] Department of Health and Human Services Office of Women's Health: www.womenshealth.gov Contact a health care provider if:  You have a fever or chills.  You have a foul smelling vaginal discharge.  You have more bleeding instead of less. Get help right away if:  You have severe cramps or pain in your back or abdomen.  You pass blood clots or tissue from your vagina that is walnut-sized or larger.  You soak more than 1 regular sanitary pad in an hour.  You become light-headed or weak.  You pass out.  You  have feelings of sadness that take over your thoughts, or you have thoughts of hurting yourself. Summary  Most miscarriages happen in the first 3 months of pregnancy. Sometimes miscarriage happens before a woman even knows that she is pregnant.  Follow your health care provider's instruction for home care. Keep all follow-up appointments.  To help you and your partner with the process of grieving, talk with your health care provider or seek counseling. This information is not intended to replace advice given to you by your health care provider. Make sure you discuss any questions you have with your health care provider. Document Revised: 12/20/2018 Document Reviewed: 10/03/2016 Elsevier Patient Education  2020 [employer]                        Safe Medications in Pregnancy    Acne: Benzoyl Peroxide Salicylic Acid  Backache/Headache: Tylenol: 2 regular strength every 4 hours OR              2 Extra strength every 6 hours  Colds/Coughs/Allergies: Benadryl (alcohol free) 25 mg every 6 hours as needed Breath right strips Claritin Cepacol throat lozenges Chloraseptic throat spray Cold-Eeze- up to three times per day Cough drops, alcohol free Flonase (by prescription only) Guaifenesin Mucinex Robitussin DM (plain only, alcohol free) Saline nasal spray/drops Sudafed (pseudoephedrine) & Actifed ** use only after [redacted] weeks gestation and if you do not have high blood pressure Tylenol Vicks Vaporub Zinc lozenges Zyrtec   Constipation: Colace Ducolax suppositories Fleet enema Glycerin suppositories Metamucil Milk of magnesia Miralax Senokot Smooth move tea  Diarrhea: Kaopectate Imodium A-D  *NO pepto Bismol  Hemorrhoids: Anusol Anusol HC Preparation H Tucks  Indigestion: Tums Maalox Mylanta Zantac  Pepcid  Insomnia: Benadryl (alcohol free) 25mg every 6 hours as needed Tylenol PM Unisom, no Gelcaps  Leg Cramps: Tums MagGel  Nausea/Vomiting:   Bonine Dramamine Emetrol Ginger extract Sea bands Meclizine  Nausea medication to take during pregnancy:  Unisom (doxylamine succinate 25 mg tablets) Take one tablet daily at bedtime. If symptoms are not adequately controlled, the dose can be increased to a maximum recommended dose of two tablets daily (1/2 tablet in the morning, 1/2 tablet mid-afternoon and one at bedtime). Vitamin B6 100mg tablets. Take one tablet twice a day (up to 200 mg per day).  Skin Rashes: Aveeno products Benadryl cream or 25mg every 6 hours as needed Calamine Lotion 1% cortisone cream  Yeast infection: Gyne-lotrimin 7 Monistat 7   **If taking multiple medications, please check labels to avoid duplicating the same active ingredients **take medication as directed on the label ** Do not exceed 4000 mg of tylenol in 24 hours **Do not take medications that contain aspirin or ibuprofen          Prenatal Care Providers             [facility] @ [facility] - accepts patients without insurance  Phone: [phone]  [facility]   Phone: [phone]  [facility]       Phone: [phone]            [facility] [place]     Phone: [phone]          [facility] [place]   Phone: [phone]  [facility] - accepts patients without insurance  Phone: [phone]  [facility] Phone: [phone]     [facility] - accepts patients without insurance Phone: [phone]  [facility]  Phone: [phone]  [facility] Phone: [phone]  [facility] Phone: [phone]  [facility] Phone: [phone]  [facility] Phone: [phone]  [facility]  Phone: [phone]   

## 2020-02-25 LAB — GC/CHLAMYDIA PROBE AMP (~~LOC~~) NOT AT ARMC
Chlamydia: NEGATIVE
Comment: NEGATIVE
Comment: NORMAL
Neisseria Gonorrhea: NEGATIVE

## 2020-02-26 ENCOUNTER — Other Ambulatory Visit: Payer: Medicaid Other

## 2020-03-11 ENCOUNTER — Other Ambulatory Visit: Payer: Self-pay

## 2020-03-11 ENCOUNTER — Encounter (HOSPITAL_COMMUNITY): Payer: Self-pay | Admitting: Obstetrics and Gynecology

## 2020-03-11 ENCOUNTER — Inpatient Hospital Stay (HOSPITAL_COMMUNITY)
Admission: AD | Admit: 2020-03-11 | Discharge: 2020-03-12 | Disposition: A | Discharge status: Home / Self Care | Payer: Medicaid Other | Attending: Obstetrics and Gynecology | Admitting: Obstetrics and Gynecology

## 2020-03-11 DIAGNOSIS — O26891 Other specified pregnancy related conditions, first trimester: Secondary | ICD-10-CM | POA: Diagnosis not present

## 2020-03-11 DIAGNOSIS — O26899 Other specified pregnancy related conditions, unspecified trimester: Secondary | ICD-10-CM

## 2020-03-11 DIAGNOSIS — Z3A01 Less than 8 weeks gestation of pregnancy: Secondary | ICD-10-CM | POA: Diagnosis not present

## 2020-03-11 DIAGNOSIS — Z79899 Other long term (current) drug therapy: Secondary | ICD-10-CM | POA: Diagnosis not present

## 2020-03-11 DIAGNOSIS — R102 Pelvic and perineal pain: Secondary | ICD-10-CM | POA: Insufficient documentation

## 2020-03-11 DIAGNOSIS — O3680X0 Pregnancy with inconclusive fetal viability, not applicable or unspecified: Secondary | ICD-10-CM | POA: Diagnosis not present

## 2020-03-11 DIAGNOSIS — O99511 Diseases of the respiratory system complicating pregnancy, first trimester: Secondary | ICD-10-CM | POA: Diagnosis not present

## 2020-03-11 DIAGNOSIS — J45909 Unspecified asthma, uncomplicated: Secondary | ICD-10-CM | POA: Diagnosis not present

## 2020-03-11 NOTE — MAU Provider Note (Signed)
Chief Complaint: Abdominal Pain   First Provider Initiated Contact with Patient 03/11/20 2321        SUBJECTIVE HPI: Beth Bray is a 24 y.o. G2P1001 at [redacted]w[redacted]d by LMP who presents to maternity admissions reporting pelvic pain for several days.  No bleeding.  Was seen for similar pain on 6/15 and never came back for followup HCG levels.  . She denies vaginal bleeding, vaginal itching/burning, urinary symptoms, h/a, dizziness, n/v, or fever/chills.     Abdominal Pain This is a recurrent problem. The current episode started 1 to 4 weeks ago. The onset quality is gradual. The problem occurs intermittently. The problem has been unchanged. The pain is located in the suprapubic region, LLQ and RLQ. The quality of the pain is cramping. The abdominal pain does not radiate. Pertinent negatives include no constipation, diarrhea, dysuria, fever, nausea or vomiting. Nothing aggravates the pain. The pain is relieved by nothing. She has tried nothing for the symptoms.   RN Note: Having lower abd pain for couple days. Sharp, crampy pain across lower abd that comes and goes. Denies VB. Some clear vag d/c  Past Medical History:  Diagnosis Date  . Anxiety   . Asthma   . Depression    Past Surgical History:  Procedure Laterality Date  . CESAREAN SECTION N/A 08/12/2018   Procedure: CESAREAN SECTION;  Surgeon: Union Bing, MD;  Location: [facility] BIRTHING SUITES;  Service: Obstetrics;  Laterality: N/A;  . NO PAST SURGERIES    . WISDOM TOOTH EXTRACTION     Social History   Socioeconomic History  . Marital status: Single    Spouse name: Not on file  . Number of children: Not on file  . Years of education: Not on file  . Highest education level: Not on file  Occupational History  . Not on file  Tobacco Use  . Smoking status: Passive Smoke Exposure - Never Smoker  . Smokeless tobacco: Never Used  Vaping Use  . Vaping Use: Never used  Substance and Sexual Activity  . Alcohol use: No  . Drug use: No   . Sexual activity: Yes    Birth control/protection: None  Other Topics Concern  . Not on file  Social History Narrative  . Not on file   Social Determinants of Health   Financial Resource Strain:   . Difficulty of Paying Living Expenses:   Food Insecurity:   . Worried About Programme researcher, broadcasting/film/video in the Last Year:   . Barista in the Last Year:   Transportation Needs:   . Freight forwarder (Medical):   Marland Kitchen Lack of Transportation (Non-Medical):   Physical Activity:   . Days of Exercise per Week:   . Minutes of Exercise per Session:   Stress:   . Feeling of Stress :   Social Connections:   . Frequency of Communication with Friends and Family:   . Frequency of Social Gatherings with Friends and Family:   . Attends Religious Services:   . Active Member of Clubs or Organizations:   . Attends Banker Meetings:   Marland Kitchen Marital Status:   Intimate Partner Violence:   . Fear of Current or Ex-Partner:   . Emotionally Abused:   Marland Kitchen Physically Abused:   . Sexually Abused:    No current facility-administered medications on file prior to encounter.   Current Outpatient Medications on File Prior to Encounter  Medication Sig Dispense Refill  . fluticasone (FLONASE) 50 MCG/ACT nasal spray Place into  both nostrils daily.    . Prenatal Vit-Fe Fumarate-FA (MULTIVITAMIN-PRENATAL) 27-0.8 MG TABS tablet Take 1 tablet by mouth daily at 12 noon.    Marland Kitchen albuterol (PROVENTIL HFA;VENTOLIN HFA) 108 (90 BASE) MCG/ACT inhaler Inhale 3 puffs into the lungs every 4 (four) hours as needed for wheezing. (Patient not taking: Reported on 09/17/2018) 1 Inhaler 0  . cyclobenzaprine (FLEXERIL) 5 MG tablet Take 1 tablet (5 mg total) by mouth every 8 (eight) hours as needed for muscle spasms. (Patient not taking: Reported on 09/17/2018) 30 tablet 0  . polyethylene glycol (MIRALAX / GLYCOLAX) packet Take 17 g by mouth daily. (Patient not taking: Reported on 09/17/2018) 14 each 0  . simethicone (GAS-X) 80 MG  chewable tablet Chew 1 tablet (80 mg total) by mouth every 6 (six) hours as needed for flatulence. (Patient not taking: Reported on 09/17/2018) 30 tablet 0   Allergies  Allergen Reactions  . Kiwi Extract Swelling  . Pineapple Other (See Comments)    Bumps and sores in mouth    I have reviewed patient's Past Medical Hx, Surgical Hx, Family Hx, Social Hx, medications and allergies.   ROS:  Review of Systems  Constitutional: Negative for fever.  Gastrointestinal: Positive for abdominal pain. Negative for constipation, diarrhea, nausea and vomiting.  Genitourinary: Negative for dysuria.   Review of Systems  Other systems negative   Physical Exam  Physical Exam Patient Vitals for the past 24 hrs:  BP Temp Pulse Resp Height Weight  03/11/20 2310 (!) 106/51 (!) 97.5 F (36.4 C) 77 18 5\' 3"  (1.6 m) 69.4 kg   Constitutional: Well-developed, well-nourished female in no acute distress.  Cardiovascular: normal rate Respiratory: normal effort GI: Abd soft, non-tender. Pos BS x 4 MS: Extremities nontender, no edema, normal ROM Neurologic: Alert and oriented x 4.  GU: Neg CVAT.  PELVIC EXAM:  Bimanual exam: Cervix 0/long/high, firm, anterior, neg CMT, uterus nontender, nonenlarged, adnexa without tenderness, enlargement, or mass   LAB RESULTS Results for orders placed or performed during the hospital encounter of 03/11/20 (from the past 24 hour(s))  Urinalysis, Routine w reflex microscopic     Status: Abnormal   Collection Time: 03/11/20 11:13 PM  Result Value Ref Range   Color, Urine YELLOW YELLOW   APPearance CLOUDY (A) CLEAR   Specific Gravity, Urine 1.017 1.005 - 1.030   pH 8.0 5.0 - 8.0   Glucose, UA NEGATIVE NEGATIVE mg/dL   Hgb urine dipstick NEGATIVE NEGATIVE   Bilirubin Urine NEGATIVE NEGATIVE   Ketones, ur NEGATIVE NEGATIVE mg/dL   Protein, ur NEGATIVE NEGATIVE mg/dL   Nitrite NEGATIVE NEGATIVE   Leukocytes,Ua NEGATIVE NEGATIVE  hCG, quantitative, pregnancy      Status: Abnormal   Collection Time: 03/11/20 11:29 PM  Result Value Ref Range   hCG, Beta Chain, Quant, S 78,243 (H) <5 mIU/mL     IMAGING 05/12/20 OB Transvaginal  Result Date: 03/12/2020 CLINICAL DATA:  Pain EXAM: TRANSVAGINAL OB ULTRASOUND TECHNIQUE: Transvaginal ultrasound was performed for complete evaluation of the gestation as well as the maternal uterus, adnexal regions, and pelvic cul-de-sac. COMPARISON:  Obstetrical ultrasound 02/24/2020 FINDINGS: Intrauterine gestational sac: Single Yolk sac:  Visualized. Embryo:  Visualized. Cardiac Activity: Visualized. Heart Rate: 100 bpm CRL:   4.6 mm   6 w 1 d                  02/26/2020 EDC: 11/04/2020 Subchorionic hemorrhage:  None visualized. Maternal uterus/adnexae: Normal anteverted appearance of the maternal uterus. Normal appearance of  the ovaries. No pelvic free fluid. IMPRESSION: Single intrauterine gestation with a gestational age of [redacted] weeks, 1 day by crown-rump length sonographic estimation. Fetal heart rate of 100 beats per minute is concerning for fetal bradycardia. Consider obstetrical consultation. No other acute or worrisome findings. Electronically Signed   By: Kreg Shropshire M.D.   On: 03/12/2020 00:48     MAU Management/MDM: Ordered followup Ultrasound to rule out ectopic. [place] confirms single IUP with borderline low FHR per radiologist.   This bleeding/pain can represent a normal pregnancy with bleeding, spontaneous abortion or even an ectopic which can be life-threatening.  The process as listed above helps to determine which of these is present.    ASSESSMENT 1. Pregnancy of unknown anatomic location   2. Pelvic pain affecting pregnancy   3. Pregnancy of unknown anatomic location   4. Pelvic pain affecting pregnancy     PLAN Discharge home Encouraged to start prenatal care soon States has an appointment in August.  Pt stable at time of discharge. Encouraged to return here or to other Urgent Care/ED if she develops worsening of  symptoms, increase in pain, fever, or other concerning symptoms.    Wynelle Bourgeois CNM, MSN Certified Nurse-Midwife 03/11/2020  11:21 PM

## 2020-03-11 NOTE — MAU Note (Signed)
Having lower abd pain for couple days. Sharp, crampy pain across lower abd that comes and goes. Denies VB. Some clear vag d/c

## 2020-03-12 ENCOUNTER — Inpatient Hospital Stay (HOSPITAL_COMMUNITY): Payer: Medicaid Other

## 2020-03-12 DIAGNOSIS — O3680X0 Pregnancy with inconclusive fetal viability, not applicable or unspecified: Secondary | ICD-10-CM

## 2020-03-12 LAB — URINALYSIS, ROUTINE W REFLEX MICROSCOPIC
Bilirubin Urine: NEGATIVE
Glucose, UA: NEGATIVE mg/dL
Hgb urine dipstick: NEGATIVE
Ketones, ur: NEGATIVE mg/dL
Leukocytes,Ua: NEGATIVE
Nitrite: NEGATIVE
Protein, ur: NEGATIVE mg/dL
Specific Gravity, Urine: 1.017 (ref 1.005–1.030)
pH: 8 (ref 5.0–8.0)

## 2020-03-12 LAB — HCG, QUANTITATIVE, PREGNANCY: hCG, Beta Chain, Quant, S: 78243 m[IU]/mL — ABNORMAL HIGH (ref <5)

## 2020-03-12 NOTE — Discharge Instructions (Signed)
First Trimester of Pregnancy The first trimester of pregnancy is from week 1 until the end of week 13 (months 1 through 3). A week after a sperm fertilizes an egg, the egg will implant on the wall of the uterus. This embryo will begin to develop into a baby. Genes from you and your partner will form the baby. The female genes will determine whether the baby will be a boy or a girl. At 6-8 weeks, the eyes and face will be formed, and the heartbeat can be seen on ultrasound. At the end of 12 weeks, all the baby's organs will be formed. Now that you are pregnant, you will want to do everything you can to have a healthy baby. Two of the most important things are to get good prenatal care and to follow your health care provider's instructions. Prenatal care is all the medical care you receive before the baby's birth. This care will help prevent, find, and treat any problems during the pregnancy and childbirth. Body changes during your first trimester Your body goes through many changes during pregnancy. The changes vary from woman to woman.  You may gain or lose a couple of pounds at first.  You may feel sick to your stomach (nauseous) and you may throw up (vomit). If the vomiting is uncontrollable, call your health care provider.  You may tire easily.  You may develop headaches that can be relieved by medicines. All medicines should be approved by your health care provider.  You may urinate more often. Painful urination may mean you have a bladder infection.  You may develop heartburn as a result of your pregnancy.  You may develop constipation because certain hormones are causing the muscles that push stool through your intestines to slow down.  You may develop hemorrhoids or swollen veins (varicose veins).  Your breasts may begin to grow larger and become tender. Your nipples may stick out more, and the tissue that surrounds them (areola) may become darker.  Your gums may bleed and may be  sensitive to brushing and flossing.  Dark spots or blotches (chloasma, mask of pregnancy) may develop on your face. This will likely fade after the baby is born.  Your menstrual periods will stop.  You may have a loss of appetite.  You may develop cravings for certain kinds of food.  You may have changes in your emotions from day to day, such as being excited to be pregnant or being concerned that something may go wrong with the pregnancy and baby.  You may have more vivid and strange dreams.  You may have changes in your hair. These can include thickening of your hair, rapid growth, and changes in texture. Some women also have hair loss during or after pregnancy, or hair that feels dry or thin. Your hair will most likely return to normal after your baby is born. What to expect at prenatal visits During a routine prenatal visit:  You will be weighed to make sure you and the baby are growing normally.  Your blood pressure will be taken.  Your abdomen will be measured to track your baby's growth.  The fetal heartbeat will be listened to between weeks 10 and 14 of your pregnancy.  Test results from any previous visits will be discussed. Your health care provider may ask you:  How you are feeling.  If you are feeling the baby move.  If you have had any abnormal symptoms, such as leaking fluid, bleeding, severe headaches, or abdominal   cramping.  If you are using any tobacco products, including cigarettes, chewing tobacco, and electronic cigarettes.  If you have any questions. Other tests that may be performed during your first trimester include:  Blood tests to find your blood type and to check for the presence of any previous infections. The tests will also be used to check for low iron levels (anemia) and protein on red blood cells (Rh antibodies). Depending on your risk factors, or if you previously had diabetes during pregnancy, you may have tests to check for high blood sugar  that affects pregnant women (gestational diabetes).  Urine tests to check for infections, diabetes, or protein in the urine.  An ultrasound to confirm the proper growth and development of the baby.  Fetal screens for spinal cord problems (spina bifida) and Down syndrome.  HIV (human immunodeficiency virus) testing. Routine prenatal testing includes screening for HIV, unless you choose not to have this test.  You may need other tests to make sure you and the baby are doing well. Follow these instructions at home: Medicines  Follow your health care provider's instructions regarding medicine use. Specific medicines may be either safe or unsafe to take during pregnancy.  Take a prenatal vitamin that contains at least 600 micrograms (mcg) of folic acid.  If you develop constipation, try taking a stool softener if your health care provider approves. Eating and drinking   Eat a balanced diet that includes fresh fruits and vegetables, whole grains, good sources of protein such as meat, eggs, or tofu, and low-fat dairy. Your health care provider will help you determine the amount of weight gain that is right for you.  Avoid raw meat and uncooked cheese. These carry germs that can cause birth defects in the baby.  Eating four or five small meals rather than three large meals a day may help relieve nausea and vomiting. If you start to feel nauseous, eating a few soda crackers can be helpful. Drinking liquids between meals, instead of during meals, also seems to help ease nausea and vomiting.  Limit foods that are high in fat and processed sugars, such as fried and sweet foods.  To prevent constipation: ? Eat foods that are high in fiber, such as fresh fruits and vegetables, whole grains, and beans. ? Drink enough fluid to keep your urine clear or pale yellow. Activity  Exercise only as directed by your health care provider. Most women can continue their usual exercise routine during  pregnancy. Try to exercise for 30 minutes at least 5 days a week. Exercising will help you: ? Control your weight. ? Stay in shape. ? Be prepared for labor and delivery.  Experiencing pain or cramping in the lower abdomen or lower back is a good sign that you should stop exercising. Check with your health care provider before continuing with normal exercises.  Try to avoid standing for long periods of time. Move your legs often if you must stand in one place for a long time.  Avoid heavy lifting.  Wear low-heeled shoes and practice good posture.  You may continue to have sex unless your health care provider tells you not to. Relieving pain and discomfort  Wear a good support bra to relieve breast tenderness.  Take warm sitz baths to soothe any pain or discomfort caused by hemorrhoids. Use hemorrhoid cream if your health care provider approves.  Rest with your legs elevated if you have leg cramps or low back pain.  If you develop varicose veins in   your legs, wear support hose. Elevate your feet for 15 minutes, 3-4 times a day. Limit salt in your diet. Prenatal care  Schedule your prenatal visits by the twelfth week of pregnancy. They are usually scheduled monthly at first, then more often in the last 2 months before delivery.  Write down your questions. Take them to your prenatal visits.  Keep all your prenatal visits as told by your health care provider. This is important. Safety  Wear your seat belt at all times when driving.  Make a list of emergency phone numbers, including numbers for family, friends, the hospital, and police and fire departments. General instructions  Ask your health care provider for a referral to a local prenatal education class. Begin classes no later than the beginning of month 6 of your pregnancy.  Ask for help if you have counseling or nutritional needs during pregnancy. Your health care provider can offer advice or refer you to specialists for help  with various needs.  Do not use hot tubs, steam rooms, or saunas.  Do not douche or use tampons or scented sanitary pads.  Do not cross your legs for long periods of time.  Avoid cat litter boxes and soil used by cats. These carry germs that can cause birth defects in the baby and possibly loss of the fetus by miscarriage or stillbirth.  Avoid all smoking, herbs, alcohol, and medicines not prescribed by your health care provider. Chemicals in these products affect the formation and growth of the baby.  Do not use any products that contain nicotine or tobacco, such as cigarettes and e-cigarettes. If you need help quitting, ask your health care provider. You may receive counseling support and other resources to help you quit.  Schedule a dentist appointment. At home, brush your teeth with a soft toothbrush and be gentle when you floss. Contact a health care provider if:  You have dizziness.  You have mild pelvic cramps, pelvic pressure, or nagging pain in the abdominal area.  You have persistent nausea, vomiting, or diarrhea.  You have a bad smelling vaginal discharge.  You have pain when you urinate.  You notice increased swelling in your face, hands, legs, or ankles.  You are exposed to fifth disease or chickenpox.  You are exposed to German measles (rubella) and have never had it. Get help right away if:  You have a fever.  You are leaking fluid from your vagina.  You have spotting or bleeding from your vagina.  You have severe abdominal cramping or pain.  You have rapid weight gain or loss.  You vomit blood or material that looks like coffee grounds.  You develop a severe headache.  You have shortness of breath.  You have any kind of trauma, such as from a fall or a car accident. Summary  The first trimester of pregnancy is from week 1 until the end of week 13 (months 1 through 3).  Your body goes through many changes during pregnancy. The changes vary from  woman to woman.  You will have routine prenatal visits. During those visits, your health care provider will examine you, discuss any test results you may have, and talk with you about how you are feeling. This information is not intended to replace advice given to you by your health care provider. Make sure you discuss any questions you have with your health care provider. Document Revised: 08/10/2017 Document Reviewed: 08/09/2016 Elsevier Patient Education  2020 [employer]  

## 2020-03-31 LAB — OB RESULTS CONSOLE ABO/RH: RH Type: POSITIVE

## 2020-03-31 LAB — OB RESULTS CONSOLE ANTIBODY SCREEN: Antibody Screen: NEGATIVE

## 2020-04-07 LAB — OB RESULTS CONSOLE RUBELLA ANTIBODY, IGM: Rubella: IMMUNE

## 2020-04-07 LAB — OB RESULTS CONSOLE GC/CHLAMYDIA
Chlamydia: NEGATIVE
Gonorrhea: NEGATIVE

## 2020-04-07 LAB — OB RESULTS CONSOLE HIV ANTIBODY (ROUTINE TESTING): HIV: NONREACTIVE

## 2020-04-07 LAB — OB RESULTS CONSOLE HEPATITIS B SURFACE ANTIGEN: Hepatitis B Surface Ag: NEGATIVE

## 2020-07-06 ENCOUNTER — Inpatient Hospital Stay (HOSPITAL_BASED_OUTPATIENT_CLINIC_OR_DEPARTMENT_OTHER): Payer: Medicaid Other

## 2020-07-06 ENCOUNTER — Encounter (HOSPITAL_COMMUNITY): Payer: Self-pay

## 2020-07-06 ENCOUNTER — Inpatient Hospital Stay (HOSPITAL_COMMUNITY)
Admission: EM | Admit: 2020-07-06 | Discharge: 2020-07-06 | Disposition: A | Discharge status: Home / Self Care | Payer: Medicaid Other | Attending: Obstetrics & Gynecology | Admitting: Obstetrics & Gynecology

## 2020-07-06 ENCOUNTER — Other Ambulatory Visit: Payer: Self-pay

## 2020-07-06 DIAGNOSIS — O26892 Other specified pregnancy related conditions, second trimester: Secondary | ICD-10-CM | POA: Insufficient documentation

## 2020-07-06 DIAGNOSIS — Z3A23 23 weeks gestation of pregnancy: Secondary | ICD-10-CM

## 2020-07-06 DIAGNOSIS — Z3A22 22 weeks gestation of pregnancy: Secondary | ICD-10-CM | POA: Diagnosis not present

## 2020-07-06 DIAGNOSIS — Z7722 Contact with and (suspected) exposure to environmental tobacco smoke (acute) (chronic): Secondary | ICD-10-CM | POA: Insufficient documentation

## 2020-07-06 DIAGNOSIS — R109 Unspecified abdominal pain: Secondary | ICD-10-CM

## 2020-07-06 DIAGNOSIS — R82998 Other abnormal findings in urine: Secondary | ICD-10-CM

## 2020-07-06 DIAGNOSIS — Z20822 Contact with and (suspected) exposure to covid-19: Secondary | ICD-10-CM | POA: Diagnosis not present

## 2020-07-06 DIAGNOSIS — J45909 Unspecified asthma, uncomplicated: Secondary | ICD-10-CM | POA: Insufficient documentation

## 2020-07-06 DIAGNOSIS — O212 Late vomiting of pregnancy: Secondary | ICD-10-CM | POA: Diagnosis not present

## 2020-07-06 DIAGNOSIS — R1084 Generalized abdominal pain: Secondary | ICD-10-CM | POA: Diagnosis not present

## 2020-07-06 DIAGNOSIS — O219 Vomiting of pregnancy, unspecified: Secondary | ICD-10-CM

## 2020-07-06 DIAGNOSIS — R110 Nausea: Secondary | ICD-10-CM | POA: Diagnosis not present

## 2020-07-06 LAB — RAPID URINE DRUG SCREEN, HOSP PERFORMED
Amphetamines: NOT DETECTED
Barbiturates: NOT DETECTED
Benzodiazepines: NOT DETECTED
Cocaine: NOT DETECTED
Opiates: NOT DETECTED
Tetrahydrocannabinol: POSITIVE — AB

## 2020-07-06 LAB — URINALYSIS, ROUTINE W REFLEX MICROSCOPIC
Bacteria, UA: NONE SEEN
Bilirubin Urine: NEGATIVE
Glucose, UA: NEGATIVE mg/dL
Hgb urine dipstick: NEGATIVE
Ketones, ur: 5 mg/dL — AB
Leukocytes,Ua: NEGATIVE
Nitrite: NEGATIVE
Protein, ur: 100 mg/dL — AB
RBC / HPF: >50 RBC/hpf — ABNORMAL HIGH (ref 0–5)
Specific Gravity, Urine: 1.021 (ref 1.005–1.030)
pH: 9 — ABNORMAL HIGH (ref 5.0–8.0)

## 2020-07-06 LAB — CBC
HCT: 33.7 % — ABNORMAL LOW (ref 36.0–46.0)
Hemoglobin: 11.1 g/dL — ABNORMAL LOW (ref 12.0–15.0)
MCH: 27.1 pg (ref 26.0–34.0)
MCHC: 32.9 g/dL (ref 30.0–36.0)
MCV: 82.2 fL (ref 80.0–100.0)
Platelets: 262 10*3/uL (ref 150–400)
RBC: 4.1 MIL/uL (ref 3.87–5.11)
RDW: 13.3 % (ref 11.5–15.5)
WBC: 8.9 10*3/uL (ref 4.0–10.5)
nRBC: 0 % (ref 0.0–0.2)

## 2020-07-06 LAB — RESPIRATORY PANEL BY RT PCR (FLU A&B, COVID)
Influenza A by PCR: NEGATIVE
Influenza B by PCR: NEGATIVE
SARS Coronavirus 2 by RT PCR: NEGATIVE

## 2020-07-06 LAB — COMPREHENSIVE METABOLIC PANEL
ALT: 11 U/L (ref 0–44)
AST: 17 U/L (ref 15–41)
Albumin: 3.4 g/dL — ABNORMAL LOW (ref 3.5–5.0)
Alkaline Phosphatase: 50 U/L (ref 38–126)
Anion gap: 14 (ref 5–15)
BUN: 6 mg/dL (ref 6–20)
CO2: 20 mmol/L — ABNORMAL LOW (ref 22–32)
Calcium: 8.9 mg/dL (ref 8.9–10.3)
Chloride: 106 mmol/L (ref 98–111)
Creatinine, Ser: 0.7 mg/dL (ref 0.44–1.00)
GFR, Estimated: >60 mL/min (ref >60)
Glucose, Bld: 97 mg/dL (ref 70–99)
Potassium: 3.6 mmol/L (ref 3.5–5.1)
Sodium: 140 mmol/L (ref 135–145)
Total Bilirubin: 0.2 mg/dL — ABNORMAL LOW (ref 0.3–1.2)
Total Protein: 7.1 g/dL (ref 6.5–8.1)

## 2020-07-06 MED ORDER — SODIUM CHLORIDE 0.9 % IV BOLUS
1000.0000 mL | Freq: Once | INTRAVENOUS | Status: AC
  Administered 2020-07-06: 1000 mL via INTRAVENOUS

## 2020-07-06 MED ORDER — PROMETHAZINE HCL 25 MG/ML IJ SOLN
25.0000 mg | Freq: Once | INTRAMUSCULAR | Status: AC
  Administered 2020-07-06: 25 mg via INTRAVENOUS
  Filled 2020-07-06: qty 1

## 2020-07-06 NOTE — ED Triage Notes (Signed)
Pt presents with c/o abdominal pain. Pt reports she is approx 6 months pregnant, unsure of last known period. Pt reports around 4 am she woke up with abdominal pain and vomiting.

## 2020-07-06 NOTE — MAU Provider Note (Signed)
History     CSN: [id number]  Arrival date and time: 07/06/20 5361   First Provider Initiated Contact with Patient 07/06/20 0847      Chief Complaint  Patient presents with  . Abdominal Pain  . Pregnant   HPI Beth Bray is a 24 y.o. G2P1001 at [redacted]w[redacted]d who presents to MAU from [facility] with chief complaint of sharp generalized abdominal pain as well as acute nausea and vomiting. These are new problems, onset around 0400 today. She has not vomited in about two hours and denies pain on arrival to MAU.  She denies abdominal tenderness, vaginal bleeding, dysuria, fever or recent illness.  Patient receives care with [facility]. She reports normal fetal anatomy scan in early October.  OB History    Gravida  2   Para  1   Term  1   Preterm  0   AB  0   Living  1     SAB  0   TAB  0   Ectopic  0   Multiple  0   Live Births  1           Past Medical History:  Diagnosis Date  . Anxiety   . Asthma   . Depression     Past Surgical History:  Procedure Laterality Date  . CESAREAN SECTION N/A 08/12/2018   Procedure: CESAREAN SECTION;  Surgeon: [place] Bing, MD;  Location: [facility] BIRTHING SUITES;  Service: Obstetrics;  Laterality: N/A;  . NO PAST SURGERIES    . WISDOM TOOTH EXTRACTION      Family History  Problem Relation Age of Onset  . Hypertension Mother   . Heart disease Mother   . Thyroid disease Mother     Social History   Tobacco Use  . Smoking status: Passive Smoke Exposure - Never Smoker  . Smokeless tobacco: Never Used  Vaping Use  . Vaping Use: Never used  Substance Use Topics  . Alcohol use: No  . Drug use: No    Allergies:  Allergies  Allergen Reactions  . Kiwi Extract Swelling  . Pineapple Other (See Comments)    Bumps and sores in mouth    Medications Prior to Admission  Medication Sig Dispense Refill Last Dose  . albuterol (PROVENTIL HFA;VENTOLIN HFA) 108 (90 BASE) MCG/ACT inhaler Inhale 3 puffs into the lungs every 4 (four) hours as  needed for wheezing. (Patient not taking: Reported on 09/17/2018) 1 Inhaler 0   . cyclobenzaprine (FLEXERIL) 5 MG tablet Take 1 tablet (5 mg total) by mouth every 8 (eight) hours as needed for muscle spasms. (Patient not taking: Reported on 09/17/2018) 30 tablet 0   . fluticasone (FLONASE) 50 MCG/ACT nasal spray Place into both nostrils daily.     . polyethylene glycol (MIRALAX / GLYCOLAX) packet Take 17 g by mouth daily. (Patient not taking: Reported on 09/17/2018) 14 each 0   . Prenatal Vit-Fe Fumarate-FA (MULTIVITAMIN-PRENATAL) 27-0.8 MG TABS tablet Take 1 tablet by mouth daily at 12 noon.     . simethicone (GAS-X) 80 MG chewable tablet Chew 1 tablet (80 mg total) by mouth every 6 (six) hours as needed for flatulence. (Patient not taking: Reported on 09/17/2018) 30 tablet 0     Review of Systems  Gastrointestinal: Positive for abdominal pain, nausea and vomiting.  All other systems reviewed and are negative.  Physical Exam   Blood pressure 117/80, pulse 88, temperature 97.9 F (36.6 C), temperature source Oral, resp. rate 18, height 5\' 3"  (1.6 m), weight  68.5 kg, last menstrual period 01/21/2020, SpO2 100 %, unknown if currently breastfeeding.  Physical Exam Vitals and nursing note reviewed. Exam conducted with a chaperone present.  Constitutional:      General: She is not in acute distress.    Appearance: She is not ill-appearing, toxic-appearing or diaphoretic.  Cardiovascular:     Rate and Rhythm: Normal rate.  Pulmonary:     Effort: Pulmonary effort is normal.     Breath sounds: Normal breath sounds.  Abdominal:     General: Bowel sounds are normal.     Palpations: Abdomen is soft.     Tenderness: There is no abdominal tenderness. There is no right CVA tenderness or left CVA tenderness.     Comments: Gravid  Skin:    General: Skin is warm and dry.     Capillary Refill: Capillary refill takes less than 2 seconds.  Neurological:     General: No focal deficit present.     Mental  Status: She is alert.  Psychiatric:        Mood and Affect: Mood normal.        Behavior: Behavior normal.     MAU Course  Procedures  --Prenatal records from [facility] not visible in EMR --Accepting physician Dr. Mora Appl  Patient Vitals for the past 24 hrs:  BP Temp Temp src Pulse Resp SpO2 Height Weight  07/06/20 1043 (!) 112/56 -- -- 97 -- -- -- --  07/06/20 0803 117/80 -- -- 88 18 100 % -- --  07/06/20 0715 112/80 97.9 F (36.6 C) Oral 92 18 100 % -- --  07/06/20 0714 -- -- -- -- -- -- 5\' 3"  (1.6 m) 68.5 kg   Results for orders placed or performed during the hospital encounter of 07/06/20 (from the past 24 hour(s))  CBC     Status: Abnormal   Collection Time: 07/06/20  7:45 AM  Result Value Ref Range   WBC 8.9 4.0 - 10.5 K/uL   RBC 4.10 3.87 - 5.11 MIL/uL   Hemoglobin 11.1 (L) 12.0 - 15.0 g/dL   HCT 07/08/20 (L) 36 - 46 %   MCV 82.2 80.0 - 100.0 fL   MCH 27.1 26.0 - 34.0 pg   MCHC 32.9 30.0 - 36.0 g/dL   RDW 96.7 89.3 - 81.0 %   Platelets 262 150 - 400 K/uL   nRBC 0.0 0.0 - 0.2 %  Comprehensive metabolic panel     Status: Abnormal   Collection Time: 07/06/20  7:45 AM  Result Value Ref Range   Sodium 140 135 - 145 mmol/L   Potassium 3.6 3.5 - 5.1 mmol/L   Chloride 106 98 - 111 mmol/L   CO2 20 (L) 22 - 32 mmol/L   Glucose, Bld 97 70 - 99 mg/dL   BUN 6 6 - 20 mg/dL   Creatinine, Ser 07/08/20 0.44 - 1.00 mg/dL   Calcium 8.9 8.9 - 1.02 mg/dL   Total Protein 7.1 6.5 - 8.1 g/dL   Albumin 3.4 (L) 3.5 - 5.0 g/dL   AST 17 15 - 41 U/L   ALT 11 0 - 44 U/L   Alkaline Phosphatase 50 38 - 126 U/L   Total Bilirubin 0.2 (L) 0.3 - 1.2 mg/dL   GFR, Estimated 58.5 >27 mL/min   Anion gap 14 5 - 15  Respiratory Panel by RT PCR (Flu A&B, Covid) - Nasopharyngeal Swab     Status: None   Collection Time: 07/06/20  8:57 AM   Specimen: Nasopharyngeal  Swab  Result Value Ref Range   SARS Coronavirus 2 by RT PCR NEGATIVE NEGATIVE   Influenza A by PCR NEGATIVE NEGATIVE   Influenza B by PCR NEGATIVE  NEGATIVE  Rapid urine drug screen (hospital performed)     Status: Abnormal   Collection Time: 07/06/20  9:03 AM  Result Value Ref Range   Opiates NONE DETECTED NONE DETECTED   Cocaine NONE DETECTED NONE DETECTED   Benzodiazepines NONE DETECTED NONE DETECTED   Amphetamines NONE DETECTED NONE DETECTED   Tetrahydrocannabinol POSITIVE (A) NONE DETECTED   Barbiturates NONE DETECTED NONE DETECTED  Urinalysis, Routine w reflex microscopic Urine, Clean Catch     Status: Abnormal   Collection Time: 07/06/20  9:05 AM  Result Value Ref Range   Color, Urine YELLOW YELLOW   APPearance HAZY (A) CLEAR   Specific Gravity, Urine 1.021 1.005 - 1.030   pH 9.0 (H) 5.0 - 8.0   Glucose, UA NEGATIVE NEGATIVE mg/dL   Hgb urine dipstick NEGATIVE NEGATIVE   Bilirubin Urine NEGATIVE NEGATIVE   Ketones, ur 5 (A) NEGATIVE mg/dL   Protein, ur 626 (A) NEGATIVE mg/dL   Nitrite NEGATIVE NEGATIVE   Leukocytes,Ua NEGATIVE NEGATIVE   RBC / HPF >50 (H) 0 - 5 RBC/hpf   WBC, UA 0-5 0 - 5 WBC/hpf   Bacteria, UA NONE SEEN NONE SEEN   Squamous Epithelial / LPF 11-20 0 - 5   Mucus PRESENT    Meds ordered this encounter  Medications  . sodium chloride 0.9 % bolus 1,000 mL  . promethazine (PHENERGAN) injection 25 mg    Assessment and Plan  --24 y.o. G2P1001 at [redacted]w[redacted]d  --FHT 145 (per paper tracing from [facility]OB at [facility]) --Asymptomatic in MAU --No concerning findings on [place] MFM OB Limited --+ THC, abstinence advised --Abnormal UA, urine culture in work --Tolerating PO prior to discharge --Discharge home in stable condition  F/U: --[facility], next appointment is next week  Calvert Cantor, CNM 07/06/2020, 1:36 PM

## 2020-07-06 NOTE — Discharge Instructions (Signed)
Abdominal Pain During Pregnancy  Belly (abdominal) pain is common during pregnancy. There are many possible causes. Most of the time, it is not a serious problem. Other times, it can be a sign that something is wrong with the pregnancy. Always tell your doctor if you have belly pain. Follow these instructions at home:  Do not have sex or put anything in your vagina until your pain goes away completely.  Get plenty of rest until your pain gets better.  Drink enough fluid to keep your pee (urine) pale yellow.  Take over-the-counter and prescription medicines only as told by your doctor.  Keep all follow-up visits as told by your doctor. This is important. Contact a doctor if:  Your pain continues or gets worse after resting.  You have lower belly pain that: ? Comes and goes at regular times. ? Spreads to your back. ? Feels like menstrual cramps.  You have pain or burning when you pee (urinate). Get help right away if:  You have a fever or chills.  You have vaginal bleeding.  You are leaking fluid from your vagina.  You are passing tissue from your vagina.  You throw up (vomit) for more than 24 hours.  You have watery poop (diarrhea) for more than 24 hours.  Your baby is moving less than usual.  You feel very weak or faint.  You have shortness of breath.  You have very bad pain in your upper belly. Summary  Belly (abdominal) pain is common during pregnancy. There are many possible causes.  If you have belly pain during pregnancy, tell your doctor right away.  Keep all follow-up visits as told by your doctor. This is important. This information is not intended to replace advice given to you by your health care provider. Make sure you discuss any questions you have with your health care provider. Document Revised: 12/16/2018 Document Reviewed: 11/30/2016 Elsevier Patient Education  2020 [employer]vier Inc.  

## 2020-07-06 NOTE — Progress Notes (Signed)
Report called to MAU  

## 2020-07-06 NOTE — ED Provider Notes (Signed)
[facility]-EMERGENCY DEPT Provider Note   CSN: [id number] Arrival date & time: 07/06/20  2025     History Chief Complaint  Patient presents with   Abdominal Pain   Pregnant    Beth Bray is a 24 y.o. female.  HPI Patient presents with abdominal pain.  She is approximately [redacted] weeks pregnant.  Get seen at [facility].  Last had an ultrasound earlier this month.  States that around 4 this morning she woke up with constant abdominal pain over entire abdomen but mostly lower abdomen.  Has had some nausea and vomiting.  No vaginal bleeding or change in the vaginal discharge she was having.  No rush of fluid.  Pain is constant and not crampy.  This is her second pregnancy.  First pregnancy she did not labor because of fetal distress.  Had C-section.  Has been doing well the last few days.  Upon arrival to the ER had the feeling she had to go to the bathroom and did know she is going to urinate or have a bowel movement.  Patient still feels baby's movement.    Past Medical History:  Diagnosis Date   Anxiety    Asthma    Depression     Patient Active Problem List   Diagnosis Date Noted   Pregnancy of unknown anatomic location 03/12/2020   History of cesarean delivery due to arrest of dilation 09/17/2018   Postoperative anemia 08/15/2018   Constipation in female 02/07/2018    Past Surgical History:  Procedure Laterality Date   CESAREAN SECTION N/A 08/12/2018   Procedure: CESAREAN SECTION;  Surgeon: [place] Bing, MD;  Location: [facility] BIRTHING SUITES;  Service: Obstetrics;  Laterality: N/A;   NO PAST SURGERIES     WISDOM TOOTH EXTRACTION       OB History    Gravida  2   Para  1   Term  1   Preterm  0   AB  0   Living  1     SAB  0   TAB  0   Ectopic  0   Multiple  0   Live Births  1           Family History  Problem Relation Age of Onset   Hypertension Mother    Heart disease Mother    Thyroid disease  Mother     Social History   Tobacco Use   Smoking status: Passive Smoke Exposure - Never Smoker   Smokeless tobacco: Never Used  Vaping Use   Vaping Use: Never used  Substance Use Topics   Alcohol use: No   Drug use: No    Home Medications Prior to Admission medications   Medication Sig Start Date End Date Taking? Authorizing Provider  albuterol (PROVENTIL HFA;VENTOLIN HFA) 108 (90 BASE) MCG/ACT inhaler Inhale 3 puffs into the lungs every 4 (four) hours as needed for wheezing. Patient not taking: Reported on 09/17/2018 06/05/12   Marcellina Millin, MD  cyclobenzaprine (FLEXERIL) 5 MG tablet Take 1 tablet (5 mg total) by mouth every 8 (eight) hours as needed for muscle spasms. Patient not taking: Reported on 09/17/2018 08/28/18   Reva Bores, MD  fluticasone [facility]) 50 MCG/ACT nasal spray Place into both nostrils daily.    [provider]  polyethylene glycol (MIRALAX / GLYCOLAX) packet Take 17 g by mouth daily. Patient not taking: Reported on 09/17/2018 08/15/18   Jamelle Rushing L, DO  Prenatal Vit-Fe Fumarate-FA (MULTIVITAMIN-PRENATAL) 27-0.8 MG TABS  tablet Take 1 tablet by mouth daily at 12 noon.    [provider]  simethicone (GAS-X) 80 MG chewable tablet Chew 1 tablet (80 mg total) by mouth every 6 (six) hours as needed for flatulence. Patient not taking: Reported on 09/17/2018 08/15/18   Jamelle Rushing L, DO    Allergies    Kiwi extract and Pineapple  Review of Systems   Review of Systems  Constitutional: Negative for appetite change.  HENT: Negative for congestion.   Respiratory: Negative for shortness of breath.   Cardiovascular: Negative for chest pain.  Gastrointestinal: Positive for abdominal pain, nausea and vomiting.  Genitourinary: Negative for difficulty urinating.  Musculoskeletal: Negative for back pain.  Skin: Negative for rash.  Neurological: Negative for weakness.  Psychiatric/Behavioral: Negative for confusion.    Physical  Exam Updated Vital Signs BP 112/80 (BP Location: Left Arm)    Pulse 92    Temp 97.9 F (36.6 C) (Oral)    Resp 18    Ht 5\' 3"  (1.6 m)    Wt 68.5 kg    LMP 01/21/2020 (Exact Date)    SpO2 100%    BMI 26.75 kg/m   Physical Exam Vitals and nursing note reviewed.  Constitutional:      Comments: Patient appears somewhat uncomfortable.  HENT:     Head: Normocephalic.  Cardiovascular:     Rate and Rhythm: Normal rate and regular rhythm.  Pulmonary:     Breath sounds: Normal breath sounds.  Abdominal:     Comments: Fragment to above umbilicus.  Somewhat diffuse tenderness.  Genitourinary:    Comments: Pelvic exam deferred at this time. Skin:    General: Skin is warm.     Capillary Refill: Capillary refill takes less than 2 seconds.  Neurological:     Mental Status: She is alert and oriented to person, place, and time.     ED Results / Procedures / Treatments   Labs (all labs ordered are listed, but only abnormal results are displayed) Labs Reviewed  CBC  COMPREHENSIVE METABOLIC PANEL  URINALYSIS, ROUTINE W REFLEX MICROSCOPIC    EKG None  Radiology No results found.  Procedures Procedures (including critical care time)  Medications Ordered in ED Medications  sodium chloride 0.9 % bolus 1,000 mL (1,000 mLs Intravenous New Bag/Given 07/06/20 0750)    ED Course  I have reviewed the triage vital signs and the nursing notes.  Pertinent labs & imaging results that were available during my care of the patient were reviewed by me and considered in my medical decision making (see chart for details).    MDM Rules/Calculators/A&P                         Patient with pregnant abdominal pain.  Constant since 4 AM.  Not crampy.  No contractions seen on toco.  Heart rates around 150.  Baby still moving.  Seen by rapid response OB.  Will transfer to [facility], [facility] accepting. Final Clinical Impression(s) / ED Diagnoses Final diagnoses:  [redacted] weeks gestation of pregnancy   Generalized abdominal pain    Rx / DC Orders ED Discharge Orders    None       Philippa Sicks, MD 07/06/20 [id number]

## 2020-07-06 NOTE — ED Notes (Signed)
[facility] called for transport. 

## 2020-07-06 NOTE — Progress Notes (Signed)
Dr Mora Appl notified that pt is G2P1 at 22w 5d who presented to [facility] ED complaining of constant abdominal pain and vomiting since 0400 this am.  MD notified that fhr is wnl and that no ucs have been traced.  Also made aware that pt has hx of csection x1 due to failure to progress.  ED to give fluid bolus. MD says to transfer pt to MAU and let them assess her there.

## 2020-07-07 LAB — CULTURE, OB URINE

## 2020-08-13 ENCOUNTER — Encounter (HOSPITAL_COMMUNITY): Payer: Self-pay | Admitting: Obstetrics & Gynecology

## 2020-08-13 ENCOUNTER — Inpatient Hospital Stay (HOSPITAL_COMMUNITY)
Admission: RE | Admit: 2020-08-13 | Discharge: 2020-08-13 | Disposition: A | Discharge status: Home / Self Care | Payer: Medicaid Other | Attending: Obstetrics & Gynecology | Admitting: Obstetrics & Gynecology

## 2020-08-13 ENCOUNTER — Inpatient Hospital Stay (HOSPITAL_COMMUNITY): Payer: Medicaid Other

## 2020-08-13 ENCOUNTER — Other Ambulatory Visit: Payer: Self-pay

## 2020-08-13 DIAGNOSIS — U071 COVID-19: Secondary | ICD-10-CM | POA: Diagnosis not present

## 2020-08-13 DIAGNOSIS — O368330 Maternal care for abnormalities of the fetal heart rate or rhythm, third trimester, not applicable or unspecified: Secondary | ICD-10-CM

## 2020-08-13 DIAGNOSIS — Z79899 Other long term (current) drug therapy: Secondary | ICD-10-CM | POA: Insufficient documentation

## 2020-08-13 DIAGNOSIS — O99519 Diseases of the respiratory system complicating pregnancy, unspecified trimester: Secondary | ICD-10-CM | POA: Insufficient documentation

## 2020-08-13 DIAGNOSIS — O98513 Other viral diseases complicating pregnancy, third trimester: Secondary | ICD-10-CM | POA: Diagnosis not present

## 2020-08-13 DIAGNOSIS — J45909 Unspecified asthma, uncomplicated: Secondary | ICD-10-CM | POA: Insufficient documentation

## 2020-08-13 DIAGNOSIS — Z3A28 28 weeks gestation of pregnancy: Secondary | ICD-10-CM

## 2020-08-13 DIAGNOSIS — O368390 Maternal care for abnormalities of the fetal heart rate or rhythm, unspecified trimester, not applicable or unspecified: Secondary | ICD-10-CM

## 2020-08-13 LAB — URINALYSIS, ROUTINE W REFLEX MICROSCOPIC
Bilirubin Urine: NEGATIVE
Glucose, UA: NEGATIVE mg/dL
Hgb urine dipstick: NEGATIVE
Ketones, ur: NEGATIVE mg/dL
Nitrite: NEGATIVE
Protein, ur: NEGATIVE mg/dL
Specific Gravity, Urine: 1.013 (ref 1.005–1.030)
pH: 6 (ref 5.0–8.0)

## 2020-08-13 LAB — COMPREHENSIVE METABOLIC PANEL
ALT: 17 U/L (ref 0–44)
AST: 18 U/L (ref 15–41)
Albumin: 2.7 g/dL — ABNORMAL LOW (ref 3.5–5.0)
Alkaline Phosphatase: 61 U/L (ref 38–126)
Anion gap: 9 (ref 5–15)
BUN: <5 mg/dL — ABNORMAL LOW (ref 6–20)
CO2: 21 mmol/L — ABNORMAL LOW (ref 22–32)
Calcium: 8.7 mg/dL — ABNORMAL LOW (ref 8.9–10.3)
Chloride: 105 mmol/L (ref 98–111)
Creatinine, Ser: 0.75 mg/dL (ref 0.44–1.00)
GFR, Estimated: >60 mL/min (ref >60)
Glucose, Bld: 87 mg/dL (ref 70–99)
Potassium: 4 mmol/L (ref 3.5–5.1)
Sodium: 135 mmol/L (ref 135–145)
Total Bilirubin: 0.4 mg/dL (ref 0.3–1.2)
Total Protein: 5.9 g/dL — ABNORMAL LOW (ref 6.5–8.1)

## 2020-08-13 LAB — CBC WITH DIFFERENTIAL/PLATELET
Abs Immature Granulocytes: 0.02 10*3/uL (ref 0.00–0.07)
Basophils Absolute: 0 10*3/uL (ref 0.0–0.1)
Basophils Relative: 0 %
Eosinophils Absolute: 0 10*3/uL (ref 0.0–0.5)
Eosinophils Relative: 1 %
HCT: 28.6 % — ABNORMAL LOW (ref 36.0–46.0)
Hemoglobin: 9.5 g/dL — ABNORMAL LOW (ref 12.0–15.0)
Immature Granulocytes: 0 %
Lymphocytes Relative: 13 %
Lymphs Abs: 0.6 10*3/uL — ABNORMAL LOW (ref 0.7–4.0)
MCH: 27 pg (ref 26.0–34.0)
MCHC: 33.2 g/dL (ref 30.0–36.0)
MCV: 81.3 fL (ref 80.0–100.0)
Monocytes Absolute: 0.7 10*3/uL (ref 0.1–1.0)
Monocytes Relative: 14 %
Neutro Abs: 3.5 10*3/uL (ref 1.7–7.7)
Neutrophils Relative %: 72 %
Platelets: 203 10*3/uL (ref 150–400)
RBC: 3.52 MIL/uL — ABNORMAL LOW (ref 3.87–5.11)
RDW: 13.4 % (ref 11.5–15.5)
WBC: 5 10*3/uL (ref 4.0–10.5)
nRBC: 0 % (ref 0.0–0.2)

## 2020-08-13 LAB — RESP PANEL BY RT-PCR (FLU A&B, COVID) ARPGX2
Influenza A by PCR: NEGATIVE
Influenza B by PCR: NEGATIVE
SARS Coronavirus 2 by RT PCR: POSITIVE — AB

## 2020-08-13 LAB — GROUP A STREP BY PCR: Group A Strep by PCR: NOT DETECTED

## 2020-08-13 LAB — TROPONIN I (HIGH SENSITIVITY): Troponin I (High Sensitivity): <2 ng/L (ref <18)

## 2020-08-13 LAB — BRAIN NATRIURETIC PEPTIDE: B Natriuretic Peptide: 68.2 pg/mL (ref 0.0–100.0)

## 2020-08-13 MED ORDER — ALBUTEROL SULFATE HFA 108 (90 BASE) MCG/ACT IN AERS
2.0000 | INHALATION_SPRAY | Freq: Once | RESPIRATORY_TRACT | Status: DC | PRN
  Filled 2020-08-13: qty 6.7

## 2020-08-13 MED ORDER — SODIUM CHLORIDE 0.9 % IV SOLN
INTRAVENOUS | Status: DC | PRN
  Administered 2020-08-13: 1000 mL via INTRAVENOUS

## 2020-08-13 MED ORDER — METHYLPREDNISOLONE SODIUM SUCC 125 MG IJ SOLR: 125.0000 mg | Freq: Once | INTRAMUSCULAR | Status: DC | PRN

## 2020-08-13 MED ORDER — DIPHENHYDRAMINE HCL 50 MG/ML IJ SOLN: 50.0000 mg | Freq: Once | INTRAMUSCULAR | Status: DC | PRN

## 2020-08-13 MED ORDER — LACTATED RINGERS IV BOLUS
1000.0000 mL | Freq: Once | INTRAVENOUS | Status: AC
  Administered 2020-08-13: 1000 mL via INTRAVENOUS

## 2020-08-13 MED ORDER — FAMOTIDINE IN NACL 20-0.9 MG/50ML-% IV SOLN: 20.0000 mg | Freq: Once | INTRAVENOUS | Status: DC | PRN

## 2020-08-13 MED ORDER — SODIUM CHLORIDE 0.9 % IV SOLN
1200.0000 mg | Freq: Once | INTRAVENOUS | Status: AC
  Administered 2020-08-13: 1200 mg via INTRAVENOUS
  Filled 2020-08-13: qty 1200

## 2020-08-13 MED ORDER — ACETAMINOPHEN 500 MG PO TABS
1000.0000 mg | ORAL_TABLET | Freq: Once | ORAL | Status: AC
  Administered 2020-08-13: 1000 mg via ORAL
  Filled 2020-08-13: qty 2

## 2020-08-13 MED ORDER — SODIUM CHLORIDE 0.9 % IV BOLUS
1000.0000 mL | Freq: Once | INTRAVENOUS | Status: AC
  Administered 2020-08-13: 1000 mL via INTRAVENOUS

## 2020-08-13 MED ORDER — EPINEPHRINE 0.3 MG/0.3ML IJ SOAJ
0.3000 mg | Freq: Once | INTRAMUSCULAR | Status: DC | PRN
  Filled 2020-08-13: qty 0.6

## 2020-08-13 NOTE — MAU Note (Signed)
Hasn't been feeling good since yesterday.  Hot and cold, chills body ache, cough, HA, sore throat and her chest hurts. Denies loss of taste or sense of smell. Her son had 'a little cold and his asthma was bothering him'.  Checked temp last night was 98.7.

## 2020-08-13 NOTE — MAU Provider Note (Signed)
Chief Complaint:  Generalized Body Aches, Headache, Chills, Cough, and Sore Throat   First Provider Initiated Contact with Patient 08/13/20 1417      HPI: Beth Bray is a 24 y.o. G2P1001 at 1672w1d who presents to maternity admissions reporting onset of body aches, headache, chills, cough and sore throat yesterday. Her son has similar symptoms, starting 1 week ago and was treated at Urgent Care for asthma exacerbation.  She has not tried any treatments.  Her symptoms are gradually worsening and the cough is associated with mild chest and shortness of breath pain today.  There is good fetal movement.     HPI  Past Medical History: Past Medical History:  Diagnosis Date  . Anxiety   . Asthma   . Blood transfusion without reported diagnosis 08/12/2018   2u PP  . Depression     Past obstetric history: OB History  Gravida Para Term Preterm AB Living  2 1 1  0 0 1  SAB TAB Ectopic Multiple Live Births  0 0 0 0 1    # Outcome Date GA Lbr Len/2nd Weight Sex Delivery Anes PTL Lv  2 Current           1 Term 08/12/18 6331w1d  4240 g M CS-[id number] EPI  LIV    Past Surgical History: Past Surgical History:  Procedure Laterality Date  . CESAREAN SECTION N/A 08/12/2018   Procedure: CESAREAN SECTION;  Surgeon: [place] BingPickens, Charlie, MD;  [facility];  Service: Obstetrics;  Laterality: N/A;  . NO PAST SURGERIES    . WISDOM TOOTH EXTRACTION      Family History: Family History  Problem Relation Age of Onset  . Hypertension Mother   . Heart disease Mother   . Thyroid disease Mother     Social History: Social History   Tobacco Use  . Smoking status: Passive Smoke Exposure - Never Smoker  . Smokeless tobacco: Never Used  Vaping Use  . Vaping Use: Never used  Substance Use Topics  . Alcohol use: No  . Drug use: No    Allergies:  Allergies  Allergen Reactions  . Kiwi Extract Swelling  . Pineapple Other (See Comments)    Bumps and sores in mouth    Meds:   Medications Prior to Admission  Medication Sig Dispense Refill Last Dose  . albuterol (PROVENTIL HFA;VENTOLIN HFA) 108 (90 BASE) MCG/ACT inhaler Inhale 3 puffs into the lungs every 4 (four) hours as needed for wheezing. 1 Inhaler 0 Past Week at Unknown time  . ibuprofen (ADVIL) 800 MG tablet Take 800 mg by mouth every 8 (eight) hours as needed.   08/13/2020 at 0600  . Prenatal Vit-Fe Fumarate-FA (MULTIVITAMIN-PRENATAL) 27-0.8 MG TABS tablet Take 1 tablet by mouth daily at 12 noon.   08/12/2020 at Unknown time  . promethazine (PHENERGAN) 25 MG tablet Take 25 mg by mouth every 4 (four) hours as needed for nausea or vomiting.   08/12/2020 at Unknown time  . fluticasone (FLONASE) 50 MCG/ACT nasal spray Place into both nostrils daily.       ROS:  Review of Systems  Constitutional: Positive for chills. Negative for fatigue and fever.  HENT: Positive for sore throat.   Eyes: Negative for visual disturbance.  Respiratory: Positive for shortness of breath.   Cardiovascular: Positive for chest pain.  Gastrointestinal: Negative for abdominal pain, nausea and vomiting.  Genitourinary: Negative for difficulty urinating, dysuria, flank pain, pelvic pain, vaginal bleeding, vaginal discharge and vaginal pain.  Musculoskeletal: Positive for myalgias.  Neurological: Negative for dizziness and headaches.  Psychiatric/Behavioral: Negative.      I have reviewed patient's Past Medical Hx, Surgical Hx, Family Hx, Social Hx, medications and allergies.   Physical Exam   Patient Vitals for the past 24 hrs:  BP Temp Temp src Pulse Resp SpO2 Height Weight  08/13/20 1906 -- -- -- -- -- 99 % -- --  08/13/20 1901 -- -- -- -- -- 99 % -- --  08/13/20 1856 -- -- -- -- -- 100 % -- --  08/13/20 1846 -- -- -- -- -- 99 % -- --  08/13/20 1841 -- -- -- -- -- 99 % -- --  08/13/20 1836 -- -- -- -- -- 99 % -- --  08/13/20 1831 -- -- -- -- -- 99 % -- --  08/13/20 1826 -- -- -- -- -- 99 % -- --  08/13/20 1821 -- -- -- -- --  99 % -- --  08/13/20 1816 -- -- -- -- -- 99 % -- --  08/13/20 1811 -- -- -- -- -- 99 % -- --  08/13/20 1806 -- -- -- -- -- 100 % -- --  08/13/20 1801 -- -- -- -- -- 100 % -- --  08/13/20 1651 -- -- -- -- -- 100 % -- --  08/13/20 1647 (!) 80/42 98.9 F (37.2 C) Oral (!) 131 20 -- -- --  08/13/20 1626 -- -- -- -- -- 100 % -- --  08/13/20 1622 (!) 90/45 99 F (37.2 C) -- (!) 136 16 -- -- --  08/13/20 1230 (!) 97/50 -- -- (!) 115 -- -- -- --  08/13/20 1146 (!) 98/34 98.5 F (36.9 C) Oral (!) 116 18 100 %  (1.626 m) 72.1 kg  08/13/20 1143 -- -- -- -- -- 100 % -- --   Constitutional: Well-developed, well-nourished female in no acute distress.  HEART: normal rate, heart sounds, regular rhythm RESP: normal effort, lung sounds clear and equal bilaterally GI: Abd soft, non-tender, gravid appropriate for gestational age.  MS: Extremities nontender, no edema, normal ROM Neurologic: Alert and oriented x 4.  GU: Neg CVAT.  PELVIC EXAM: Deferred     FHT:  Baseline 145, moderate variability, accelerations present 10 x 10, no decelerations Contractions: None on toco or to palpation   Labs: Results for orders placed or performed during the hospital encounter of 08/13/20 (from the past 24 hour(s))  Urinalysis, Routine w reflex microscopic Urine, Clean Catch     Status: Abnormal   Collection Time: 08/13/20 12:09 PM  Result Value Ref Range   Color, Urine YELLOW YELLOW   APPearance CLEAR CLEAR   Specific Gravity, Urine 1.013 1.005 - 1.030   pH 6.0 5.0 - 8.0   Glucose, UA NEGATIVE NEGATIVE mg/dL   Hgb urine dipstick NEGATIVE NEGATIVE   Bilirubin Urine NEGATIVE NEGATIVE   Ketones, ur NEGATIVE NEGATIVE mg/dL   Protein, ur NEGATIVE NEGATIVE mg/dL   Nitrite NEGATIVE NEGATIVE   Leukocytes,Ua SMALL (A) NEGATIVE   RBC / HPF 11-20 0 - 5 RBC/hpf   WBC, UA 0-5 0 - 5 WBC/hpf   Bacteria, UA RARE (A) NONE SEEN   Squamous Epithelial / LPF 6-10 0 - 5   Mucus PRESENT   Resp Panel by RT-PCR (Flu  A&B, Covid) Nasopharyngeal Swab     Status: Abnormal   Collection Time: 08/13/20  1:37 PM   Specimen: Nasopharyngeal Swab; Nasopharyngeal(NP) swabs in vial transport medium  Result Value Ref Range   SARS Coronavirus  2 by RT PCR POSITIVE (A) NEGATIVE   Influenza A by PCR NEGATIVE NEGATIVE   Influenza B by PCR NEGATIVE NEGATIVE  Group A Strep by PCR     Status: None   Collection Time: 08/13/20  1:38 PM   Specimen: Nasopharyngeal Swab; Sterile Swab  Result Value Ref Range   Group A Strep by PCR NOT DETECTED NOT DETECTED  CBC with Differential/Platelet     Status: Abnormal   Collection Time: 08/13/20  2:20 PM  Result Value Ref Range   WBC 5.0 4.0 - 10.5 K/uL   RBC 3.52 (L) 3.87 - 5.11 MIL/uL   Hemoglobin 9.5 (L) 12.0 - 15.0 g/dL   HCT 74.0 (L) 36 - 46 %   MCV 81.3 80.0 - 100.0 fL   MCH 27.0 26.0 - 34.0 pg   MCHC 33.2 30.0 - 36.0 g/dL   RDW 81.4 48.1 - 85.6 %   Platelets 203 150 - 400 K/uL   nRBC 0.0 0.0 - 0.2 %   Neutrophils Relative % 72 %   Neutro Abs 3.5 1.7 - 7.7 K/uL   Lymphocytes Relative 13 %   Lymphs Abs 0.6 (L) 0.7 - 4.0 K/uL   Monocytes Relative 14 %   Monocytes Absolute 0.7 0.1 - 1.0 K/uL   Eosinophils Relative 1 %   Eosinophils Absolute 0.0 0.0 - 0.5 K/uL   Basophils Relative 0 %   Basophils Absolute 0.0 0.0 - 0.1 K/uL   Immature Granulocytes 0 %   Abs Immature Granulocytes 0.02 0.00 - 0.07 K/uL  Brain natriuretic peptide     Status: None   Collection Time: 08/13/20  2:20 PM  Result Value Ref Range   B Natriuretic Peptide 68.2 0.0 - 100.0 pg/mL  Comprehensive metabolic panel     Status: Abnormal   Collection Time: 08/13/20  2:20 PM  Result Value Ref Range   Sodium 135 135 - 145 mmol/L   Potassium 4.0 3.5 - 5.1 mmol/L   Chloride 105 98 - 111 mmol/L   CO2 21 (L) 22 - 32 mmol/L   Glucose, Bld 87 70 - 99 mg/dL   BUN <5 (L) 6 - 20 mg/dL   Creatinine, Ser 3.14 0.44 - 1.00 mg/dL   Calcium 8.7 (L) 8.9 - 10.3 mg/dL   Total Protein 5.9 (L) 6.5 - 8.1 g/dL   Albumin  2.7 (L) 3.5 - 5.0 g/dL   AST 18 15 - 41 U/L   ALT 17 0 - 44 U/L   Alkaline Phosphatase 61 38 - 126 U/L   Total Bilirubin 0.4 0.3 - 1.2 mg/dL   GFR, Estimated >97 >02 mL/min   Anion gap 9 5 - 15  Troponin I (High Sensitivity)     Status: None   Collection Time: 08/13/20  2:20 PM  Result Value Ref Range   Troponin I (High Sensitivity) <2 <18 ng/L      Imaging:  DG Chest Portable 1 View  Result Date: 08/13/2020 CLINICAL DATA:  Chest pain, shortness of breath, and upper respiratory infection. Patient is pregnant. EXAM: PORTABLE CHEST 1 VIEW COMPARISON:  May 06, 2015 FINDINGS: No pneumothorax. The heart, hila, and mediastinum are normal. No pulmonary nodules or masses. No over pulmonary edema. No focal infiltrates. IMPRESSION: No active disease. Electronically Signed   By: Gerome Sam III M.D   On: 08/13/2020 14:23   EKG: sinus tachycardia.  MAU Course/MDM: Orders Placed This Encounter  Procedures  . Resp Panel by RT-PCR (Flu A&B, Covid) Nasopharyngeal Swab  .  Group A Strep by PCR  . DG Chest Portable 1 View  . Urinalysis, Routine w reflex microscopic  . CBC with Differential/Platelet  . Brain natriuretic peptide  . Comprehensive metabolic panel  . Hypersensitivity GRADE 1: Transient flushing or rash, or drug fever < 100.4 F  . Hypersensitivity GRADE 2: Rash, flushing, urticaria, dyspnea, or drug fever = or > 100.4 F  . Hypersensitivity GRADE 3: symptomatic bronchospasm, with or without urticaria, parenteral medication management indicated, allergy-related edema/angioedema, or hypotension  . Hypersensitivity GRADE 4: Anaphylaxis  . Fetal monitoring  . Assess fetal heart tones  . Provide patient the appropriate monoclonal antibody fact sheet prior to administration  . casirivimab / imdevimab per pharmacy consult  . Airborne and Contact precautions  . ED EKG  . Discharge patient    Meds ordered this encounter  Medications  . casirivimab-imdevimab (REGEN-COV) 1,200 mg in  sodium chloride 0.9 % 110 mL IVPB    Order Specific Question:   I attest that this patient meets the FDA Emergency Use Authorization criteria and has risk factor(s) for progression to severe COVID-19 and/or hospitalization:    Answer:   Yes    Order Specific Question:   High Risk Factor(s) for COVID-19 Progression:    Answer:   Pregnancy  . 0.9 %  sodium chloride infusion  . diphenhydrAMINE (BENADRYL) injection 50 mg  . famotidine (PEPCID) IVPB 20 mg premix  . methylPREDNISolone sodium succinate (SOLU-MEDROL) 125 mg/2 mL injection 125 mg  . albuterol (VENTOLIN HFA) 108 (90 Base) MCG/ACT inhaler 2 puff  . EPINEPHrine (EPI-PEN) injection 0.3 mg  . acetaminophen (TYLENOL) tablet 1,000 mg  . sodium chloride 0.9 % bolus 1,000 mL  . lactated ringers bolus 1,000 mL     NST reviewed and appropriate for gestational age O2 sats 100% on RA, CXR without abnormality, troponin wnl COVID test positive Discussed positive COVID test with pt and s/o, discussed MABs and pt would like the infusion today MABs ordered Pt tolerated well Pt had maternal and fetal tachycardia prior to discharge so second bag of IV fluid given and both improved Tylenol 1000 mg PO given in MAU D/C home with COVID precautions [facility] midwife to reschedule pt office visits next week Return to MAU as needed   Assessment: 1. COVID-19 affecting pregnancy in third trimester   2. [redacted] weeks gestation of pregnancy   3. Fetal tachycardia affecting management of mother     Plan: Discharge home with COVID precautions Labor precautions and fetal kick counts  Follow-up Information    [facility] Follow up.   Specialty: Obstetrics and Gynecology Why: Your office will call to reschedule your appointments for the next 10 days. Return to MAU as needed. Contact information: [address] [place] [address] [phone]             Allergies as of 08/13/2020       Reactions   Kiwi Extract Swelling   Pineapple Other (See Comments)   Bumps and sores in mouth      Medication List    STOP taking these medications   ibuprofen 800 MG tablet Commonly known as: ADVIL     TAKE these medications   albuterol 108 (90 Base) MCG/ACT inhaler Commonly known as: VENTOLIN HFA Inhale 3 puffs into the lungs every 4 (four) hours as needed for wheezing.   fluticasone 50 MCG/ACT nasal spray Commonly known as: FLONASE Place into both nostrils daily.   multivitamin-prenatal 27-0.8 MG  Tabs tablet Take 1 tablet by mouth daily at 12 noon.   promethazine 25 MG tablet Commonly known as: PHENERGAN Take 25 mg by mouth every 4 (four) hours as needed for nausea or vomiting.       Sharen Counter Certified Nurse-Midwife 08/13/2020 7:13 PM

## 2020-08-13 NOTE — Discharge Instructions (Signed)

## 2020-08-16 ENCOUNTER — Ambulatory Visit: Payer: Medicaid Other | Admitting: Internal Medicine

## 2020-08-23 ENCOUNTER — Encounter (HOSPITAL_COMMUNITY): Payer: Self-pay | Admitting: Obstetrics and Gynecology

## 2020-08-23 ENCOUNTER — Inpatient Hospital Stay (HOSPITAL_COMMUNITY)
Admission: AD | Admit: 2020-08-23 | Discharge: 2020-08-23 | Disposition: A | Discharge status: Home / Self Care | Payer: Medicaid Other | Attending: Obstetrics and Gynecology | Admitting: Obstetrics and Gynecology

## 2020-08-23 ENCOUNTER — Other Ambulatory Visit: Payer: Self-pay

## 2020-08-23 DIAGNOSIS — O26893 Other specified pregnancy related conditions, third trimester: Secondary | ICD-10-CM | POA: Diagnosis not present

## 2020-08-23 DIAGNOSIS — Z8616 Personal history of COVID-19: Secondary | ICD-10-CM | POA: Insufficient documentation

## 2020-08-23 DIAGNOSIS — R42 Dizziness and giddiness: Secondary | ICD-10-CM | POA: Insufficient documentation

## 2020-08-23 DIAGNOSIS — Z7722 Contact with and (suspected) exposure to environmental tobacco smoke (acute) (chronic): Secondary | ICD-10-CM | POA: Insufficient documentation

## 2020-08-23 DIAGNOSIS — O99891 Other specified diseases and conditions complicating pregnancy: Secondary | ICD-10-CM

## 2020-08-23 DIAGNOSIS — Z3A29 29 weeks gestation of pregnancy: Secondary | ICD-10-CM | POA: Insufficient documentation

## 2020-08-23 DIAGNOSIS — Z79899 Other long term (current) drug therapy: Secondary | ICD-10-CM | POA: Insufficient documentation

## 2020-08-23 DIAGNOSIS — R0602 Shortness of breath: Secondary | ICD-10-CM | POA: Insufficient documentation

## 2020-08-23 LAB — COMPREHENSIVE METABOLIC PANEL
ALT: 19 U/L (ref 0–44)
AST: 18 U/L (ref 15–41)
Albumin: 2.7 g/dL — ABNORMAL LOW (ref 3.5–5.0)
Alkaline Phosphatase: 60 U/L (ref 38–126)
Anion gap: 10 (ref 5–15)
BUN: 6 mg/dL (ref 6–20)
CO2: 20 mmol/L — ABNORMAL LOW (ref 22–32)
Calcium: 9.2 mg/dL (ref 8.9–10.3)
Chloride: 104 mmol/L (ref 98–111)
Creatinine, Ser: 0.76 mg/dL (ref 0.44–1.00)
GFR, Estimated: >60 mL/min (ref >60)
Glucose, Bld: 91 mg/dL (ref 70–99)
Potassium: 3.8 mmol/L (ref 3.5–5.1)
Sodium: 134 mmol/L — ABNORMAL LOW (ref 135–145)
Total Bilirubin: 0.7 mg/dL (ref 0.3–1.2)
Total Protein: 6.2 g/dL — ABNORMAL LOW (ref 6.5–8.1)

## 2020-08-23 LAB — CBC
HCT: 31.3 % — ABNORMAL LOW (ref 36.0–46.0)
Hemoglobin: 9.6 g/dL — ABNORMAL LOW (ref 12.0–15.0)
MCH: 25.3 pg — ABNORMAL LOW (ref 26.0–34.0)
MCHC: 30.7 g/dL (ref 30.0–36.0)
MCV: 82.4 fL (ref 80.0–100.0)
Platelets: 294 10*3/uL (ref 150–400)
RBC: 3.8 MIL/uL — ABNORMAL LOW (ref 3.87–5.11)
RDW: 13.4 % (ref 11.5–15.5)
WBC: 9.4 10*3/uL (ref 4.0–10.5)
nRBC: 0 % (ref 0.0–0.2)

## 2020-08-23 LAB — MAGNESIUM: Magnesium: 1.8 mg/dL (ref 1.7–2.4)

## 2020-08-23 NOTE — MAU Note (Signed)
Pt complains of ongoing shortness of breath and dizziness. Had COVID December 2nd. States she has appt with cardiology on Dec 27nd.  She has next prenatal Dec 22nd. Denies cntx, VB, LOF. +FM. Was sent by her doctor for evaluation.

## 2020-08-23 NOTE — MAU Provider Note (Signed)
History     CSN: [id number]  Arrival date and time: 08/23/20 1754   Event Date/Time   First Provider Initiated Contact with Patient 08/23/20 1915      Chief Complaint  Patient presents with  . Dizziness  . Shortness of Breath   HPI This is a 24yo G2P1001 at [redacted]w[redacted]d. Sent to MAU for episodes of dizziness and SOB. Was diagnosed with COVID on 12/3, but this was happening before she was diagnosed and for the majority of pregnancy. Episodes happen several times a day and happen when she walks and also when she is at rest. Feels like her heart is pounding fast, she gets SOB, and feels like her vision is getting blurry and like she is going to pass out. Can last for several minutes, but up to an hour.   Denies cough, chest pain, nausea. Good fetal movement. No leaking fluid, bleeding, contractions  OB History    Gravida  2   Para  1   Term  1   Preterm  0   AB  0   Living  1     SAB  0   IAB  0   Ectopic  0   Multiple  0   Live Births  1           Past Medical History:  Diagnosis Date  . Anxiety   . Asthma   . Blood transfusion without reported diagnosis 08/12/2018   2u PP  . Depression     Past Surgical History:  Procedure Laterality Date  . CESAREAN SECTION N/A 08/12/2018   Procedure: CESAREAN SECTION;  Surgeon: [place] Bing, MD;  Location: [facility] BIRTHING SU[facility];  Service: Obstetrics;  Laterality: N/A;  . NO PAST SURGERIES    . WISDOM TOOTH EXTRACTION      Family History  Problem Relation Age of Onset  . Hypertension Mother   . Heart disease Mother   . Thyroid disease Mother     Social History   Tobacco Use  . Smoking status: Passive Smoke Exposure - Never Smoker  . Smokeless tobacco: Never Used  Vaping Use  . Vaping Use: Never used  Substance Use Topics  . Alcohol use: No  . Drug use: No    Allergies:  Allergies  Allergen Reactions  . Kiwi Extract Swelling  . Pineapple Other (See Comments)    Bumps and sores in mouth     Medications Prior to Admission  Medication Sig Dispense Refill Last Dose  . albuterol (PROVENTIL HFA;VENTOLIN HFA) 108 (90 BASE) MCG/ACT inhaler Inhale 3 puffs into the lungs every 4 (four) hours as needed for wheezing. 1 Inhaler 0 08/22/2020 at Unknown time  . fluticasone (FLONASE) 50 MCG/ACT nasal spray Place into both nostrils daily.   Past Week at Unknown time  . Prenatal Vit-Fe Fumarate-FA (MULTIVITAMIN-PRENATAL) 27-0.8 MG TABS tablet Take 1 tablet by mouth daily at 12 noon.   08/23/2020 at Unknown time  . promethazine (PHENERGAN) 25 MG tablet Take 25 mg by mouth every 4 (four) hours as needed for nausea or vomiting.   Past Week at Unknown time    Review of Systems Physical Exam   Blood pressure 106/62, pulse 96, temperature 98.7 F (37.1 C), resp. rate 16, height 5\' 4"  (1.626 m), weight 72.8 kg, last menstrual period 01/21/2020, SpO2 100 %, unknown if currently breastfeeding.  Physical Exam Vitals reviewed.  Constitutional:      Appearance: She is well-developed.  HENT:     Head: Normocephalic and  atraumatic.  Cardiovascular:     Rate and Rhythm: Normal rate and regular rhythm.  Pulmonary:     Effort: Pulmonary effort is normal.     Breath sounds: Normal breath sounds. No decreased breath sounds, wheezing, rhonchi or rales.  Chest:     Chest wall: No mass or tenderness.  Abdominal:     General: Bowel sounds are normal.     Palpations: Abdomen is soft.  Neurological:     General: No focal deficit present.     Mental Status: She is alert and oriented to person, place, and time.  Psychiatric:        Mood and Affect: Mood normal.        Behavior: Behavior normal.    Results for orders placed or performed during the hospital encounter of 08/23/20 (from the past 24 hour(s))  CBC     Status: Abnormal   Collection Time: 08/23/20  7:32 PM  Result Value Ref Range   WBC 9.4 4.0 - 10.5 K/uL   RBC 3.80 (L) 3.87 - 5.11 MIL/uL   Hemoglobin 9.6 (L) 12.0 - 15.0 g/dL   HCT 23.5  (L) 36.1 - 46.0 %   MCV 82.4 80.0 - 100.0 fL   MCH 25.3 (L) 26.0 - 34.0 pg   MCHC 30.7 30.0 - 36.0 g/dL   RDW 44.3 15.4 - 00.8 %   Platelets 294 150 - 400 K/uL   nRBC 0.0 0.0 - 0.2 %     EKG: sinus rhythm. No ST elevations.  MAU Course  Procedures NST reassuring and appropriate for gestational age.  MDM Check CMP, CBC, EKG, Mag  Assessment and Plan   Care turned over to [facility], CNM  Levie Heritage 08/23/2020, 7:24 PM

## 2020-09-06 ENCOUNTER — Ambulatory Visit: Payer: Medicaid Other | Admitting: Internal Medicine

## 2020-09-21 ENCOUNTER — Inpatient Hospital Stay (HOSPITAL_COMMUNITY)
Admission: AD | Admit: 2020-09-21 | Discharge: 2020-09-21 | Disposition: A | Discharge status: Home / Self Care | Payer: Medicaid Other | Attending: Obstetrics & Gynecology | Admitting: Obstetrics & Gynecology

## 2020-09-21 ENCOUNTER — Other Ambulatory Visit: Payer: Self-pay

## 2020-09-21 ENCOUNTER — Encounter (HOSPITAL_COMMUNITY): Payer: Self-pay | Admitting: Obstetrics & Gynecology

## 2020-09-21 DIAGNOSIS — M549 Dorsalgia, unspecified: Secondary | ICD-10-CM

## 2020-09-21 DIAGNOSIS — Z7722 Contact with and (suspected) exposure to environmental tobacco smoke (acute) (chronic): Secondary | ICD-10-CM | POA: Insufficient documentation

## 2020-09-21 DIAGNOSIS — O26893 Other specified pregnancy related conditions, third trimester: Secondary | ICD-10-CM | POA: Diagnosis not present

## 2020-09-21 DIAGNOSIS — Z3A33 33 weeks gestation of pregnancy: Secondary | ICD-10-CM | POA: Diagnosis not present

## 2020-09-21 DIAGNOSIS — O4703 False labor before 37 completed weeks of gestation, third trimester: Secondary | ICD-10-CM

## 2020-09-21 DIAGNOSIS — Z3689 Encounter for other specified antenatal screening: Secondary | ICD-10-CM

## 2020-09-21 LAB — URINALYSIS, ROUTINE W REFLEX MICROSCOPIC
Bilirubin Urine: NEGATIVE
Glucose, UA: 50 mg/dL — AB
Ketones, ur: NEGATIVE mg/dL
Leukocytes,Ua: NEGATIVE
Nitrite: NEGATIVE
Protein, ur: NEGATIVE mg/dL
Specific Gravity, Urine: 1.01 (ref 1.005–1.030)
pH: 7 (ref 5.0–8.0)

## 2020-09-21 LAB — WET PREP, GENITAL
Clue Cells Wet Prep HPF POC: NONE SEEN
Sperm: NONE SEEN
Trich, Wet Prep: NONE SEEN
Yeast Wet Prep HPF POC: NONE SEEN

## 2020-09-21 MED ORDER — CYCLOBENZAPRINE HCL 5 MG PO TABS
10.0000 mg | ORAL_TABLET | Freq: Once | ORAL | Status: AC
  Administered 2020-09-21: 10 mg via ORAL
  Filled 2020-09-21: qty 2

## 2020-09-21 MED ORDER — LACTATED RINGERS IV BOLUS
1000.0000 mL | Freq: Once | INTRAVENOUS | Status: AC
  Administered 2020-09-21: 1000 mL via INTRAVENOUS

## 2020-09-21 MED ORDER — ACETAMINOPHEN 500 MG PO TABS
1000.0000 mg | ORAL_TABLET | Freq: Once | ORAL | Status: AC
  Administered 2020-09-21: 1000 mg via ORAL
  Filled 2020-09-21: qty 2

## 2020-09-21 NOTE — Discharge Instructions (Signed)
Back Pain in Pregnancy Back pain during pregnancy is common. Back pain may be caused by several factors that are related to changes during your pregnancy. Follow these instructions at home: Managing pain, stiffness, and swelling  If directed, for sudden (acute) back pain, put ice on the painful area. ? Put ice in a plastic bag. ? Place a towel between your skin and the bag. ? Leave the ice on for 20 minutes, 2-3 times per day.  If directed, apply heat to the affected area before you exercise. Use the heat source that your health care provider recommends, such as a moist heat pack or a heating pad. ? Place a towel between your skin and the heat source. ? Leave the heat on for 20-30 minutes. ? Remove the heat if your skin turns bright red. This is especially important if you are unable to feel pain, heat, or cold. You may have a greater risk of getting burned.  If directed, massage the affected area.      Activity  Exercise as told by your health care provider. Gentle exercise is the best way to prevent or manage back pain.  Listen to your body when lifting. If lifting hurts, ask for help or bend your knees. This uses your leg muscles instead of your back muscles.  Squat down when picking up something from the floor. Do not bend over.  Only use bed rest for short periods as told by your health care provider. Bed rest should only be used for the most severe episodes of back pain. Standing, sitting, and lying down  Do not stand in one place for long periods of time.  Use good posture when sitting. Make sure your head rests over your shoulders and is not hanging forward. Use a pillow on your lower back if necessary.  Try sleeping on your side, preferably the left side, with a pregnancy support pillow or 1-2 regular pillows between your legs. ? If you have back pain after a night's rest, your bed may be too soft. ? A firm mattress may provide more support for your back during  pregnancy. General instructions  Do not wear high heels.  Eat a healthy diet. Try to gain weight within your health care provider's recommendations.  Use a maternity girdle, elastic sling, or back brace as told by your health care provider.  Take over-the-counter and prescription medicines only as told by your health care provider.  Work with a physical therapist or massage therapist to find ways to manage back pain. Acupuncture or massage therapy may be helpful.  Keep all follow-up visits as told by your health care provider. This is important. Contact a health care provider if:  Your back pain interferes with your daily activities.  You have increasing pain in other parts of your body. Get help right away if:  You develop numbness, tingling, weakness, or problems with the use of your arms or legs.  You develop severe back pain that is not controlled with medicine.  You have a change in bowel or bladder control.  You develop shortness of breath, dizziness, or you faint.  You develop nausea, vomiting, or sweating.  You have back pain that is a rhythmic, cramping pain similar to labor pains. Labor pain is usually 1-2 minutes apart, lasts for about 1 minute, and involves a bearing down feeling or pressure in your pelvis.  You have back pain and your water breaks or you have vaginal bleeding.  You have back pain or   numbness that travels down your leg.  Your back pain developed after you fell.  You develop pain on one side of your back.  You see blood in your urine.  You develop skin blisters in the area of your back pain. Summary  Back pain may be caused by several factors that are related to changes during your pregnancy.  Follow instructions as told by your health care provider for managing pain, stiffness, and swelling.  Exercise as told by your health care provider. Gentle exercise is the best way to prevent or manage back pain.  Take over-the-counter and  prescription medicines only as told by your health care provider.  Keep all follow-up visits as told by your health care provider. This is important. This information is not intended to replace advice given to you by your health care provider. Make sure you discuss any questions you have with your health care provider. Document Revised: 12/17/2018 Document Reviewed: 02/13/2018 Elsevier Patient Education  2021 [employer]  

## 2020-09-21 NOTE — MAU Provider Note (Signed)
History     CSN: [id number]  Arrival date and time: 09/21/20 1255   Event Date/Time   First Provider Initiated Contact with Patient 09/21/20 1340      Chief Complaint  Patient presents with  . Contractions   Beth Bray is a 25 y.o. G2P1001 at [redacted]w[redacted]d who receives care at [facility].  She presents today for Contractions. She reports she was sent from her primary office for contractions and fetal heart rate.  She reports she was on the monitor for pain and was told to come for further evaluation. She reports the pain is located in her lower abdomen and is constant in nature.  She describes the pain as sharp and is also in her lower back.  She reports the pain has been present for "a couple weeks, but just gets worse by the day." She rates the pain a 8/10 and is temporarily relieved with warm showers/baths. She endorses fetal movement and perception of contractions. She denies vaginal discharge, discharge, or leaking.    OB History    Gravida  2   Para  1   Term  1   Preterm  0   AB  0   Living  1     SAB  0   IAB  0   Ectopic  0   Multiple  0   Live Births  1           Past Medical History:  Diagnosis Date  . Anxiety   . Asthma   . Blood transfusion without reported diagnosis 08/12/2018   2u PP  . Depression     Past Surgical History:  Procedure Laterality Date  . CESAREAN SECTION N/A 08/12/2018   Procedure: CESAREAN SECTION;  Surgeon: Assaria Bing, MD;  Location: [facility] BIRTHING SUITES;  Service: Obstetrics;  Laterality: N/A;  . NO PAST SURGERIES    . WISDOM TOOTH EXTRACTION      Family History  Problem Relation Age of Onset  . Hypertension Mother   . Heart disease Mother   . Thyroid disease Mother     Social History   Tobacco Use  . Smoking status: Passive Smoke Exposure - Never Smoker  . Smokeless tobacco: Never Used  Vaping Use  . Vaping Use: Never used  Substance Use Topics  . Alcohol use: No  . Drug use: No    Allergies:  Allergies   Allergen Reactions  . Kiwi Extract Swelling  . Pineapple Other (See Comments)    Bumps and sores in mouth    Medications Prior to Admission  Medication Sig Dispense Refill Last Dose  . albuterol (PROVENTIL HFA;VENTOLIN HFA) 108 (90 BASE) MCG/ACT inhaler Inhale 3 puffs into the lungs every 4 (four) hours as needed for wheezing. 1 Inhaler 0 Past Month at Unknown time  . Prenatal Vit-Fe Fumarate-FA (MULTIVITAMIN-PRENATAL) 27-0.8 MG TABS tablet Take 1 tablet by mouth daily at 12 noon.   09/20/2020 at Unknown time  . fluticasone (FLONASE) 50 MCG/ACT nasal spray Place into both nostrils daily.   More than a month at Unknown time  . promethazine (PHENERGAN) 25 MG tablet Take 25 mg by mouth every 4 (four) hours as needed for nausea or vomiting.   More than a month at Unknown time    Review of Systems  Constitutional: Negative for chills and fever.  Respiratory: Negative for cough and shortness of breath.   Gastrointestinal: Positive for abdominal pain. Negative for nausea and vomiting.  Genitourinary: Negative for difficulty urinating, dysuria, pelvic  pain, vaginal bleeding and vaginal discharge.  Musculoskeletal: Positive for back pain.  Neurological: Positive for headaches (7/10-Frontal; Thumping-States occurs daily; Not resolved with 2 reg tylenol). Negative for dizziness and light-headedness.   Physical Exam   Blood pressure (!) 112/57, pulse (!) 106, temperature 98.7 F (37.1 C), resp. rate (!) 24, weight 73.6 kg, last menstrual period 01/21/2020, SpO2 100 %, unknown if currently breastfeeding.  Physical Exam Vitals reviewed. Exam conducted with a chaperone present.  Constitutional:      Appearance: Normal appearance.  HENT:     Head: Normocephalic and atraumatic.  Eyes:     Conjunctiva/sclera: Conjunctivae normal.  Cardiovascular:     Rate and Rhythm: Tachycardia present.     Heart sounds: Normal heart sounds.  Pulmonary:     Effort: Pulmonary effort is normal. No respiratory  distress.  Abdominal:     General: Bowel sounds are normal.     Tenderness: There is abdominal tenderness.  Genitourinary:    Comments: NEFG Cultures collected blindly. Cervical Exam: Closed/Long Musculoskeletal:        General: Normal range of motion.  Skin:    General: Skin is warm and dry.  Neurological:     Mental Status: She is alert and oriented to person, place, and time.  Psychiatric:        Mood and Affect: Mood normal.        Behavior: Behavior normal.        Thought Content: Thought content normal.     Fetal Assessment 145 bpm, Mod Var, -Decels, +10x10 Accels Toco: Irritability  MAU Course   Results for orders placed or performed during the hospital encounter of 09/21/20 (from the past 24 hour(s))  Urinalysis, Routine w reflex microscopic Urine, Clean Catch     Status: Abnormal   Collection Time: 09/21/20  1:25 PM  Result Value Ref Range   Color, Urine YELLOW YELLOW   APPearance HAZY (A) CLEAR   Specific Gravity, Urine 1.010 1.005 - 1.030   pH 7.0 5.0 - 8.0   Glucose, UA 50 (A) NEGATIVE mg/dL   Hgb urine dipstick MODERATE (A) NEGATIVE   Bilirubin Urine NEGATIVE NEGATIVE   Ketones, ur NEGATIVE NEGATIVE mg/dL   Protein, ur NEGATIVE NEGATIVE mg/dL   Nitrite NEGATIVE NEGATIVE   Leukocytes,Ua NEGATIVE NEGATIVE   RBC / HPF 21-50 0 - 5 RBC/hpf   WBC, UA 0-5 0 - 5 WBC/hpf   Bacteria, UA RARE (A) NONE SEEN   Squamous Epithelial / LPF 6-10 0 - 5   Mucus PRESENT   Wet prep, genital     Status: Abnormal   Collection Time: 09/21/20  1:56 PM  Result Value Ref Range   Yeast Wet Prep HPF POC NONE SEEN NONE SEEN   Trich, Wet Prep NONE SEEN NONE SEEN   Clue Cells Wet Prep HPF POC NONE SEEN NONE SEEN   WBC, Wet Prep HPF POC FEW (A) NONE SEEN   Sperm NONE SEEN    No results found.  MDM PE Labs: Wet prep, GC/CT, UA EFM  Assessment and Plan  25 year old G2P1001  SIUP at 33.5weeks Cat I FT Contractions Back Pain  -POC Reviewed -Exam performed and findings  discussed. -Cultures collected and pending.  -Pain medication offered and accepted. -Will give flexeril, tylenol, and heating pad.   Cherre Robins MSN, CNM 09/21/2020, 1:41 PM   Reassessment (2:56 PM)  -Patient reports back pain has ceased, but lower abdominal pain remains. -Will give additional bag of fluid as  patient not candidate for procardia s/t blood pressure.  -Informed of wet prep results. GC/CT pending.  -Patient verbalized understanding.  -Will return and reassess.  -NST Reactive  Reassessment (3:58 PM) -Patient reports improvement in cramping with 2nd bolus. -Discussed discharge with rest at home. -Preterm labor precautions discussed. -Encouraged to call primary ob or return to MAU if symptoms worsen or with the onset of new symptoms. -Discharged to home in improved condition.  Cherre Robins MSN, CNM Advanced Practice Provider, [facility] [employer]

## 2020-09-21 NOTE — MAU Note (Signed)
Pt reports that she was at the doctor office today and was told that the baby's heart rate was higher than usual and to come to mau for further evaluation.   Pt reports she was told that she was having ctx's and that she could be checked in MAU.   Denies vaginal bleeding, or LOF   Reports +FM

## 2020-09-23 LAB — GC/CHLAMYDIA PROBE AMP (~~LOC~~) NOT AT ARMC
Chlamydia: NEGATIVE
Comment: NEGATIVE
Comment: NORMAL
Neisseria Gonorrhea: NEGATIVE

## 2020-10-01 ENCOUNTER — Other Ambulatory Visit: Payer: Medicaid Other

## 2020-10-05 LAB — OB RESULTS CONSOLE GBS: GBS: NEGATIVE

## 2020-10-06 ENCOUNTER — Ambulatory Visit: Payer: Medicaid Other | Admitting: Internal Medicine

## 2020-10-06 NOTE — Progress Notes (Deleted)
Cardiology Office Note:    Date:  10/06/2020   ID:  Beth Bray, DOB 01-03-1996, MRN [id number]  PCP:  Harvest Forest, MD  [facility] Cardiologist:  No primary care provider on file.  [facility] Electrophysiologist:  None   CC: *** Consulted for the evaluation of palpitations during pregnancy at the behest of Harvest Forest, MD  History of Present Illness:    Beth Bray is a 25 y.o. female with a hx of anxiety who presents for evaluation 10/06/20.  Patient notes that she is feeling ***.  Has had no chest pain, chest pressure, chest tightness, chest stinging ***.  Discomfort occurs with ***, worsens with ***, and improves with ***.  Patient exertion notable for *** with *** and feels no symptoms.  No shortness of breath, DOE ***.  No PND or orthopnea***.  No bendopnea***, weight gain***, leg swelling ***, or abdominal swelling***.  No syncope or near syncope ***.  Notes *** no palpitations or funny heart beats.     Patient reports prior cardiac testing including *** echo, *** stress test, *** heart catheterizations, *** cardioversion, *** ablations.  No history of ***pre-eclampsia or prematurity.  No Fen-Phen or drug use***.  Ambulatory BP ***.   Past Medical History:  Diagnosis Date  . Anxiety   . Asthma   . Blood transfusion without reported diagnosis 08/12/2018   2u PP  . Depression     Past Surgical History:  Procedure Laterality Date  . CESAREAN SECTION N/A 08/12/2018   Procedure: CESAREAN SECTION;  Surgeon: [place] Bing, MD;  Location: [facility] BIRTHING SUITES;  Service: Obstetrics;  Laterality: N/A;  . NO PAST SURGERIES    . WISDOM TOOTH EXTRACTION      Current Medications: No outpatient medications have been marked as taking for the 10/06/20 encounter (Appointment) with Christell Constant, MD.     Allergies:   Kiwi extract and Pineapple   Social History   Socioeconomic History  . Marital status: Single    Spouse name: Not on file  .  Number of children: Not on file  . Years of education: Not on file  . Highest education level: Not on file  Occupational History  . Not on file  Tobacco Use  . Smoking status: Passive Smoke Exposure - Never Smoker  . Smokeless tobacco: Never Used  Vaping Use  . Vaping Use: Never used  Substance and Sexual Activity  . Alcohol use: No  . Drug use: No  . Sexual activity: Not Currently    Birth control/protection: None  Other Topics Concern  . Not on file  Social History Narrative  . Not on file   Social Determinants of Health   Financial Resource Strain: Not on file  Food Insecurity: Not on file  Transportation Needs: Not on file  Physical Activity: Not on file  Stress: Not on file  Social Connections: Not on file     Family History: The patient's family history includes Heart disease in her mother; Hypertension in her mother; Thyroid disease in her mother.  History of coronary artery disease notable for ***. History of heart failure notable for ***. No history of cardiomyopathies including hypertrophic cardiomyopathy, left ventricular non-compaction, or arrhythmogenic right ventricular cardiomyopathy.*** History of arrhythmia notable for ***. Denies family history of sudden cardiac death including drowning, car accidents, or unexplained deaths in the family.*** No history of bicuspid aortic valve or aortic aneurysm or dissection.   ROS:   Please see the history of present illness.    ***  All other systems reviewed and are negative.  EKGs/Labs/Other Studies Reviewed:    The following studies were reviewed today:  EKG:   08/23/20: SR Rate 90 WNL  Recent Labs: 08/13/2020: B Natriuretic Peptide 68.2 08/23/2020: ALT 19; BUN 6; Creatinine, Ser 0.76; Hemoglobin 9.6; Magnesium 1.8; Platelets 294; Potassium 3.8; Sodium 134  Recent Lipid Panel No results found for: CHOL, TRIG, HDL, CHOLHDL, VLDL, LDLCALC, LDLDIRECT   Risk Assessment/Calculations:    CARPREG II-  ***  Physical Exam:    VS:  LMP 01/21/2020 (Exact Date)     Orthostatic Vitals: Supine:  BP ***  HR *** Sitting:  BP ***  HR *** Standing:  BP ***  HR *** Prolonged Stand:  BP ***  HR ***   Wt Readings from Last 3 Encounters:  09/21/20 162 lb 5 oz (73.6 kg)  08/23/20 160 lb 9.6 oz (72.8 kg)  08/13/20 159 lb (72.1 kg)     GEN: *** Well nourished, well developed in no acute distress HEENT: Normal NECK: No JVD; No carotid bruits LYMPHATICS: No lymphadenopathy CARDIAC: ***RRR, no murmurs, rubs, gallops RESPIRATORY:  Clear to auscultation without rales, wheezing or rhonchi  ABDOMEN: Soft, non-tender, non-distended MUSCULOSKELETAL:  No edema; No deformity  SKIN: Warm and dry NEUROLOGIC:  Alert and oriented x 3 PSYCHIATRIC:  Normal affect   ASSESSMENT:    No diagnosis found. PLAN:    In order of problems listed above:  Pregnancy with related sequelae - ***Chronic HTN (SBP 140+ or DBP 90+) before 20 weeks - ***Gestational HTN (SBP 140+ or DBP 90+) after 20 weeks - *** With pre-eclampsia (proteinuria, Plts < 100, Creatinine > 1.1, LFTS 2X normal, Pulmonary Edema, New HA/visual sx)*** - *** With HELLP (LDH 600+, Plts < 100, Pulmonary Edema)  - ***  - CARPREG II Score of *** - Low-dose aspirin (81 mg/day) prophylaxis is recommended in women at high risk of preeclampsia and should be initiated between 12 weeks and 28 weeks of gestation (optimally before 16 weeks) and continued daily until delivery. - On *** nifiedipine - On *** labetalol - On *** Hydralazine - will get echocardiogram - will get 14 day non live ziopatch   {Are you ordering a CV Procedure (e.g. stress test, cath, DCCV, TEE, etc)?   Press F2        :[id number]}    Medication Adjustments/Labs and Tests Ordered: Current medicines are reviewed at length with the patient today.  Concerns regarding medicines are outlined above.  No orders of the defined types were placed in this encounter.  No orders of the  defined types were placed in this encounter.   There are no Patient Instructions on file for this visit.   Signed, Christell Constant, MD  10/06/2020 7:28 AM    [facility]

## 2020-10-20 LAB — OB RESULTS CONSOLE GC/CHLAMYDIA
Chlamydia: NEGATIVE
Gonorrhea: NEGATIVE

## 2020-10-26 ENCOUNTER — Inpatient Hospital Stay (HOSPITAL_BASED_OUTPATIENT_CLINIC_OR_DEPARTMENT_OTHER): Payer: Medicaid Other

## 2020-10-26 ENCOUNTER — Inpatient Hospital Stay (HOSPITAL_COMMUNITY)
Admission: AD | Admit: 2020-10-26 | Discharge: 2020-10-26 | Disposition: A | Discharge status: Home / Self Care | Payer: Medicaid Other | Attending: Obstetrics & Gynecology | Admitting: Obstetrics & Gynecology

## 2020-10-26 ENCOUNTER — Other Ambulatory Visit: Payer: Self-pay

## 2020-10-26 ENCOUNTER — Encounter (HOSPITAL_COMMUNITY): Payer: Self-pay | Admitting: Obstetrics & Gynecology

## 2020-10-26 DIAGNOSIS — O26893 Other specified pregnancy related conditions, third trimester: Secondary | ICD-10-CM | POA: Insufficient documentation

## 2020-10-26 DIAGNOSIS — Z3689 Encounter for other specified antenatal screening: Secondary | ICD-10-CM

## 2020-10-26 DIAGNOSIS — Z79899 Other long term (current) drug therapy: Secondary | ICD-10-CM | POA: Insufficient documentation

## 2020-10-26 DIAGNOSIS — Z7722 Contact with and (suspected) exposure to environmental tobacco smoke (acute) (chronic): Secondary | ICD-10-CM | POA: Insufficient documentation

## 2020-10-26 DIAGNOSIS — Z3A38 38 weeks gestation of pregnancy: Secondary | ICD-10-CM | POA: Diagnosis not present

## 2020-10-26 DIAGNOSIS — O368130 Decreased fetal movements, third trimester, not applicable or unspecified: Secondary | ICD-10-CM | POA: Insufficient documentation

## 2020-10-26 DIAGNOSIS — O471 False labor at or after 37 completed weeks of gestation: Secondary | ICD-10-CM | POA: Diagnosis not present

## 2020-10-26 DIAGNOSIS — R102 Pelvic and perineal pain: Secondary | ICD-10-CM | POA: Insufficient documentation

## 2020-10-26 DIAGNOSIS — O368330 Maternal care for abnormalities of the fetal heart rate or rhythm, third trimester, not applicable or unspecified: Secondary | ICD-10-CM | POA: Insufficient documentation

## 2020-10-26 DIAGNOSIS — O288 Other abnormal findings on antenatal screening of mother: Secondary | ICD-10-CM

## 2020-10-26 NOTE — MAU Provider Note (Signed)
History     CSN: [id number]  Arrival date and time: 10/26/20 1348   Event Date/Time   First Provider Initiated Contact with Patient 10/26/20 1447      Chief Complaint  Patient presents with  . Decreased Fetal Movement   HPI Beth Bray is a 25 y.o. G2P1001 at [redacted]w[redacted]d who presents to MAU with chief complaint of decreased fetal movement. This is a new problem, onset two days ago. Patient states the movement is "not as much as usual". She is not familiar with interventions to trigger fetal movement. As of 1500 hours she has only eaten a small bowl of cereal.   Patient also c/o pelvic pain and lower abdominal discomfort. These are recurrent problems. She is not sure she is having contractions. She feels as if her discomfort is gradually worsening over time. She has not taken medication or tried other treaments for this complaint. She denies vaginal bleeding, leaking of fluid,  fever, falls, or recent illness.   Receives care with [facility], next appointment is tomorrow at 0900.  OB History    Gravida  2   Para  1   Term  1   Preterm  0   AB  0   Living  1     SAB  0   IAB  0   Ectopic  0   Multiple  0   Live Births  1           Past Medical History:  Diagnosis Date  . Anxiety   . Asthma   . Blood transfusion without reported diagnosis 08/12/2018   2u PP  . Depression     Past Surgical History:  Procedure Laterality Date  . CESAREAN SECTION N/A 08/12/2018   Procedure: CESAREAN SECTION;  Surgeon: [place] Bing, MD;  Location: [facility] BIRTHING SUITES;  Service: Obstetrics;  Laterality: N/A;  . NO PAST SURGERIES    . WISDOM TOOTH EXTRACTION      Family History  Problem Relation Age of Onset  . Hypertension Mother   . Heart disease Mother   . Thyroid disease Mother     Social History   Tobacco Use  . Smoking status: Passive Smoke Exposure - Never Smoker  . Smokeless tobacco: Never Used  Vaping Use  . Vaping Use: Never used  Substance Use Topics  .  Alcohol use: No  . Drug use: No    Allergies:  Allergies  Allergen Reactions  . Kiwi Extract Swelling  . Pineapple Other (See Comments)    Bumps and sores in mouth    Medications Prior to Admission  Medication Sig Dispense Refill Last Dose  . Prenatal Vit-Fe Fumarate-FA (MULTIVITAMIN-PRENATAL) 27-0.8 MG TABS tablet Take 1 tablet by mouth daily at 12 noon.   10/25/2020 at Unknown time  . albuterol (PROVENTIL HFA;VENTOLIN HFA) 108 (90 BASE) MCG/ACT inhaler Inhale 3 puffs into the lungs every 4 (four) hours as needed for wheezing. 1 Inhaler 0 More than a month at Unknown time  . fluticasone (FLONASE) 50 MCG/ACT nasal spray Place into both nostrils daily.   More than a month at Unknown time  . promethazine (PHENERGAN) 25 MG tablet Take 25 mg by mouth every 4 (four) hours as needed for nausea or vomiting.   More than a month at Unknown time    Review of Systems  Gastrointestinal: Positive for abdominal pain.  Genitourinary: Positive for pelvic pain. Negative for vaginal bleeding.  All other systems reviewed and are negative.  Physical Exam  Blood pressure (!) 106/52, pulse (!) 106, temperature 98.2 F (36.8 C), resp. rate 17, weight 74.5 kg, last menstrual period 01/21/2020, unknown if currently breastfeeding.  Physical Exam Vitals and nursing note reviewed. Exam conducted with a chaperone present.  Constitutional:      Appearance: Normal appearance.  Cardiovascular:     Rate and Rhythm: Normal rate.     Pulses: Normal pulses.  Pulmonary:     Effort: Pulmonary effort is normal.     Breath sounds: Normal breath sounds.  Abdominal:     Comments: Gravid  Skin:    Capillary Refill: Capillary refill takes less than 2 seconds.  Neurological:     Mental Status: She is alert and oriented to person, place, and time.  Psychiatric:        Mood and Affect: Mood normal.        Behavior: Behavior normal.        Thought Content: Thought content normal.        Judgment: Judgment normal.      MAU Course  Procedures  --Patient with poor reactivity on fetal tracing in setting of very low PO intake today. Tracing reactive after PO hydration with apple juice.  --Cat I tracing: baseline 130, mod var, + 15 x 15 accels, no decels  Patient Vitals for the past 24 hrs:  BP Temp Pulse Resp Weight  10/26/20 1610 (!) 97/53 -- (!) 106 -- --  10/26/20 1427 (!) 108/59 -- (!) 101 -- --  10/26/20 1404 (!) 106/52 98.2 F (36.8 C) (!) 106 17 74.5 kg   Assessment and Plan  --25 y.o. G2P1001 at [redacted]w[redacted]d  --Reactive tracing s/p PO intake --BPP 8/8 --Cervx closed, soft, posterior --Patient endorses fetal movement, denies pain prior to discharge --Discharge home in stable condition  F/U: --Patient's next appointment is tomorrow morning at 0900  Calvert Cantor, CNM 10/26/2020, 7:12 PM

## 2020-10-26 NOTE — MAU Note (Signed)
Patient states her lower abdominal/pelvic pain and pressure has been getting worse everyday.  Then noted DFM 2 days ago (FHR 150-160s).  Denies VB/LOF.  Denies complications w/ her pregnany.  Some sporadic contractions.

## 2020-10-26 NOTE — Discharge Instructions (Signed)
Biophysical Profile A biophysical profile is a noninvasive test that checks the health of the developing baby (fetus) and the placenta of the pregnant person. This test may be recommended when a pregnancy is at a higher risk for certain problems. A biophysical profile is usually done during the last 3 months of pregnancy (third trimester). This procedure combines two tests. In one test, a device strapped to your belly will measure your baby's heart rate. The other test uses sound waves and a computer (ultrasound) to create an image of your baby inside your uterus. The ultrasound also measures the amount of fluid (amniotic fluid) inside your uterus. These tests tell your health care provider about the overall health of your baby. Tell a health care provider about:  Any allergies you have.  All medicines you are taking, including vitamins, herbs, eye drops, creams, and over-the-counter medicines.  Any medical conditions you have, such as high blood pressure or diabetes.  Any concerns you have about your pregnancy or any pregnancy-related complications. These may include: ? Abdominal pain or contractions. ? Nausea or vomiting. ? Vaginal bleeding. ? Leaking of amniotic fluid. ? Decreased fetal movements. ? Fever or infection. ? Increased swelling. ? Headaches. ? Problems with your vision.  How often you feel your baby move. What are the risks? There are no risks to you or your baby from a biophysical profile. What happens before the procedure? Ask your health care provider how to prepare for this test. You may be asked to:  Drink fluids so that you have a full bladder for your ultrasound.  Eat before you arrive for the test. This makes your baby more active. What happens during the procedure?  You will lie on your back on an exam table.  A belt with a sensor will be placed around your belly to measure your baby's heart rate.  You may have to wear another belt and sensor to measure  any muscle movements (contractions) in your uterus.  During the ultrasound, a health care provider or technician will put a small amount of gel on your belly and gently roll a handheld device (transducer) over it. This device sends signals to a computer that creates images of your baby.  Five areas of your baby's health and development will be checked during the biophysical profile: ? Heart rate. ? Breathing. ? Movement. ? Active muscle movement (muscle tone). ? The amount of fluid in your uterus (amniotic fluid). The procedure may vary among health care providers and hospitals.      What can I expect after the procedure?  Your health care provider will discuss your results with you. The results of a biophysical profile are scored in a range of 0 to 10. The scoring is as follows: ? Each area that is evaluated is given a score of 0 or 2 points. ? If you get a score of 6 or less, you may need further testing, or your baby may need to be delivered early. ? A score of 8 to 10 with normal amniotic fluid levels is considered normal.  Unless you need additional testing, you can go home right after the procedure and resume your normal activities. Where to find more information  Office on Women's Health: http://hoffman.com/  [employer] of Obstetricians and Gynecologists: www.acog.org Summary  A biophysical profile is a noninvasive test to check that your developing baby (fetus) and your placenta are healthy.  A biophysical profile combines two tests: a test to measure your baby's  heart rate and an ultrasound test to create an image of your baby in the uterus. The ultrasound also measures the amount of amniotic fluid inside your uterus.  Tell your health care provider about any concerns you have about your pregnancy or any pregnancy-related complications.  If you get a score of 6 or less, you may need further testing, or your baby may need to be delivered early. A score of 8 to 10  with normal amniotic fluid levels is considered normal. This information is not intended to replace advice given to you by your health care provider. Make sure you discuss any questions you have with your health care provider. Document Revised: 12/30/2019 Document Reviewed: 12/30/2019 Elsevier Patient Education  2021 [employer].

## 2020-10-27 ENCOUNTER — Encounter (HOSPITAL_COMMUNITY): Payer: Self-pay

## 2020-10-27 ENCOUNTER — Other Ambulatory Visit: Payer: Self-pay | Admitting: Obstetrics and Gynecology

## 2020-10-27 NOTE — Patient Instructions (Signed)
Beth Bray  10/27/2020   Your procedure is scheduled on:  10/29/2020  Arrive at 1:15 PM at Entrance C on [address] at [facility]  and [address]. You are invited to use the FREE valet parking or use the Visitor's parking deck.  Pick up the phone at the desk and dial [phone].  Call this number if you have problems the morning of surgery: [phone]  Remember:   Do not eat food:(After Midnight) Desps de medianoche.  Do not drink clear liquids: (After Midnight) Desps de medianoche.  Take these medicines the morning of surgery with A SIP OF WATER:  none   Do not wear jewelry, make-up or nail polish.  Do not wear lotions, powders, or perfumes. Do not wear deodorant.  Do not shave 48 hours prior to surgery.  Do not bring valuables to the hospital.  [facility] is not   responsible for any belongings or valuables brought to the hospital.  Contacts, dentures or bridgework may not be worn into surgery.  Leave suitcase in the car. After surgery it may be brought to your room.  For patients admitted to the hospital, checkout time is 11:00 AM the day of              discharge.      Please read over the following fact sheets that you were given:     Preparing for Surgery

## 2020-10-28 ENCOUNTER — Encounter (HOSPITAL_COMMUNITY)
Admission: RE | Admit: 2020-10-28 | Discharge: 2020-10-28 | Disposition: A | Discharge status: Home / Self Care | Payer: Medicaid Other | Source: Ambulatory Visit | Attending: Obstetrics and Gynecology | Admitting: Obstetrics and Gynecology

## 2020-10-28 ENCOUNTER — Encounter (HOSPITAL_COMMUNITY): Payer: Self-pay

## 2020-10-28 ENCOUNTER — Other Ambulatory Visit: Payer: Self-pay

## 2020-10-28 DIAGNOSIS — Z01812 Encounter for preprocedural laboratory examination: Secondary | ICD-10-CM | POA: Insufficient documentation

## 2020-10-28 HISTORY — DX: Supervision of pregnancy with other poor reproductive or obstetric history, unspecified trimester: O09.299

## 2020-10-28 LAB — CBC
HCT: 29.3 % — ABNORMAL LOW (ref 36.0–46.0)
Hemoglobin: 8.7 g/dL — ABNORMAL LOW (ref 12.0–15.0)
MCH: 22.3 pg — ABNORMAL LOW (ref 26.0–34.0)
MCHC: 29.7 g/dL — ABNORMAL LOW (ref 30.0–36.0)
MCV: 75.1 fL — ABNORMAL LOW (ref 80.0–100.0)
Platelets: 236 10*3/uL (ref 150–400)
RBC: 3.9 MIL/uL (ref 3.87–5.11)
RDW: 17 % — ABNORMAL HIGH (ref 11.5–15.5)
WBC: 6.2 10*3/uL (ref 4.0–10.5)
nRBC: 0 % (ref 0.0–0.2)

## 2020-10-28 NOTE — Anesthesia Preprocedure Evaluation (Addendum)
Anesthesia Evaluation  Patient identified by MRN, date of birth, ID band Patient awake    Reviewed: Allergy & Precautions, NPO status , Patient's Chart, lab work & pertinent test results  Airway Mallampati: II  TM Distance: >3 FB Neck ROM: Full    Dental no notable dental hx.    Pulmonary asthma ,    Pulmonary exam normal breath sounds clear to auscultation       Cardiovascular negative cardio ROS Normal cardiovascular exam Rhythm:Regular Rate:Normal     Neuro/Psych PSYCHIATRIC DISORDERS Anxiety Depression negative neurological ROS     GI/Hepatic negative GI ROS, Neg liver ROS,   Endo/Other  negative endocrine ROS  Renal/GU negative Renal ROS  negative genitourinary   Musculoskeletal negative musculoskeletal ROS (+)   Abdominal   Peds negative pediatric ROS (+)  Hematology  (+) Blood dyscrasia, anemia , H/H 8.7/29.3, plt 236   Anesthesia Other Findings   Reproductive/Obstetrics (+) Pregnancy 1 prior section 2019 w/ PPH requiring 2units prbc Possible fetal macrosomia w/ this pregnancy                             Anesthesia Physical Anesthesia Plan  ASA: II  Anesthesia Plan: Spinal   Post-op Pain Management:    Induction:   PONV Risk Score and Plan: 3 and Ondansetron, Dexamethasone and Treatment may vary due to age or medical condition  Airway Management Planned: Natural Airway and Nasal Cannula  Additional Equipment: None  Intra-op Plan:   Post-operative Plan:   Informed Consent: I have reviewed the patients History and Physical, chart, labs and discussed the procedure including the risks, benefits and alternatives for the proposed anesthesia with the patient or authorized representative who has indicated his/her understanding and acceptance.       Plan Discussed with: CRNA  Anesthesia Plan Comments: (History of PPH w/ last section requiring pRBCs + baseline anemia Hb  8.7; will cross for 2 units)       Anesthesia Quick Evaluation

## 2020-10-29 ENCOUNTER — Inpatient Hospital Stay (HOSPITAL_COMMUNITY): Payer: Medicaid Other | Admitting: Anesthesiology

## 2020-10-29 ENCOUNTER — Inpatient Hospital Stay (HOSPITAL_COMMUNITY)
Admission: RE | Admit: 2020-10-29 | Discharge: 2020-11-01 | DRG: 787 | Disposition: A | Discharge status: Home / Self Care | Payer: Medicaid Other | Attending: Obstetrics and Gynecology | Admitting: Obstetrics and Gynecology

## 2020-10-29 ENCOUNTER — Other Ambulatory Visit: Payer: Self-pay

## 2020-10-29 ENCOUNTER — Encounter (HOSPITAL_COMMUNITY): Payer: Self-pay | Admitting: Obstetrics and Gynecology

## 2020-10-29 ENCOUNTER — Encounter (HOSPITAL_COMMUNITY)
Admission: RE | Disposition: A | Discharge status: Home / Self Care | Payer: Self-pay | Source: Home / Self Care | Attending: Obstetrics and Gynecology

## 2020-10-29 DIAGNOSIS — O9081 Anemia of the puerperium: Secondary | ICD-10-CM | POA: Diagnosis not present

## 2020-10-29 DIAGNOSIS — O3663X0 Maternal care for excessive fetal growth, third trimester, not applicable or unspecified: Secondary | ICD-10-CM | POA: Diagnosis present

## 2020-10-29 DIAGNOSIS — F32A Depression, unspecified: Secondary | ICD-10-CM | POA: Diagnosis not present

## 2020-10-29 DIAGNOSIS — D62 Acute posthemorrhagic anemia: Secondary | ICD-10-CM | POA: Diagnosis not present

## 2020-10-29 DIAGNOSIS — Z3A39 39 weeks gestation of pregnancy: Secondary | ICD-10-CM

## 2020-10-29 DIAGNOSIS — O99344 Other mental disorders complicating childbirth: Secondary | ICD-10-CM | POA: Diagnosis not present

## 2020-10-29 DIAGNOSIS — F419 Anxiety disorder, unspecified: Secondary | ICD-10-CM | POA: Diagnosis not present

## 2020-10-29 DIAGNOSIS — O34211 Maternal care for low transverse scar from previous cesarean delivery: Principal | ICD-10-CM | POA: Diagnosis present

## 2020-10-29 DIAGNOSIS — Z8616 Personal history of COVID-19: Secondary | ICD-10-CM

## 2020-10-29 DIAGNOSIS — D509 Iron deficiency anemia, unspecified: Secondary | ICD-10-CM | POA: Diagnosis present

## 2020-10-29 DIAGNOSIS — O9952 Diseases of the respiratory system complicating childbirth: Secondary | ICD-10-CM | POA: Diagnosis present

## 2020-10-29 DIAGNOSIS — J45909 Unspecified asthma, uncomplicated: Secondary | ICD-10-CM | POA: Diagnosis not present

## 2020-10-29 LAB — CBC
HCT: 27.2 % — ABNORMAL LOW (ref 36.0–46.0)
Hemoglobin: 7.9 g/dL — ABNORMAL LOW (ref 12.0–15.0)
MCH: 22.1 pg — ABNORMAL LOW (ref 26.0–34.0)
MCHC: 29 g/dL — ABNORMAL LOW (ref 30.0–36.0)
MCV: 76 fL — ABNORMAL LOW (ref 80.0–100.0)
Platelets: 205 10*3/uL (ref 150–400)
RBC: 3.58 MIL/uL — ABNORMAL LOW (ref 3.87–5.11)
RDW: 17 % — ABNORMAL HIGH (ref 11.5–15.5)
WBC: 6.5 10*3/uL (ref 4.0–10.5)
nRBC: 0 % (ref 0.0–0.2)

## 2020-10-29 LAB — RPR: RPR Ser Ql: NONREACTIVE

## 2020-10-29 LAB — PREPARE RBC (CROSSMATCH)

## 2020-10-29 SURGERY — Surgical Case
Anesthesia: Spinal

## 2020-10-29 MED ORDER — ZOLPIDEM TARTRATE 5 MG PO TABS: 5.0000 mg | ORAL_TABLET | Freq: Every evening | ORAL | Status: DC | PRN

## 2020-10-29 MED ORDER — HYDROMORPHONE HCL 1 MG/ML IJ SOLN
INTRAMUSCULAR | Status: AC
  Filled 2020-10-29: qty 0.5

## 2020-10-29 MED ORDER — FERROUS SULFATE 325 (65 FE) MG PO TABS
325.0000 mg | ORAL_TABLET | Freq: Every day | ORAL | Status: DC
  Administered 2020-10-30 – 2020-10-31 (×2): 325 mg via ORAL
  Filled 2020-10-29 (×3): qty 1

## 2020-10-29 MED ORDER — WITCH HAZEL-GLYCERIN EX PADS: 1.0000 "application " | MEDICATED_PAD | CUTANEOUS | Status: DC | PRN

## 2020-10-29 MED ORDER — SODIUM CHLORIDE 0.9% FLUSH: 3.0000 mL | INTRAVENOUS | Status: DC | PRN

## 2020-10-29 MED ORDER — BUPIVACAINE IN DEXTROSE 0.75-8.25 % IT SOLN
INTRATHECAL | Status: DC | PRN
  Administered 2020-10-29: 1.6 mL via INTRATHECAL

## 2020-10-29 MED ORDER — MEPERIDINE HCL 25 MG/ML IJ SOLN: 6.2500 mg | INTRAMUSCULAR | Status: DC | PRN

## 2020-10-29 MED ORDER — DIBUCAINE (PERIANAL) 1 % EX OINT: 1.0000 "application " | TOPICAL_OINTMENT | CUTANEOUS | Status: DC | PRN

## 2020-10-29 MED ORDER — TRANEXAMIC ACID-NACL 1000-0.7 MG/100ML-% IV SOLN
INTRAVENOUS | Status: AC
  Filled 2020-10-29: qty 100

## 2020-10-29 MED ORDER — LACTATED RINGERS IV SOLN: INTRAVENOUS | Status: DC

## 2020-10-29 MED ORDER — NALBUPHINE HCL 10 MG/ML IJ SOLN: 5.0000 mg | INTRAMUSCULAR | Status: DC | PRN

## 2020-10-29 MED ORDER — PHENYLEPHRINE HCL-NACL 20-0.9 MG/250ML-% IV SOLN
INTRAVENOUS | Status: DC | PRN
  Administered 2020-10-29: 60 ug/min via INTRAVENOUS

## 2020-10-29 MED ORDER — OXYCODONE HCL 5 MG PO TABS: 5.0000 mg | ORAL_TABLET | Freq: Once | ORAL | Status: DC | PRN

## 2020-10-29 MED ORDER — PRENATAL MULTIVITAMIN CH
1.0000 | ORAL_TABLET | Freq: Every day | ORAL | Status: DC
  Administered 2020-10-30 – 2020-10-31 (×2): 1 via ORAL
  Filled 2020-10-29 (×2): qty 1

## 2020-10-29 MED ORDER — COCONUT OIL OIL
1.0000 "application " | TOPICAL_OIL | Status: DC | PRN
  Administered 2020-10-30: 1 via TOPICAL

## 2020-10-29 MED ORDER — SIMETHICONE 80 MG PO CHEW
80.0000 mg | CHEWABLE_TABLET | Freq: Three times a day (TID) | ORAL | Status: DC
  Administered 2020-10-30 – 2020-11-01 (×7): 80 mg via ORAL
  Filled 2020-10-29 (×7): qty 1

## 2020-10-29 MED ORDER — SIMETHICONE 80 MG PO CHEW: 80.0000 mg | CHEWABLE_TABLET | ORAL | Status: DC | PRN

## 2020-10-29 MED ORDER — OXYTOCIN-SODIUM CHLORIDE 30-0.9 UT/500ML-% IV SOLN
INTRAVENOUS | Status: AC
  Filled 2020-10-29: qty 500

## 2020-10-29 MED ORDER — SENNOSIDES-DOCUSATE SODIUM 8.6-50 MG PO TABS
2.0000 | ORAL_TABLET | Freq: Every day | ORAL | Status: DC
  Administered 2020-10-30 – 2020-10-31 (×2): 2 via ORAL
  Filled 2020-10-29 (×3): qty 2

## 2020-10-29 MED ORDER — KETOROLAC TROMETHAMINE 30 MG/ML IJ SOLN: 30.0000 mg | Freq: Four times a day (QID) | INTRAMUSCULAR | Status: AC | PRN

## 2020-10-29 MED ORDER — POVIDONE-IODINE 10 % EX SWAB
2.0000 "application " | Freq: Once | CUTANEOUS | Status: AC
  Administered 2020-10-29: 2 via TOPICAL

## 2020-10-29 MED ORDER — ONDANSETRON HCL 4 MG/2ML IJ SOLN
INTRAMUSCULAR | Status: DC | PRN
  Administered 2020-10-29: 4 mg via INTRAVENOUS

## 2020-10-29 MED ORDER — DIPHENHYDRAMINE HCL 50 MG/ML IJ SOLN
INTRAMUSCULAR | Status: AC
  Filled 2020-10-29: qty 1

## 2020-10-29 MED ORDER — OXYCODONE HCL 5 MG/5ML PO SOLN: 5.0000 mg | Freq: Once | ORAL | Status: DC | PRN

## 2020-10-29 MED ORDER — NALOXONE HCL 4 MG/10ML IJ SOLN
1.0000 ug/kg/h | INTRAVENOUS | Status: DC | PRN
  Filled 2020-10-29: qty 5

## 2020-10-29 MED ORDER — HYDROMORPHONE HCL 1 MG/ML IJ SOLN
0.2500 mg | INTRAMUSCULAR | Status: DC | PRN
  Administered 2020-10-29 (×2): 0.25 mg via INTRAVENOUS

## 2020-10-29 MED ORDER — FENTANYL CITRATE (PF) 100 MCG/2ML IJ SOLN
INTRAMUSCULAR | Status: AC
  Filled 2020-10-29: qty 2

## 2020-10-29 MED ORDER — SCOPOLAMINE 1 MG/3DAYS TD PT72
MEDICATED_PATCH | TRANSDERMAL | Status: AC
  Filled 2020-10-29: qty 1

## 2020-10-29 MED ORDER — FENTANYL CITRATE (PF) 100 MCG/2ML IJ SOLN: 50.0000 ug | INTRAMUSCULAR | Status: DC | PRN

## 2020-10-29 MED ORDER — DIPHENHYDRAMINE HCL 50 MG/ML IJ SOLN
12.5000 mg | INTRAMUSCULAR | Status: DC | PRN
  Administered 2020-10-29 (×2): 12.5 mg via INTRAVENOUS
  Filled 2020-10-29: qty 1

## 2020-10-29 MED ORDER — OXYTOCIN-SODIUM CHLORIDE 30-0.9 UT/500ML-% IV SOLN
INTRAVENOUS | Status: DC | PRN
  Administered 2020-10-29: 300 mL via INTRAVENOUS

## 2020-10-29 MED ORDER — MORPHINE SULFATE (PF) 0.5 MG/ML IJ SOLN
INTRAMUSCULAR | Status: DC | PRN
  Administered 2020-10-29: .15 mg via INTRATHECAL

## 2020-10-29 MED ORDER — PROMETHAZINE HCL 25 MG/ML IJ SOLN: 6.2500 mg | INTRAMUSCULAR | Status: DC | PRN

## 2020-10-29 MED ORDER — LACTATED RINGERS IV SOLN: INTRAVENOUS | Status: DC | PRN

## 2020-10-29 MED ORDER — CEFAZOLIN SODIUM-DEXTROSE 2-4 GM/100ML-% IV SOLN
INTRAVENOUS | Status: AC
  Filled 2020-10-29: qty 100

## 2020-10-29 MED ORDER — NALBUPHINE HCL 10 MG/ML IJ SOLN: 5.0000 mg | Freq: Once | INTRAMUSCULAR | Status: DC | PRN

## 2020-10-29 MED ORDER — TRANEXAMIC ACID-NACL 1000-0.7 MG/100ML-% IV SOLN
INTRAVENOUS | Status: DC | PRN
  Administered 2020-10-29: 1000 mg via INTRAVENOUS

## 2020-10-29 MED ORDER — MORPHINE SULFATE (PF) 0.5 MG/ML IJ SOLN
INTRAMUSCULAR | Status: AC
  Filled 2020-10-29: qty 10

## 2020-10-29 MED ORDER — NALOXONE HCL 0.4 MG/ML IJ SOLN: 0.4000 mg | INTRAMUSCULAR | Status: DC | PRN

## 2020-10-29 MED ORDER — NALBUPHINE HCL 10 MG/ML IJ SOLN
5.0000 mg | Freq: Once | INTRAMUSCULAR | Status: DC | PRN
Start: 2020-10-29 — End: 2020-11-01

## 2020-10-29 MED ORDER — DEXAMETHASONE SODIUM PHOSPHATE 10 MG/ML IJ SOLN
INTRAMUSCULAR | Status: DC | PRN
  Administered 2020-10-29: 8 mg via INTRAVENOUS

## 2020-10-29 MED ORDER — OXYTOCIN-SODIUM CHLORIDE 30-0.9 UT/500ML-% IV SOLN
2.5000 [IU]/h | INTRAVENOUS | Status: AC
  Administered 2020-10-29: 2.5 [IU]/h via INTRAVENOUS
  Filled 2020-10-29: qty 500

## 2020-10-29 MED ORDER — OXYCODONE HCL 5 MG PO TABS
5.0000 mg | ORAL_TABLET | ORAL | Status: DC | PRN
  Administered 2020-10-30 (×2): 5 mg via ORAL
  Administered 2020-10-31 – 2020-11-01 (×5): 10 mg via ORAL
  Filled 2020-10-29 (×2): qty 1
  Filled 2020-10-29 (×2): qty 2
  Filled 2020-10-29: qty 1
  Filled 2020-10-29: qty 2
  Filled 2020-10-29: qty 1
  Filled 2020-10-29: qty 2

## 2020-10-29 MED ORDER — SODIUM CHLORIDE 0.9 % IV SOLN: INTRAVENOUS | Status: DC | PRN

## 2020-10-29 MED ORDER — TETANUS-DIPHTH-ACELL PERTUSSIS 5-2.5-18.5 LF-MCG/0.5 IM SUSY: 0.5000 mL | PREFILLED_SYRINGE | Freq: Once | INTRAMUSCULAR | Status: DC

## 2020-10-29 MED ORDER — KETOROLAC TROMETHAMINE 30 MG/ML IJ SOLN
INTRAMUSCULAR | Status: AC
  Filled 2020-10-29: qty 1

## 2020-10-29 MED ORDER — KETOROLAC TROMETHAMINE 30 MG/ML IJ SOLN
30.0000 mg | Freq: Four times a day (QID) | INTRAMUSCULAR | Status: AC
  Administered 2020-10-29 – 2020-10-30 (×3): 30 mg via INTRAVENOUS
  Filled 2020-10-29 (×3): qty 1

## 2020-10-29 MED ORDER — DIPHENHYDRAMINE HCL 25 MG PO CAPS: 25.0000 mg | ORAL_CAPSULE | ORAL | Status: DC | PRN

## 2020-10-29 MED ORDER — ONDANSETRON HCL 4 MG/2ML IJ SOLN: 4.0000 mg | Freq: Three times a day (TID) | INTRAMUSCULAR | Status: DC | PRN

## 2020-10-29 MED ORDER — SCOPOLAMINE 1 MG/3DAYS TD PT72
1.0000 | MEDICATED_PATCH | Freq: Once | TRANSDERMAL | Status: AC
  Administered 2020-10-29: 1.5 mg via TRANSDERMAL

## 2020-10-29 MED ORDER — FENTANYL CITRATE (PF) 100 MCG/2ML IJ SOLN
INTRAMUSCULAR | Status: DC | PRN
  Administered 2020-10-29: 15 ug via INTRATHECAL

## 2020-10-29 MED ORDER — CEFAZOLIN SODIUM-DEXTROSE 2-3 GM-%(50ML) IV SOLR
INTRAVENOUS | Status: DC | PRN
  Administered 2020-10-29: 2 g via INTRAVENOUS

## 2020-10-29 MED ORDER — MENTHOL 3 MG MT LOZG: 1.0000 | LOZENGE | OROMUCOSAL | Status: DC | PRN

## 2020-10-29 MED ORDER — ACETAMINOPHEN 500 MG PO TABS
1000.0000 mg | ORAL_TABLET | Freq: Four times a day (QID) | ORAL | Status: AC
  Administered 2020-10-29 – 2020-10-30 (×4): 1000 mg via ORAL
  Filled 2020-10-29 (×5): qty 2

## 2020-10-29 MED ORDER — CEFAZOLIN SODIUM-DEXTROSE 2-4 GM/100ML-% IV SOLN: 2.0000 g | INTRAVENOUS | Status: DC

## 2020-10-29 MED ORDER — KETOROLAC TROMETHAMINE 30 MG/ML IJ SOLN
30.0000 mg | Freq: Once | INTRAMUSCULAR | Status: AC | PRN
  Administered 2020-10-29: 30 mg via INTRAVENOUS

## 2020-10-29 MED ORDER — IBUPROFEN 600 MG PO TABS
600.0000 mg | ORAL_TABLET | Freq: Four times a day (QID) | ORAL | Status: DC | PRN
  Administered 2020-10-31 (×3): 600 mg via ORAL
  Filled 2020-10-29 (×3): qty 1

## 2020-10-29 MED ORDER — DIPHENHYDRAMINE HCL 25 MG PO CAPS: 25.0000 mg | ORAL_CAPSULE | Freq: Four times a day (QID) | ORAL | Status: DC | PRN

## 2020-10-29 MED ORDER — ACETAMINOPHEN 325 MG PO TABS
650.0000 mg | ORAL_TABLET | ORAL | Status: DC | PRN
  Administered 2020-10-31 (×3): 650 mg via ORAL
  Filled 2020-10-29 (×4): qty 2

## 2020-10-29 SURGICAL SUPPLY — 37 items
APL SKNCLS STERI-STRIP NONHPOA (GAUZE/BANDAGES/DRESSINGS) ×1
BENZOIN TINCTURE PRP APPL 2/3 (GAUZE/BANDAGES/DRESSINGS) ×2 IMPLANT
CHLORAPREP W/TINT 26ML (MISCELLANEOUS) ×2 IMPLANT
CLAMP CORD UMBIL (MISCELLANEOUS) IMPLANT
CLOTH BEACON ORANGE TIMEOUT ST (SAFETY) ×2 IMPLANT
DRSG OPSITE POSTOP 4X10 (GAUZE/BANDAGES/DRESSINGS) ×2 IMPLANT
ELECT REM PT RETURN 9FT ADLT (ELECTROSURGICAL) ×2
ELECTRODE REM PT RTRN 9FT ADLT (ELECTROSURGICAL) ×1 IMPLANT
EXTRACTOR VACUUM M CUP 4 TUBE (SUCTIONS) IMPLANT
GLOVE BIO SURGEON STRL SZ7.5 (GLOVE) ×2 IMPLANT
GLOVE BIOGEL PI IND STRL 7.0 (GLOVE) ×1 IMPLANT
GLOVE BIOGEL PI IND STRL 7.5 (GLOVE) ×1 IMPLANT
GLOVE BIOGEL PI INDICATOR 7.0 (GLOVE) ×1
GLOVE BIOGEL PI INDICATOR 7.5 (GLOVE) ×1
GOWN STRL REUS W/TWL LRG LVL3 (GOWN DISPOSABLE) ×4 IMPLANT
HEMOSTAT ARISTA ABSORB 3G PWDR (HEMOSTASIS) ×2 IMPLANT
KIT ABG SYR 3ML LUER SLIP (SYRINGE) IMPLANT
NEEDLE HYPO 25X5/8 SAFETYGLIDE (NEEDLE) IMPLANT
NS IRRIG 1000ML POUR BTL (IV SOLUTION) ×2 IMPLANT
PACK C SECTION WH (CUSTOM PROCEDURE TRAY) ×2 IMPLANT
PAD OB MATERNITY 4.3X12.25 (PERSONAL CARE ITEMS) ×2 IMPLANT
PENCIL SMOKE EVAC W/HOLSTER (ELECTROSURGICAL) ×2 IMPLANT
RTRCTR C-SECT PINK 25CM LRG (MISCELLANEOUS) ×2 IMPLANT
STRIP CLOSURE SKIN 1/2X4 (GAUZE/BANDAGES/DRESSINGS) ×2 IMPLANT
STRIP SURGICAL 1/4 X 6 IN (GAUZE/BANDAGES/DRESSINGS) ×2 IMPLANT
SUT CHROMIC 2 0 CT 1 (SUTURE) ×2 IMPLANT
SUT MNCRL AB 3-0 PS2 27 (SUTURE) ×2 IMPLANT
SUT PLAIN 2 0 XLH (SUTURE) ×2 IMPLANT
SUT VIC AB 0 CT1 36 (SUTURE) ×2 IMPLANT
SUT VIC AB 0 CTX 36 (SUTURE) ×8
SUT VIC AB 0 CTX36XBRD ANBCTRL (SUTURE) ×4 IMPLANT
SUT VIC AB 2-0 SH 27 (SUTURE)
SUT VIC AB 2-0 SH 27XBRD (SUTURE) IMPLANT
TOWEL OR 17X24 6PK STRL BLUE (TOWEL DISPOSABLE) ×2 IMPLANT
TRAY FOLEY W/BAG SLVR 14FR LF (SET/KITS/TRAYS/PACK) ×2 IMPLANT
WATER STERILE IRR 1000ML POUR (IV SOLUTION) ×2 IMPLANT
YANKAUER SUCT BULB TIP NO VENT (SUCTIONS) ×2 IMPLANT

## 2020-10-29 NOTE — Lactation Note (Signed)
This note was copied from a baby's chart. Lactation Consultation Note  Patient Name: Beth Bray [id number] Date: 10/29/2020 Reason for consult: Follow-up assessment;Term;Other (Comment) (DAT+) Age:24 hours P2, term female infant. Per mom, infant had BF for 10 minutes prior to [facility] entering the room. LC did not observe latch at this time. Per mom, she is familiar with jaundice her 1st child was as well. Mom  Has not latched infant on her right breast, LC observed mom has inverted nipple. LC gave mom hand pump and showed mom how to pre-pump breast prior to latching infant and nipple evert completely outward mom was pleased to see this. Mom will continue to work on latching infant on both breast according to cues, 8 to 12+ or more times within 24 hours, STS. LC discussed input and out with parents. Mom was set up with the  DEBP and was pumping as LC left the room, mom knows to pump every 3 hours for 15 minutes on initial setting.  Mom will give infant back any EBM from hand expression or pumping after she latches infant at the breast. Mom knows to call RN or LC if she has questions, concerns or needs assistance with latching infant at the breast. Mom made aware of O/P services, breastfeeding support groups, community resources, and our phone # for post-discharge questions.  Maternal Data Has patient been taught Hand Expression?: Yes Does the patient have breastfeeding experience prior to this delivery?: Yes How long did the patient breastfeed?: Per mom, she BF 1st child for 1 month but struggled, mom has inverted nipple on right breast only  Feeding Mother's Current Feeding Choice: Breast Milk  LATCH Score                    Lactation Tools Discussed/Used    Interventions Interventions: Breast feeding basics reviewed;Skin to skin;Hand express;Pre-pump if needed;DEBP;Hand pump;Education;Expressed milk  Discharge Pump: DEBP;Manual WIC Program: Yes  Consult  Status Consult Status: Follow-up Date: 10/30/20 Follow-up type: In-patient    [name] 10/29/2020, 10:22 PM

## 2020-10-29 NOTE — Op Note (Signed)
Cesarean Section Procedure Note  Indications: P1 at 27 1/7wks with h/o c-section and LGA who presented for repeat c-section.  Pre-operative Diagnosis: 1.39 1/7wks 2.History of Cesarean Section 3.LGA/Possible Macrosomia    Post-operative Diagnosis: 1.39 1/7wks 2.History of Cesarean Section 3.LGA/Possible Macrosomia   Procedure: REPEAT CESAREAN SECTION  Surgeon: Osborn Coho, MD    Assistants: Dale Alderson, CNM  Anesthesia: Regional  Anesthesiologist: Lannie Fields, DO   Procedure Details  The patient was taken to the operating room secondary to h/o c-section after the risks, benefits, complications, treatment options, and expected outcomes were discussed with the patient.  The patient concurred with the proposed plan, giving informed consent which was signed and witnessed. The patient was taken to Operating Room A, identified as Beth Bray and the procedure verified as C-Section Delivery. A Time Out was held and the above information confirmed.  After induction of anesthesia by obtaining a spinal, the patient was prepped and draped in the usual sterile manner. A Pfannenstiel skin incision was made and carried down through the subcutaneous tissue to the underlying layer of fascia.  The fascia was incised bilaterally and extended transversely bilaterally with the Mayo scissors. Kocher clamps were placed on the inferior aspect of the fascial incision and the underlying rectus muscle was separated from the fascia. The same was done on the superior aspect of the fascial incision.  The peritoneum was identified, entered bluntly and extended sharply and manually.  A thick adhesion was noted fro anterior uterine wall to the anterior peritoneum adjacent to the bladder.  This was excised with the bovie.  An Alexis self-retaining retractor was placed.  The utero-vesical peritoneal reflection was incised transversely and the bladder flap was bluntly freed from the lower uterine segment. A low  transverse uterine incision was made with the scalpel and extended bilaterally with the bandage scissors.  The infant was delivered in vertex position without difficulty.  After the umbilical cord was clamped and cut, the infant was handed to the awaiting pediatricians.  Cord blood was obtained for evaluation.  The placenta was removed intact and appeared to be within normal limits. The uterus was cleared of all clots and debris. The uterine incision was closed with running interlocking sutures of 0 Vicryl and several figure of eight stitches were placed for hemostasis prior to suturing a second imbricating layer.   Bilateral tubes and ovaries appeared to be within normal limits.  Two figure of eight stitches were placed at the incision where some oozing was noted.  Txa was given and arista placed on the incision.  Good hemostasis was noted.  Copious irrigation was performed until clear.  The peritoneum was repaired with 2-0 chromic via a running suture.  The fascia was reapproximated with a running suture of 0 Vicryl.  The skin was reapproximated with a subcuticular suture of 3-0 monocryl.  Steristrips were applied.  Instrument, sponge, and needle counts were correct prior to abdominal closure and at the conclusion of the case.  The patient was awaiting transfer to the recovery room in good condition.  Findings: Live female infant with Apgars 8 at one minute and 9 at five minutes.  Normal appearing bilateral ovaries and fallopian tubes were noted. Istat 8.2 in OR immediately postop.  Estimated Blood Loss:  1025 ml         Drains: foley to gravity with 300 cc clear urine         Total IV Fluids: 2300 ml  Specimens to Pathology: none         Complications:  None; patient tolerated the procedure well.         Disposition: PACU - hemodynamically stable.         Condition: stable  Attending Attestation: I performed the procedure.  I was present and scrubbed and the assistant was required  due to complexity of anatomy.

## 2020-10-29 NOTE — H&P (Addendum)
Beth Bray is a 25 y.o. female, G2P1001, IUP at 39.1 weeks, presenting for repeat LTCS for LGA 97%. Pt endorse + Fm. Denies vaginal leakage. Denies vaginal bleeding. Denies feeling cxt's.   Prenatal Problems: Asthma (stable with PRN inhaler)  COVID-19 (Dx 12/3, received monoclonal antibodies) depressive disorder/Anxiety disorder (Rx Zoloft at [facility], referral to [facility] for counseling 04/07/20.) history of cesarean section (08/2018, arrest of dilation; pp anemia, blood transfusion) large for gestation age fetus (first baby 9.3lbs, this pregnancy 97% on 2/16)   Patient Active Problem List   Diagnosis Date Noted  . Pregnancy of unknown anatomic location 03/12/2020  . History of cesarean delivery due to arrest of dilation 09/17/2018  . Postoperative anemia 08/15/2018     Medications Prior to Admission  Medication Sig Dispense Refill Last Dose  . albuterol (PROVENTIL HFA;VENTOLIN HFA) 108 (90 BASE) MCG/ACT inhaler Inhale 3 puffs into the lungs every 4 (four) hours as needed for wheezing. (Patient taking differently: Inhale 2 puffs into the lungs every 4 (four) hours as needed for wheezing.) 1 Inhaler 0   . Prenatal Vit-Fe Fumarate-FA (MULTIVITAMIN-PRENATAL) 27-0.8 MG TABS tablet Take 1 tablet by mouth daily.       Past Medical History:  Diagnosis Date  . Anxiety   . Asthma   . Blood transfusion without reported diagnosis 08/12/2018   2u PP  . Depression   . History of postpartum hemorrhage, currently pregnant      No current facility-administered medications on file prior to encounter.   Current Outpatient Medications on File Prior to Encounter  Medication Sig Dispense Refill  . albuterol (PROVENTIL HFA;VENTOLIN HFA) 108 (90 BASE) MCG/ACT inhaler Inhale 3 puffs into the lungs every 4 (four) hours as needed for wheezing. (Patient taking differently: Inhale 2 puffs into the lungs every 4 (four) hours as needed for wheezing.) 1 Inhaler 0  . Prenatal Vit-Fe Fumarate-FA  (MULTIVITAMIN-PRENATAL) 27-0.8 MG TABS tablet Take 1 tablet by mouth daily.       Allergies  Allergen Reactions  . Kiwi Extract Swelling  . Pineapple Other (See Comments)    Bumps and sores in mouth    History of present pregnancy: Pt Info/Preference:  Screening/Consents:  Labs:   EDD: Estimated Date of Delivery: 11/04/20  Establised: Patient's last menstrual period was 01/21/2020 (exact date).  Anatomy Scan: Date: 06/17/2020 Placenta Location: posterior Genetic Screen: Panoroma:LR female AFP: Normal First Tri: Quad: Horizon: Normal  Office: [facility]            Md: DR Su Hilt First PNV: [redacted] weeks gestation Blood Type --/--/O POS (02/17 1143)  Language: english Last PNV: 38.[redacted] weeks gestation Rhogam    Flu Vaccine:  UTD   Antibody NEG (02/17 1143)  TDaP vaccine UTD   GTT: Early: 5.3 Third Trimester: 148 on 1/11 3H GTT WNL on 1/31  Feeding Plan: Breast/bottle BTL: no Rubella: Immune (07/28 0000)  Contraception: ??? VBAC: desires RCS RPR: NON REACTIVE (02/17 1138)   Circumcision: Female, N/A   HBsAg: Negative (07/28 0000)  Pediatrician:  ???   HIV: Non-reactive (07/28 0000)   Prenatal Classes: no Additional [place]: 2/15 BPP 8/8, cephalic, AFI 15.4, 2/16 growth 9.4lbs 97%, AFI 11.8, vertex, posterior placenta.  GBS: Negative/-- (01/25 0000)(For PCN allergy, check sensitivities)       Chlamydia: neg    MFM Referral/Consult:  GC: neg  Support Person: partner   PAP: ???  Pain Management: spinal Neonatologist Referral:  Hgb Electrophoresis:  AA  Birth Plan: RCS   Hgb NOB: 12.5  28W: 11.2   OB History    Gravida  2   Para  1   Term  1   Preterm  0   AB  0   Living  1     SAB  0   IAB  0   Ectopic  0   Multiple  0   Live Births  1          Past Medical History:  Diagnosis Date  . Anxiety   . Asthma   . Blood transfusion without reported diagnosis 08/12/2018   2u PP  . Depression   . History of postpartum hemorrhage, currently pregnant    Past Surgical History:   Procedure Laterality Date  . CESAREAN SECTION N/A 08/12/2018   Procedure: CESAREAN SECTION;  Surgeon: Trego Bing, MD;  Location: [facility] BIRTHING SUITES;  Service: Obstetrics;  Laterality: N/A;  . NO PAST SURGERIES    . WISDOM TOOTH EXTRACTION     Family History: family history includes Heart disease in her mother; Hypertension in her mother; Thyroid disease in her mother. Social History:  reports that she is a non-smoker but has been exposed to tobacco smoke. She has never used smokeless tobacco. She reports that she does not drink alcohol and does not use drugs.   Prenatal Transfer Tool  Maternal Diabetes: No Genetic Screening: Normal Maternal Ultrasounds/Referrals: Normal Fetal Ultrasounds or other Referrals:  None LGA >97% on 2/16 Maternal Substance Abuse:  No Significant Maternal Medications:  None Significant Maternal Lab Results: Group B Strep negative  ROS:  Review of Systems  Constitutional: Negative.   HENT: Negative.   Eyes: Negative.   Respiratory: Negative.   Cardiovascular: Negative.   Gastrointestinal: Negative.   Genitourinary:       Pelvis discomfort   Musculoskeletal: Negative.   Skin: Negative.   Neurological: Negative.   Endo/Heme/Allergies: Negative.   Psychiatric/Behavioral: Negative.      Physical Exam: BP 107/82 (BP Location: Right Arm)   Pulse 88   Temp 98.4 F (36.9 C) (Oral)   Resp 18   Ht 5\' 3"  (1.6 m)   Wt 74.4 kg   LMP 01/21/2020 (Exact Date)   SpO2 100%   BMI 29.05 kg/m   Physical Exam Vitals and nursing note reviewed. Exam conducted with a chaperone present.  Constitutional:      Appearance: Normal appearance.  HENT:     Head: Normocephalic and atraumatic.     Nose: Nose normal.     Mouth/Throat:     Mouth: Mucous membranes are moist.  Eyes:     Conjunctiva/sclera: Conjunctivae normal.     Pupils: Pupils are equal, round, and reactive to light.  Cardiovascular:     Rate and Rhythm: Normal rate and regular rhythm.      Pulses: Normal pulses.     Heart sounds: Normal heart sounds.  Pulmonary:     Effort: Pulmonary effort is normal.     Breath sounds: Normal breath sounds.  Abdominal:     General: Bowel sounds are normal.  Genitourinary:    Comments: Deferred  Musculoskeletal:        General: Normal range of motion.     Cervical back: Normal range of motion and neck supple.  Skin:    General: Skin is warm.     Capillary Refill: Capillary refill takes less than 2 seconds.  Neurological:     General: No focal deficit present.     Mental Status: She is alert.  Psychiatric:  Mood and Affect: Mood normal.      FHR 152 UC:   Occ, UI SVE: Deferred  Leopold's: vertex., EFW 8lbs via leopold's. Bedside US confirmed   Labs: Results for orders placed or performed during the hospital encounter of 10/29/20 (from the past 24 hour(s))  Prepare RBC (crossmatch)     Status: None   Collection Time: 10/29/20  1:57 PM  Result Value Ref Range   Order Confirmation      ORDER PROCESSED BY BLOOD BANK Performed at [facility], [address] [place], [place] [address]     Imaging:  US MFM Fetal BPP Wo Non Stress  Result Date: 10/27/2020 ----------------------------------------------------------------------  OBSTETRICS REPORT                       (Signed Final 10/27/2020 08:59 am) ---------------------------------------------------------------------- Patient Info  ID #:       [id number]                          D.O.B.:  [birth year] (24 yrs)  Name:       Beth Bray                   Visit Date: 10/26/2020 03:24 pm ---------------------------------------------------------------------- Performed By  Attending:        Lin Landsmanorenthian Booker      Referred By:      [facility]                    MD  Performed By:     Percell BostonHeather Waken          [facility]  ---------------------------------------------------------------------- Orders  #  Description                           Code        Ordered By  1  US MFM FETAL BPP WO NON               [id number]    SAMANTHA     STRESS                                            WEINHOLD ----------------------------------------------------------------------  #  Order #                     Accession #                Episode #  1  [id number]                   [id number]                 [id number] ---------------------------------------------------------------------- Indications  [redacted] weeks gestation of pregnancy                Z3A.38  Decreased fetal movement  O36.8190  Non-reactive NST                               O28.9 ---------------------------------------------------------------------- Fetal Evaluation  Num Of Fetuses:         1  Fetal Heart Rate(bpm):  152  Cardiac Activity:       Observed  Presentation:           Cephalic  Amniotic Fluid  AFI FV:      Within normal limits  AFI Sum(cm)     %Tile       Largest Pocket(cm)  15.4            61          7.2  RUQ(cm)       RLQ(cm)       LUQ(cm)        LLQ(cm)  2.1           0             7.2            6.1 ---------------------------------------------------------------------- Biophysical Evaluation  Amniotic F.V:   Within normal limits       F. Tone:        Observed  F. Movement:    Observed                   Score:          8/8  F. Breathing:   Observed ---------------------------------------------------------------------- OB History  Gravidity:    2         Term:   1  Living:       1 ---------------------------------------------------------------------- Gestational Age  Best:          38w 5d     Det. By:  Marcella Dubs         EDD:   11/04/20                                      (03/12/20) ---------------------------------------------------------------------- Anatomy  Thoracic:              Appears normal         Bladder:                Appears normal  Stomach:                Appears normal, left                         sided ---------------------------------------------------------------------- Impression  Antenatal testing performed given decreased fetal movement  and nonreactive NST.  The biophysical profile was 8/8 with good fetal movement and  amniotic fluid volume. ---------------------------------------------------------------------- Recommendations  Consider delivery by 39 weeks. ----------------------------------------------------------------------               Lin Landsman, MD Electronically Signed Final Report   10/27/2020 08:59 am ----------------------------------------------------------------------   MAU Course: Orders Placed This Encounter  Procedures  . Diet NPO time specified Except for: Sips with Meds  . Informed Consent Details: Physician/Practitioner Attestation; Transcribe to consent form and obtain patient signature  . Initiate Pre-op Protocol  . Pre-admission testing diagnosis  . Anesthesia per Enhanced Recovery Pathway  . Clip operative site  . SCD's  . Verify informed consent  . Patient needs pre-operative appointment  .  Sequential compression device (SCD's) to OR  . Prepare RBC (crossmatch)   Meds ordered this encounter  Medications  . scopolamine (TRANSDERM-SCOP) 1 MG/3DAYS 1.5 mg  . povidone-iodine 10 % swab 2 application  . lactated ringers infusion  . ceFAZolin (ANCEF) IVPB 2g/100 mL premix    Order Specific Question:   Indication:    Answer:   Surgical Prophylaxis    Assessment/Plan: Beth Bray is a 25 y.o. female, G2P1001, IUP at 39.1 weeks, presenting for repeat LTCS for LGA 97%. Pt endorse + Fm. Denies vaginal leakage. Denies vaginal bleeding. Denies feeling cxt's. FT 152.   Prenatal Problems: Asthma (stable with PRN inhaler)  COVID-19 (Dx 12/3, received monoclonal antibodies) depressive disorder/Anxiety disorder (Rx Zoloft at [facility], referral to [facility] for counseling 04/07/20.) history of cesarean  section (08/2018, arrest of dilation; pp anemia, blood transfusion) large for gestation age fetus (first baby 9.3lbs, this pregnancy 97% on 2/16)   Plan: Admit to pre-op per consult with Su Hilt Routine [facility] pre-op orders Pain med/epidural spinal SCD Bicitra Clip site Ancef 2 gm Foley.   [facility] NP-C, CNM, MSN 10/29/2020, 2:48 PM  Risks benefits alternatives reviewed with the patient including but not limited to bleeding, infection and injury, patient and FOB verbalized understanding, questions answered and consent signed and witnessed.

## 2020-10-29 NOTE — Transfer of Care (Signed)
Immediate Anesthesia Transfer of Care Note  Patient: Beth Bray  Procedure(s) Performed: CESAREAN SECTION (N/A )  Patient Location: PACU  Anesthesia Type:Spinal  Level of Consciousness: awake, alert  and oriented  Airway & Oxygen Therapy: Patient Spontanous Breathing and Patient connected to nasal cannula oxygen  Post-op Assessment: Report given to RN and Post -op Vital signs reviewed and stable  Post vital signs: Reviewed and stable  Last Vitals:  Vitals Value Taken Time  BP 91/57 10/29/20 1650  Temp    Pulse 82 10/29/20 1653  Resp 19 10/29/20 1653  SpO2 98 % 10/29/20 1653  Vitals shown include unvalidated device data.  Last Pain:  Vitals:   10/29/20 1351  TempSrc: Oral  PainSc: 0-No pain         Complications: No complications documented.

## 2020-10-29 NOTE — Lactation Note (Signed)
This note was copied from a baby's chart. Lactation Consultation Note  Patient Name: Beth Bray Today's Date: 10/29/2020 Reason for consult: Initial assessment Age:23 hours   LC Initial Consult:  Per RN, mother declines a lactation consult at this time, however, she would like to be seen later this evening.   Maternal Data    Feeding    LATCH Score Latch: Repeated attempts needed to sustain latch, nipple held in mouth throughout feeding, stimulation needed to elicit sucking reflex.  Audible Swallowing: None  Type of Nipple: Flat (short shaft)  Comfort (Breast/Nipple): Soft / non-tender  Hold (Positioning): Assistance needed to correctly position infant at breast and maintain latch.  LATCH Score: 5   Lactation Tools Discussed/Used    Interventions Interventions: Assisted with latch;Skin to skin;Breast feeding basics reviewed;Breast compression  Discharge    Consult Status Consult Status: Follow-up Date: 10/29/20 Follow-up type: In-patient    Dora Sims 10/29/2020, 6:15 PM

## 2020-10-29 NOTE — Anesthesia Procedure Notes (Signed)
Spinal  Patient location during procedure: OR Start time: 10/29/2020 3:15 PM End time: 10/29/2020 3:25 PM Staffing Performed: anesthesiologist  Anesthesiologist: Lannie Fields, DO Preanesthetic Checklist Completed: patient identified, IV checked, risks and benefits discussed, surgical consent, monitors and equipment checked, pre-op evaluation and timeout performed Spinal Block Patient position: sitting Prep: DuraPrep and site prepped and draped Patient monitoring: cardiac monitor, continuous pulse ox and blood pressure Approach: midline Location: L3-4 Injection technique: single-shot Needle Needle type: Pencan  Needle gauge: 24 G Needle length: 9 cm Assessment Sensory level: T6 Additional Notes Functioning IV was confirmed and monitors were applied. Sterile prep and drape, including hand hygiene and sterile gloves were used. The patient was positioned and the spine was prepped. The skin was anesthetized with lidocaine.  Free flow of clear CSF was obtained prior to injecting local anesthetic into the CSF.  The spinal needle aspirated freely following injection.  The needle was carefully withdrawn.  The patient tolerated the procedure well.

## 2020-10-30 DIAGNOSIS — D509 Iron deficiency anemia, unspecified: Secondary | ICD-10-CM | POA: Diagnosis present

## 2020-10-30 LAB — CBC
HCT: 25.8 % — ABNORMAL LOW (ref 36.0–46.0)
Hemoglobin: 7.7 g/dL — ABNORMAL LOW (ref 12.0–15.0)
MCH: 22.1 pg — ABNORMAL LOW (ref 26.0–34.0)
MCHC: 29.8 g/dL — ABNORMAL LOW (ref 30.0–36.0)
MCV: 74.1 fL — ABNORMAL LOW (ref 80.0–100.0)
Platelets: 228 10*3/uL (ref 150–400)
RBC: 3.48 MIL/uL — ABNORMAL LOW (ref 3.87–5.11)
RDW: 16.9 % — ABNORMAL HIGH (ref 11.5–15.5)
WBC: 10.7 10*3/uL — ABNORMAL HIGH (ref 4.0–10.5)
nRBC: 0 % (ref 0.0–0.2)

## 2020-10-30 MED ORDER — SODIUM CHLORIDE 0.9 % IV SOLN
500.0000 mg | Freq: Once | INTRAVENOUS | Status: AC
  Administered 2020-10-30: 500 mg via INTRAVENOUS
  Filled 2020-10-30: qty 25

## 2020-10-30 MED ORDER — SERTRALINE HCL 50 MG PO TABS
50.0000 mg | ORAL_TABLET | Freq: Every day | ORAL | Status: DC
  Administered 2020-10-30 – 2020-10-31 (×2): 50 mg via ORAL
  Filled 2020-10-30 (×3): qty 1

## 2020-10-30 MED ORDER — LACTATED RINGERS IV BOLUS
1000.0000 mL | Freq: Once | INTRAVENOUS | Status: AC
  Administered 2020-10-30: 1000 mL via INTRAVENOUS

## 2020-10-30 NOTE — Progress Notes (Signed)
Subjective: POD# 1 Elective repeat C/S Information for the patient's newborn:  Jael, [place] [[id number]]  female    Baby's Name [facility] Circumcision N/A  Reports feeling "very good, glad I had another section". Discussed anemia and pt agrees to IV iron.  Feeding: breast Reports tolerating PO and denies N/V, foley removed, ambulating and urinating w/o difficulty  Pain controlled with PO meds Denies HA/SOB/dizziness  Flatus not passing Vaginal bleeding is normal, no clots     Objective:  VS:  Vitals:   10/29/20 1900 10/29/20 2330 10/30/20 0330 10/30/20 0752  BP: (!) 100/53 93/60 (!) 102/51 (!) 89/68  Pulse: 70 73 66 (!) 57  Resp: 17 18 18 20   Temp: (!) 96.5 F (35.8 C) 97.7 F (36.5 C) 97.8 F (36.6 C) 97.9 F (36.6 C)  TempSrc: Axillary Oral Axillary Oral  SpO2:  99% 99% 99%  Weight:      Height:        Intake/Output Summary (Last 24 hours) at 10/30/2020 0920 Last data filed at 10/30/2020 0756 Gross per 24 hour  Intake 1670 ml  Output 3250 ml  Net -1580 ml     Recent Labs    10/29/20 1739 10/30/20 0515  WBC 6.5 10.7*  HGB 7.9* 7.7*  HCT 27.2* 25.8*  PLT 205 228    Blood type: --/--/O POS (02/17 1143) Rubella: Immune (07/28 0000)    Physical Exam:  General: alert, cooperative and no distress CV: Regular rate and rhythm Resp: clear Abdomen: soft, nontender, tympany in upper quads, hypoactive BS Incision: clean, dry, intact and pressure dressing removed, no drainage on honeycomb  Perineum:  Uterine Fundus: firm, below umbilicus, nontender Lochia: appropriate, no clots Ext: negative for edema, tenderness, pain, or cords   Assessment/Plan: 25 y.o.   POD# 1. 23                  Active Problems:   Encounter for maternal care for low transverse scar from repeat cesarean delivery   IDA (iron deficiency anemia)   Routine post-op PP care   IV Venofer for IDA        Advance diet as tolerated Advised warm fluids and ambulation to improve GI  motility Encourage rest when baby rests Breastfeeding support Anticipate D/C 10/31/20   11/02/20, MSN, CNM 10/30/2020, 9:20 AM

## 2020-10-30 NOTE — Anesthesia Postprocedure Evaluation (Signed)
Anesthesia Post Note  Patient: Camera operator  Procedure(s) Performed: CESAREAN SECTION (N/A )     Patient location during evaluation: PACU Anesthesia Type: Spinal Level of consciousness: awake and alert and oriented Pain management: pain level controlled Vital Signs Assessment: post-procedure vital signs reviewed and stable Respiratory status: spontaneous breathing, nonlabored ventilation and respiratory function stable Cardiovascular status: blood pressure returned to baseline and stable Postop Assessment: no headache, no backache, spinal receding and patient able to bend at knees Anesthetic complications: no   No complications documented.  Last Vitals:  Vitals:   10/29/20 2330 10/30/20 0330  BP: 93/60 (!) 102/51  Pulse: 73 66  Resp: 18 18  Temp: 36.5 C 36.6 C  SpO2: 99% 99%    Last Pain:  Vitals:   10/30/20 0330  TempSrc: Axillary  PainSc:    Pain Goal: Patients Stated Pain Goal: 8 (10/29/20 1745)                 Lannie Fields

## 2020-10-30 NOTE — Lactation Note (Addendum)
This note was copied from a baby's chart. Lactation Consultation Note  Patient Name: Beth Bray SWHQP'R Date: 10/30/2020 Reason for consult: Follow-up assessment;Term;Infant weight loss (Infant with weight loss of -2%.) Age:23 hours P2, term female infant, DAT+. Infant had one stool and 3 voids today. Mom been using DEBP due infant being DAT+ Per mom, infant is now latching to both breast without difficulties,  most feedings are now 30 minutes to 50 minutes in length and  infant is currently cluster feeding. LC entered the room, mom was breastfeeding infant on her left breast using the cradle hold position, infant was latched with depth, swallows observed, LC did not assist with latch, infant was still BF after 12 minutes when LC left the room. Per mom, she is only feeling a tug with latch but would like coconut oil due to breast tenderness. LC alerted RN who will give mom coconut oil.  Mom will continue to BF infant according to cues, 8 to 12+ or more times within 24 hours and understands infant is cluster feeding.  Mom knows to call RN or LC if she has questions, concerns or needs assistance with latching infant at the breast. Maternal Data    Feeding Mother's Current Feeding Choice: Breast Milk  LATCH Score Latch: Grasps breast easily, tongue down, lips flanged, rhythmical sucking.  Audible Swallowing: Spontaneous and intermittent  Type of Nipple: Everted at rest and after stimulation  Comfort (Breast/Nipple): Soft / non-tender  Hold (Positioning): No assistance needed to correctly position infant at breast.  LATCH Score: 10   Lactation Tools Discussed/Used    Interventions Interventions: Skin to skin;Breast massage;Breast compression  Discharge    Consult Status Consult Status: Follow-up Date: 10/31/20 Follow-up type: In-patient    [name] 10/30/2020, 6:37 PM

## 2020-10-30 NOTE — Clinical Social Work Maternal (Addendum)
CLINICAL SOCIAL WORK MATERNAL/CHILD NOTE  Patient Details  Name: Beth Bray MRN: [id number] Date of Birth: 02-20-1996  Date:  10/30/2020  Clinical Social Worker Initiating Note:  Abundio Miu, [place] Date/Time: Initiated:  10/30/20/1344     Child's Name:  Beth Bray   Biological Parents:  Mother,Father (Father: Tanya Nones)   Need for Interpreter:  None   Reason for Referral:  [place] Substance Use/Substance Use During Pregnancy    Address: [address] [place], [place] [address]   Phone number:  [phone] (home)     Additional phone number:   Household Members/Support Persons (HM/SP):   Household Member/Support Person 1,Household Member/Support Person 2   HM/SP Name Relationship DOB or Age  HM/SP -1 Tanya Nones FOB    HM/SP -2 Richele Strand son 08/12/2018  HM/SP -3        HM/SP -4        HM/SP -5        HM/SP -6        HM/SP -7        HM/SP -8          Natural Supports (not living in the home):  Parent   Professional Supports: None   Employment: Unemployed   Type of Work:     Education:  Programmer, systems   Homebound arranged:    Museum/gallery curator Resources:  Medicaid   Other Resources:  [place] Considerations Which May Impact Care:    Strengths:  Ability to meet basic needs ,Pediatrician chosen,Home prepared for child    Psychotropic Medications:         Pediatrician:    [facility] area  Pediatrician List:   Lavella Hammock, [place]      Pediatrician Fax Number:    Risk Factors/Current Problems:  Substance Use ,Mental Health Concerns    Cognitive State:  Able to Concentrate ,Alert ,Insightful ,Linear Thinking ,Goal Oriented    Mood/Affect:  Calm ,Interested ,Comfortable    CSW Assessment: CSW met with MOB at bedside to discuss behavioral health concerns and substance use  during pregnancy. CSW introduced self and explained reason for consult. MOB was welcoming, pleasant, open and remained engaged during assessment. MOB reported that she resides with FOB and son. MOB reported that she receives [employer] and food stamps. MOB reported that she has all essential items needed to care for infant including a car seat and basinet. MOB shared that she plans to buy more stuff for infant. CSW inquired about MOB's support system, MOB reported that her mom is a support.   CSW inquired about MOB's mental health history. MOB reported that she was diagnosed with anxiety and depression in 2016. MOB endorsed symptoms of anxiety and reported that she has anxiety attacks. MOB reported that she had anxiety attacks daily during pregnancy and denied being aware of any triggers. MOB reported that she is not taking any medication nor participating in therapy to treat her mental health diagnoses. CSW inquired about MOB's coping skills, MOB reported none. CSW asked if MOB was interested in therapy resources, MOB reported yes. CSW provided MOB with local mental health resources and encouraged MOB to follow up. MOB reported that she experienced postpartum depression with her son, that started a couple days after giving birth. MOB described her postpartum depression as being sad, crying and isolating. MOB shared that she  feels she is still experiencing postpartum depression. CSW encouraged MOB to follow up with OB provider about treatment options, MOB agreed. CSW emphasized the importance of following up and explained that postpartum depression is treatable, MOB verbalized understanding and agreed to follow up. CSW inquired about how MOB was feeling emotionally after giving birth, MOB reported that she was feeling okay and not stressed about anything. MOB reported that she feels attached and bonded with infant. MOB denied any current stressors. MOB presented calm and did not demonstrate any acute mental health  signs/symptoms. CSW assessed for safety, MOB denied SI, HI and domestic violence. CSW reiterated the importance of MOB following up with resources provided and OB provider.   CSW provided education regarding the baby blues period vs. perinatal mood disorders, discussed treatment and gave resources for mental health follow up if concerns arise.  CSW recommends self-evaluation during the postpartum time period using the New Mom Checklist from Postpartum Progress and encouraged MOB to contact a medical professional if symptoms are noted at any time.    CSW provided review of Sudden Infant Death Syndrome (SIDS) precautions.    CSW informed MOB about the hospital drug screen policy due to substance use during pregnancy. Per chart review MOB had a positive UDS for THC on 07/06/20. MOB confirmed marijuana use during pregnancy and reported last use as months ago, MOB was unable to recall the exact date. MOB denied any other substance use during pregnancy. CSW informed MOB that infant's UDS and CDS would continue to be monitored and a CPS report would be made if warranted. MOB verbalized understanding and denied any CPS history. MOB denied any questions/concerns.   CSW identifies no further need for intervention and no barriers to discharge at this time.   CSW Plan/Description:  No Further Intervention Required/No Barriers to Discharge,Sudden Infant Death Syndrome (SIDS) Education,Perinatal Mood and Anxiety Disorder (PMADs) Education,CSW Will Continue to Monitor Umbilical Cord Tissue Drug Screen Results and Make Report if [facility] Drug Screen Policy Information,Other Information/Referral to [employer], [place] 10/30/2020, 1:46 PM

## 2020-10-31 DIAGNOSIS — D62 Acute posthemorrhagic anemia: Secondary | ICD-10-CM | POA: Diagnosis not present

## 2020-10-31 LAB — POCT I-STAT EG7
Acid-base deficit: 3 mmol/L — ABNORMAL HIGH (ref 0.0–2.0)
Bicarbonate: 21.3 mmol/L (ref 20.0–28.0)
Calcium, Ion: 1.22 mmol/L (ref 1.15–1.40)
HCT: 24 % — ABNORMAL LOW (ref 36.0–46.0)
Hemoglobin: 8.2 g/dL — ABNORMAL LOW (ref 12.0–15.0)
O2 Saturation: 96 %
Potassium: 3.9 mmol/L (ref 3.5–5.1)
Sodium: 139 mmol/L (ref 135–145)
TCO2: 22 mmol/L (ref 22–32)
pCO2, Ven: 33.9 mmHg — ABNORMAL LOW (ref 44.0–60.0)
pH, Ven: 7.405 (ref 7.250–7.430)
pO2, Ven: 77 mmHg — ABNORMAL HIGH (ref 32.0–45.0)

## 2020-10-31 LAB — BIRTH TISSUE RECOVERY COLLECTION (PLACENTA DONATION)

## 2020-10-31 MED ORDER — ACETAMINOPHEN 325 MG PO TABS
650.0000 mg | ORAL_TABLET | Freq: Four times a day (QID) | ORAL | Status: DC
  Administered 2020-10-31 – 2020-11-01 (×2): 650 mg via ORAL
  Filled 2020-10-31 (×3): qty 2

## 2020-10-31 MED ORDER — IBUPROFEN 600 MG PO TABS
600.0000 mg | ORAL_TABLET | Freq: Four times a day (QID) | ORAL | Status: DC
  Administered 2020-10-31 – 2020-11-01 (×2): 600 mg via ORAL
  Filled 2020-10-31 (×3): qty 1

## 2020-10-31 MED ORDER — IBUPROFEN 600 MG PO TABS: 600.0000 mg | ORAL_TABLET | Freq: Four times a day (QID) | ORAL | Status: DC

## 2020-10-31 NOTE — Progress Notes (Addendum)
Beth Bray [id number] Postpartum Postoperative Day # 2  Lexine Baton, [id number], [redacted]w[redacted]d, S/P Repeat LT Cesarean Section due to elective and LGA 97% at 39.1wg on 2/18 with Dr Su Hilt, Drue Novel was 1,085mls, hgb drop of 8.7-7.7, asymptomatic, s/p 1 dose IV venofer on 2/19 and now on PO Iron, stable. Baby Female. Has H/O anxiety/depression, denies SI/HI, mood stable on zoloft 50mg  PO daily, has referral to [facility] for counseling.   Subjective: Patient up ad lib, denies syncope or dizziness. Reports consuming regular diet without issues and denies N/V. Patient reports 0 bowel movement + passing flatus.  Denies issues with urination and reports bleeding is "lighter."  Patient is breastfeeding and reports going ok, but painful and bottle fed x1 last night, pt endorses working with lactation and I encouraged her to continue.  Desires undecided for postpartum contraception.  Pain is being appropriately managed with use of po meds, more pain in abdomen today and will ask for pain meds after eating breakfast this morning. Pt appears happy and content, denies SI/HI. Pt desires to continue zoloft for anxiety and depression. Pt denies symptoms of anemia.    Objective: Patient Vitals for the past 24 hrs:  BP Temp Temp src Pulse Resp SpO2  10/31/20 0630 (!) 98/59 98.5 F (36.9 C) Oral 96 17 97 %  10/30/20 2139 91/60 98.4 F (36.9 C) Axillary 61 16 --  10/30/20 1300 (!) 103/59 98.5 F (36.9 C) Oral 80 18 98 %  10/30/20 0752 (!) 89/68 97.9 F (36.6 C) Oral (!) 57 20 99 %     Physical Exam:  General: alert, cooperative, appears stated age and no distress Mood/Affect: fatigue, but content  Lungs: clear to auscultation, no wheezes, rales or rhonchi, symmetric air entry.  Heart: normal rate, regular rhythm, normal S1, S2, no murmurs, rubs, clicks or gallops. Breast: breasts appear normal, no suspicious masses, no skin or nipple changes or axillary nodes. Abdomen:  + bowel sounds, soft, non-tender Incision: healing  well, no significant drainage, no dehiscence, no significant erythema, Honeycomb dressing  Uterine Fundus: firm, involution -1 Lochia: appropriate Skin: Warm, Dry. DVT Evaluation: No evidence of DVT seen on physical exam. Negative Homan's sign. No cords or calf tenderness.  Labs: Recent Labs    10/28/20 1138 10/29/20 1644 10/29/20 1739 10/30/20 0515  HGB 8.7* 8.2* 7.9* 7.7*  HCT 29.3* 24.0* 27.2* 25.8*  WBC 6.2  --  6.5 10.7*    CBG (last 3)  No results for input(s): GLUCAP in the last 72 hours.   I/O: I/O last 3 completed shifts: In: 620 [P.O.:620] Out: 2450 [Urine:2450]   Assessment Postpartum Postoperative Day # 2  11/01/20, G2P2002, [redacted]w[redacted]d, S/P Repeat LT Cesarean Section due to elective and LGA 97% at 39.1wg on 2/18 with Dr 3/18, Su Hilt was 1,078mls, hgb drop of 8.7-7.7, asymptomatic, s/p 1 dose IV venofer on 2/19 and now on PO Iron, stable. Baby Female. Has H/O anxiety/depression, denies SI/HI, mood stable on zoloft 50mg  PO daily, has referral to [facility] for counseling.  Pt stable. -1 Involution. Breast and bottle Feeding. Hemodynamically Stable.  Plan: Continue other mgmt as ordered H/O anxiety/depression: continue zoloft 50mg  PO daily, f/u PP with [place], monitor EPDS. Anemia: PO Iorn daily.  VTE Prophylactics: SCD, ambulated as tolerates.  Pain control: Motrin/Tylenol/Narcotics PRN Education given regarding options for contraception, including barrier methods, injectable contraception, IUD placement, oral contraceptives.  Plan for discharge tomorrow, Breastfeeding and Lactation consult  Dr. to be updated on patient status  [facility] NP-C,  CNM 10/31/2020, 7:44 AM

## 2020-10-31 NOTE — Lactation Note (Signed)
This note was copied from a baby's chart. Lactation Consultation Note  Patient Name: Beth Bray [id number] Date: 10/31/2020 Reason for consult: Follow-up assessment;Term;Infant weight loss;Other (Comment) (DAT (+)) Age:23 hours  Visited with mom of 45 hours old FT female, she's a P2. Mom and baby have discharge order but she voiced she's still in pain and might need to stay another day. She also told LC that her left nipple was bleeding.  LC and LC student Beth Bray assisted mom with hand expression and she was able to get some droplets of colostrum, LC rubbed them on mom's nipples. She also has coconut oil, instructed her to use it prior pumping and whenever she cannot get enough colostrum for breast care. Right nipple looks flat and without any signs of trauma, and left one has scabbing and mom voiced it's painful, she won't be putting baby to breast on the left side.  Offered assistance with latch, but mom politely declined, she voiced baby already fed, but that she would like to have assistance on the next feeding; just to make sure baby's latch is not causing her sore nipples. Asked mom to call her RN to page [place]. Reviewed normal newborn behavior, feeding cues,  Cluster feeding, prevention/treatment of sore nipples and supplementation guidelines. Baby was cluster feeding last night and she used donor milk. Asked mom to let her RN know if she needed more, she's aware of baby's DAT (+) status and that is currently below threshold level.   Reviewed discharge instructions, mom aware that is she stays another day at the hospital, she can always call LC for assistance.  Feeding plan:  1. Encouraged mom to feed baby STS 8-12 times/24 hours or sooner if feeding cues are present 2. Hand expression and breast massage were also encouraged 3. Mom will continue pumping and will request donor milk to her RN if needed  No support person in mom's room at the time of [facility] consultation. Mom reported all  questions and concerns were answered, she's aware of LC OP services and will call PRN.   Maternal Data    Feeding Mother's Current Feeding Choice: Breast Milk   Lactation Tools Discussed/Used Tools: Coconut oil  Interventions Interventions: Breast feeding basics reviewed;Breast massage;Hand express;Breast compression;Coconut oil  Discharge Discharge Education: Engorgement and breast care;Warning signs for feeding baby  Consult Status Consult Status: PRN Date: 11/01/20 Follow-up type: In-patient    Beth Bray 10/31/2020, 12:51 PM

## 2020-10-31 NOTE — Progress Notes (Signed)
CSW acknowledged consult and completed chart review. MOB met with a CSW (Kim L) on  10/30/2020 (see clinical social work note) and PMAD education was provided.   There are no barriers to discharge.    Laurey Arrow, MSW, LCSW Clinical Social Work [phone]

## 2020-11-01 LAB — TYPE AND SCREEN
ABO/RH(D): O POS
Antibody Screen: NEGATIVE
Unit division: 0
Unit division: 0
Unit division: 0
Unit division: 0

## 2020-11-01 LAB — BPAM RBC
Blood Product Expiration Date: 202203182359
Blood Product Expiration Date: 202203192359
Blood Product Expiration Date: 202203212359
Blood Product Expiration Date: 202203212359
Unit Type and Rh: 5100
Unit Type and Rh: 5100
Unit Type and Rh: 5100
Unit Type and Rh: 5100

## 2020-11-01 MED ORDER — SERTRALINE HCL 50 MG PO TABS: 50.0000 mg | ORAL_TABLET | Freq: Every day | ORAL | 2 refills | Status: DC

## 2020-11-01 MED ORDER — FERROUS SULFATE 325 (65 FE) MG PO TABS: 325.0000 mg | ORAL_TABLET | Freq: Two times a day (BID) | ORAL | 2 refills | Status: DC

## 2020-11-01 MED ORDER — ACETAMINOPHEN 325 MG PO TABS: 650.0000 mg | ORAL_TABLET | Freq: Four times a day (QID) | ORAL | Status: DC

## 2020-11-01 MED ORDER — OXYCODONE HCL 5 MG PO TABS: 5.0000 mg | ORAL_TABLET | Freq: Four times a day (QID) | ORAL | 0 refills | Status: AC | PRN

## 2020-11-01 MED ORDER — IBUPROFEN 600 MG PO TABS: 600.0000 mg | ORAL_TABLET | Freq: Four times a day (QID) | ORAL | 0 refills | Status: DC

## 2020-11-01 NOTE — Discharge Summary (Signed)
Postpartum Discharge Summary  Date of Service updated 11/01/20    Patient Name: Beth Bray DOB: 1995/12/23 MRN: [id number]  Date of admission: 10/29/2020 Delivery date:10/29/2020  Delivering provider: Everett Graff  Date of discharge: 11/01/2020  Admitting diagnosis: Encounter for maternal care for low transverse scar from repeat cesarean delivery [O34.211] Intrauterine pregnancy: Unknown     Secondary diagnosis:  Active Problems:   Encounter for maternal care for low transverse scar from repeat cesarean delivery   IDA (iron deficiency anemia)   PPH (postpartum hemorrhage)   Acute blood loss anemia   Normal postpartum course  Additional problems: none    Discharge diagnosis: Term Pregnancy Delivered                                              Post partum procedures:IV iron infusion Augmentation: N/A Complications: [id number]  Hospital course: Scheduled C/S   25 y.o. yo G2P2002 at Unknown was admitted to the hospital 10/29/2020 for scheduled cesarean section with the following indication:Elective Repeat and Macrosomia.Delivery details are as follows:  Membrane Rupture Time/Date: 3:49 PM ,10/29/2020   Delivery Method:C-Section, Low Transverse  Details of operation can be found in separate operative note.  Patient had an uncomplicated postpartum course.  She is ambulating, tolerating a regular diet, passing flatus, and urinating well. Patient is discharged home in stable condition on  11/01/20        Newborn Data: Birth date:10/29/2020  Birth time:3:49 PM  Gender:Female  Living status:Living  Apgars:8 ,9  MIWOEH:2122 g     Magnesium Sulfate received: No BMZ received: No Rhophylac:N/A MMR:N/A Transfusion:No  Physical exam  Vitals:   10/31/20 0630 10/31/20 1544 10/31/20 2120 11/01/20 0400  BP: (!) 98/59 90/67  107/63  Pulse: 96 83 74 92  Resp: '17 18  16  ' Temp: 98.5 F (36.9 C) 98.2 F (36.8 C) 97.7 F (36.5 C) 98.2 F (36.8 C)  TempSrc: Oral Oral Oral Oral   SpO2: 97% 100%  100%  Weight:      Height:       General: alert, cooperative and no distress Lochia: appropriate Uterine Fundus: firm Incision: Healing well with no significant drainage, honeycomb dressing dry and intact DVT Evaluation: No evidence of DVT seen on physical exam. No cords or calf tenderness. No significant calf/ankle edema. Labs: Lab Results  Component Value Date   WBC 10.7 (H) 10/30/2020   HGB 7.7 (L) 10/30/2020   HCT 25.8 (L) 10/30/2020   MCV 74.1 (L) 10/30/2020   PLT 228 10/30/2020   CMP Latest Ref Rng & Units 10/29/2020  Glucose 70 - 99 mg/dL -  BUN 6 - 20 mg/dL -  Creatinine 0.44 - 1.00 mg/dL -  Sodium 135 - 145 mmol/L 139  Potassium 3.5 - 5.1 mmol/L 3.9  Chloride 98 - 111 mmol/L -  CO2 22 - 32 mmol/L -  Calcium 8.9 - 10.3 mg/dL -  Total Protein 6.5 - 8.1 g/dL -  Total Bilirubin 0.3 - 1.2 mg/dL -  Alkaline Phos 38 - 126 U/L -  AST 15 - 41 U/L -  ALT 0 - 44 U/L -   Edinburgh Score: Edinburgh Postnatal Depression Scale Screening Tool 10/31/2020  I have been able to laugh and see the funny side of things. 1  I have looked forward with enjoyment to things. 0  I have blamed myself unnecessarily when  things went wrong. 3  I have been anxious or worried for no good reason. 2  I have felt scared or panicky for no good reason. 2  Things have been getting on top of me. 2  I have been so unhappy that I have had difficulty sleeping. 3  I have felt sad or miserable. 2  I have been so unhappy that I have been crying. 1  The thought of harming myself has occurred to me. 0  Edinburgh Postnatal Depression Scale Total 16      After visit meds:  Allergies as of 11/01/2020      Reactions   Kiwi Extract Swelling   Pineapple Other (See Comments)   Bumps and sores in mouth      Medication List    TAKE these medications   acetaminophen 325 MG tablet Commonly known as: TYLENOL Take 2 tablets (650 mg total) by mouth every 6 (six) hours.   albuterol 108 (90  Base) MCG/ACT inhaler Commonly known as: VENTOLIN HFA Inhale 3 puffs into the lungs every 4 (four) hours as needed for wheezing. What changed: how much to take   ferrous sulfate 325 (65 FE) MG tablet Take 1 tablet (325 mg total) by mouth 2 (two) times daily with a meal.   ibuprofen 600 MG tablet Commonly known as: ADVIL Take 1 tablet (600 mg total) by mouth every 6 (six) hours.   multivitamin-prenatal 27-0.8 MG Tabs tablet Take 1 tablet by mouth daily.   oxyCODONE 5 MG immediate release tablet Commonly known as: Oxy IR/ROXICODONE Take 1 tablet (5 mg total) by mouth every 6 (six) hours as needed for up to 5 days for moderate pain or severe pain.   sertraline 50 MG tablet Commonly known as: ZOLOFT Take 1 tablet (50 mg total) by mouth daily.        Discharge home in stable condition Infant Feeding: Breast Infant Disposition:home with mother Discharge instruction: per After Visit Summary and Postpartum booklet. Activity: Advance as tolerated. Pelvic rest for 6 weeks.  Diet: routine diet Anticipated Birth Control: Unsure and counseled on options Postpartum Appointment:6 weeks Additional Postpartum F/U: none Future Appointments:No future appointments. Follow up Visit:  Follow-up Information    Waymon Amato, MD. Schedule an appointment as soon as possible for a visit in 6 week(s).   Specialty: Obstetrics and Gynecology Contact information: [address] [place] [address] [phone]                   11/01/2020 Arrie Eastern, CNM

## 2020-11-01 NOTE — Lactation Note (Signed)
This note was copied from a baby's chart. Lactation Consultation Note  Patient Name: Beth Bray BZJIR'C Date: 11/01/2020 Reason for consult: Follow-up assessment Age:23 hours   P2 mother whose infant is now 38 hours old.  This is a term baby at 39+1 weeks.  Baby was awake and beginning to get fussy when I arrived.  Offered to assist with latching and mother agreeable.  Mother's left nipple has started to bleed slightly.  Mother concerned about baby getting the blood.  Explained that the blood will not hurt baby but we need to obtain a deep latch so there is minimal discomfort for mother.  Mother prefers the cradle hold, however, I offered to assist with the football hold for better support and comfort.  Baby was able to latch but still restless.  Mother sensitive but able to feed on that side.  Baby fed for a few minutes on/off.  Suggested mother attempt to burp; unsuccessful with burping.  Placed her on the right breast and baby burped.  Continued to feed on/off for another 7 minutes.  Reviewed breast feeding basics and how to position mother/baby for comfort.  Mother appreciative.  Suggested mother use EBM and coconut oil after every feeding.  Provided comfort gels with instructions for use.  Mother has been supplementing with donor milk and will call for more milk as needed.  Currently we have non available and I suggested continuing to allow baby to feed on the right side.  Mother is familiar with hand expression.  Engorgement prevention/treatment reviewed.  Mother has a manual pump at bedside.  She has our OP phone number for any concerns after discharge.  Father present and asleep on the couch.   Maternal Data    Feeding    LATCH Score Latch: Grasps breast easily, tongue down, lips flanged, rhythmical sucking.  Audible Swallowing: A few with stimulation  Type of Nipple: Everted at rest and after stimulation  Comfort (Breast/Nipple): Filling, red/small blisters or bruises,  mild/mod discomfort  Hold (Positioning): Assistance needed to correctly position infant at breast and maintain latch.  LATCH Score: 7   Lactation Tools Discussed/Used    Interventions Interventions: Breast feeding basics reviewed;Assisted with latch;Breast massage;Hand express;Breast compression;Adjust position;Hand pump;Position options;Support pillows;Education  Discharge    Consult Status Consult Status: Complete Date: 11/01/20 Follow-up type: Call as needed    Irene Pap DelFava 11/01/2020, 8:55 AM

## 2020-11-05 ENCOUNTER — Telehealth (HOSPITAL_COMMUNITY): Payer: Self-pay | Admitting: Lactation Services

## 2020-11-05 NOTE — Telephone Encounter (Signed)
This patient called at 1052, LC returned the call , baby was crying and LC asked her if there would be a better time to be called back.  This is the 2nd call back to mom and per mom I have a baby weight check at 2:15pm.  LC asked her to call back so her original concern of pain in her breast , red sore nipples could be discussed.

## 2020-11-06 ENCOUNTER — Telehealth (HOSPITAL_COMMUNITY): Payer: Self-pay

## 2020-11-06 NOTE — Telephone Encounter (Signed)
OB from [place] called and requested Lactation call the patient.  The call was made and a voicemail was left.   Mom had cracked and painful nipples per OB.

## 2020-11-07 NOTE — Telephone Encounter (Signed)
LC attempt to call mom back again but phone doesn't even ring, it went straight to voicemail. LC left another message asking her to call back to get in touch with lactation, explained that she'll need to see an LC in person, asked where she takes baby to the pediatrician to see if they have an LC on site. Waiting for mom to call us back.

## 2021-03-14 IMAGING — US US OB TRANSVAGINAL
1 series · 15 of 28 positions shown · non-contrast
Comparison: Obstetrical ultrasound 02/24/2020

CLINICAL DATA: Pain

EXAM:
TRANSVAGINAL OB ULTRASOUND
TECHNIQUE: Transvaginal ultrasound was performed for complete evaluation of the
gestation as well as the maternal uterus, adnexal regions, and
pelvic cul-de-sac.

[Series 1: us ob transvaginal · 41 acquisitions, 15 frames shown]
[im 1/41]
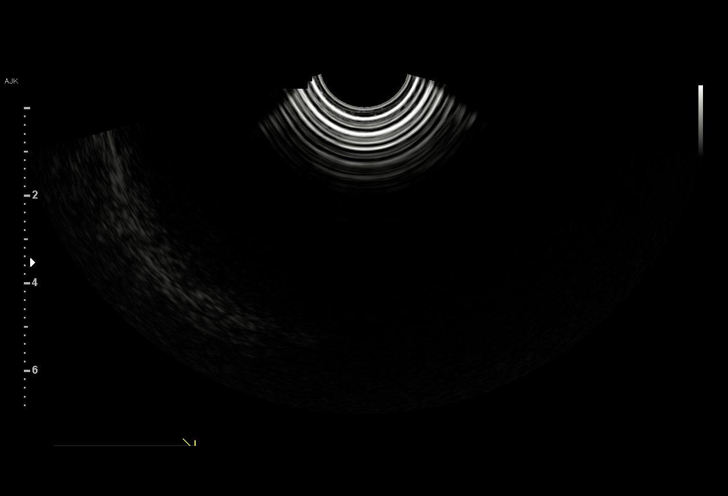
[im 3/41]
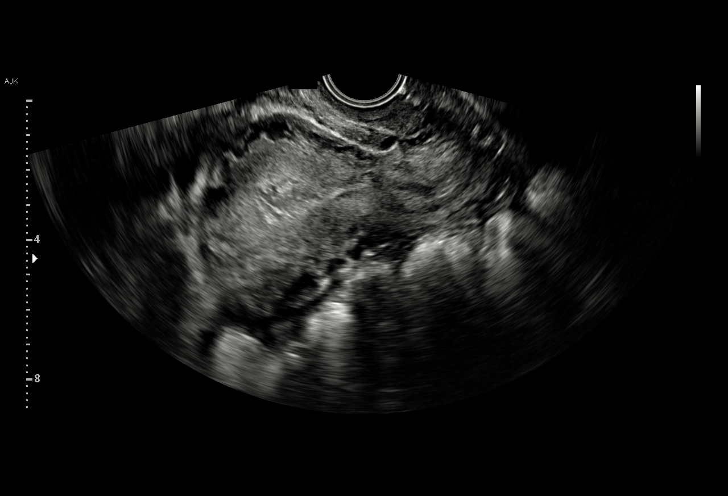
[im 6/41]
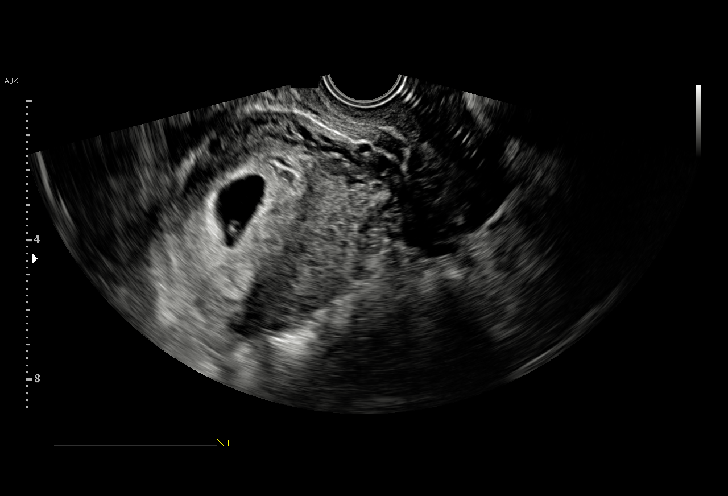
[im 9/41]
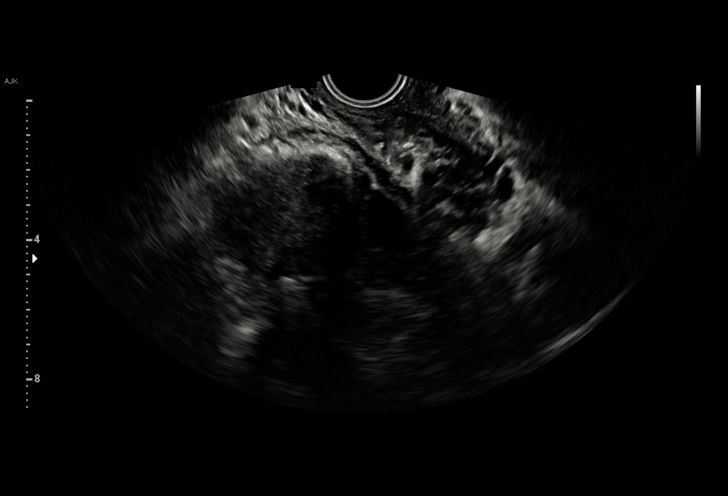
[im 12/41]
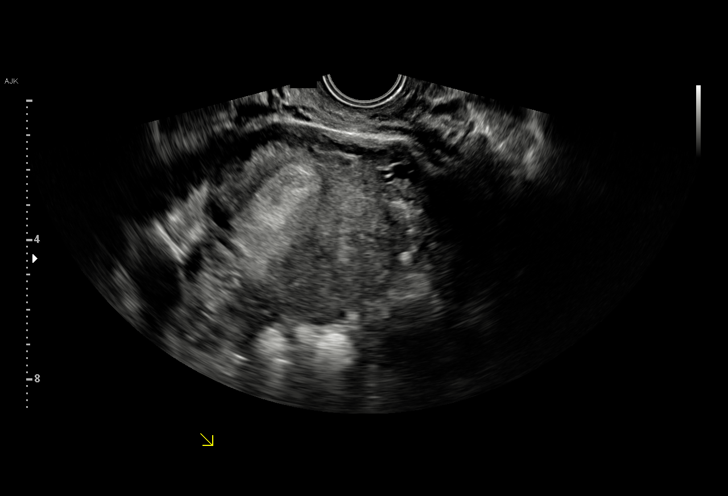
[im 15/41]
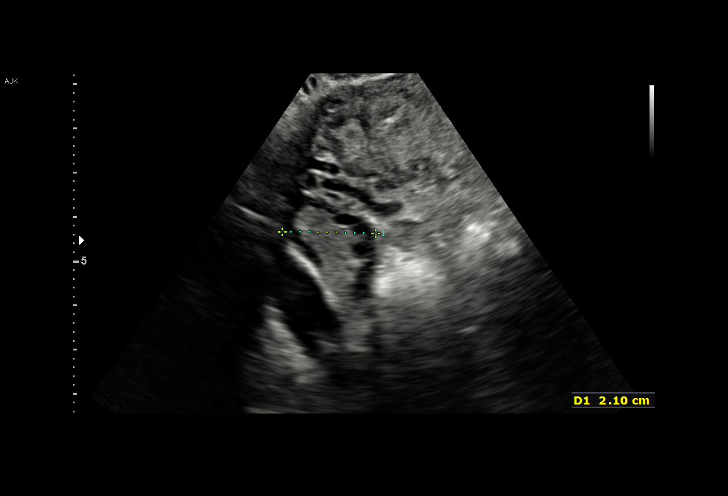
[im 18/41]
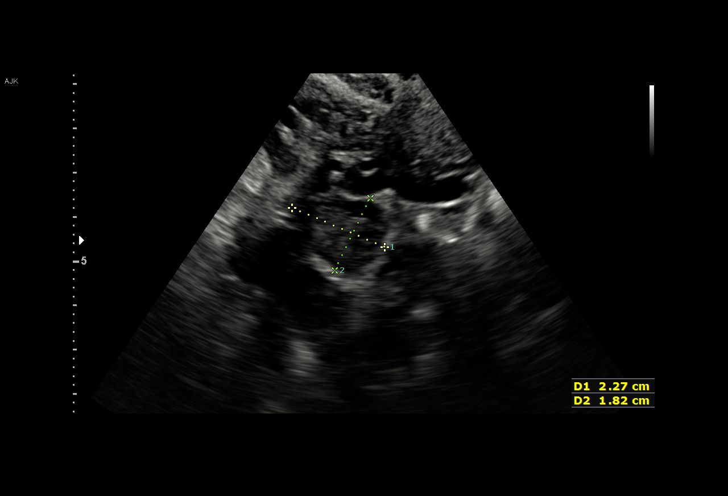
[im 21/41]
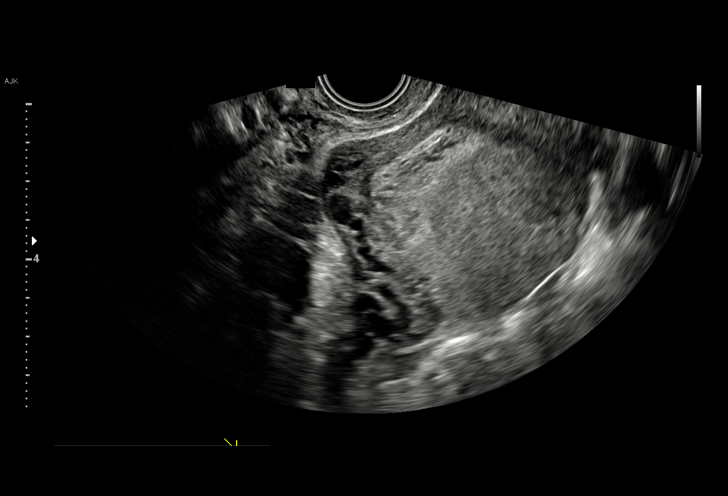
[im 23/41]
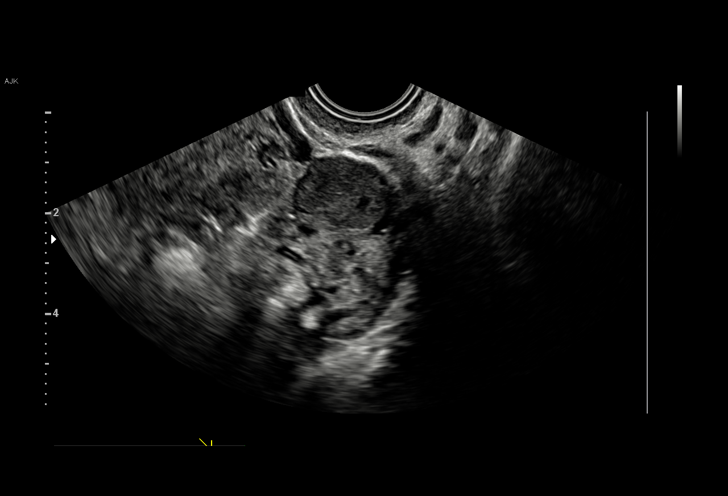
[im 26/41]
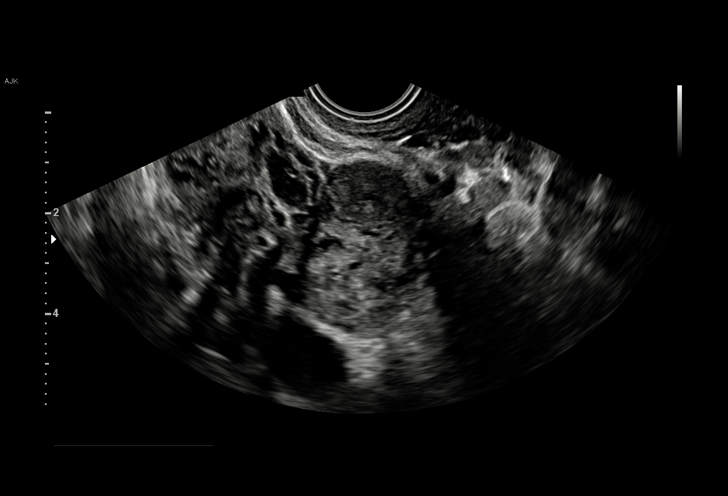
[im 29/41]
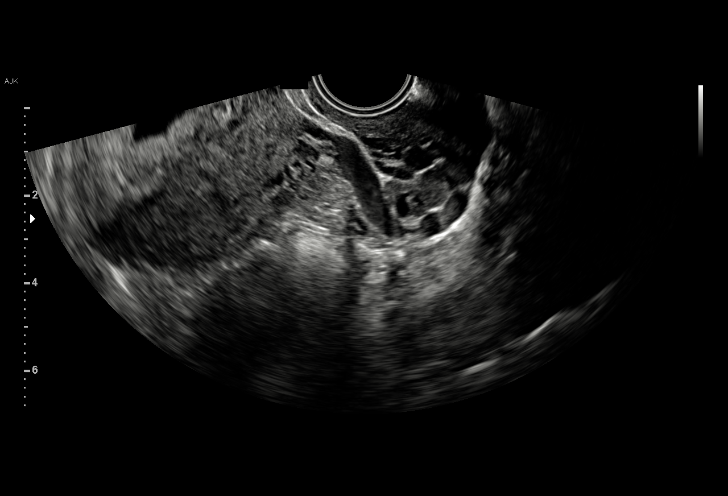
[im 32/41]
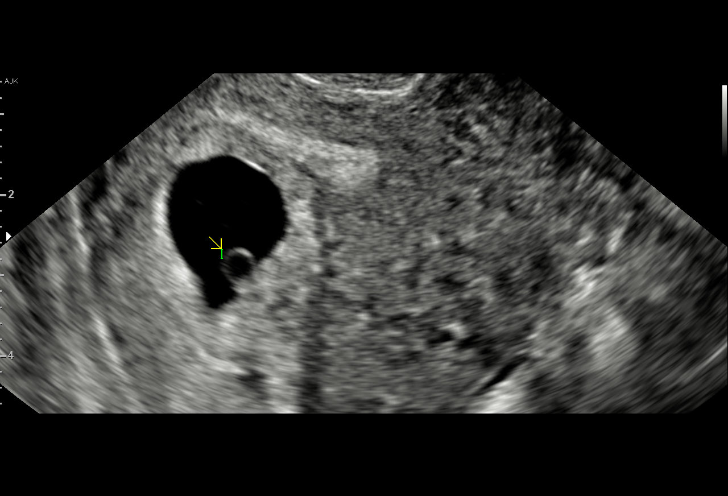
[im 35/41]
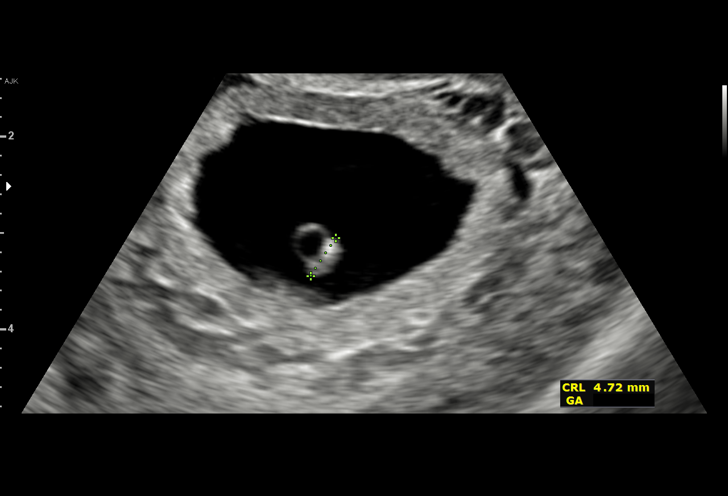
[im 38/41]
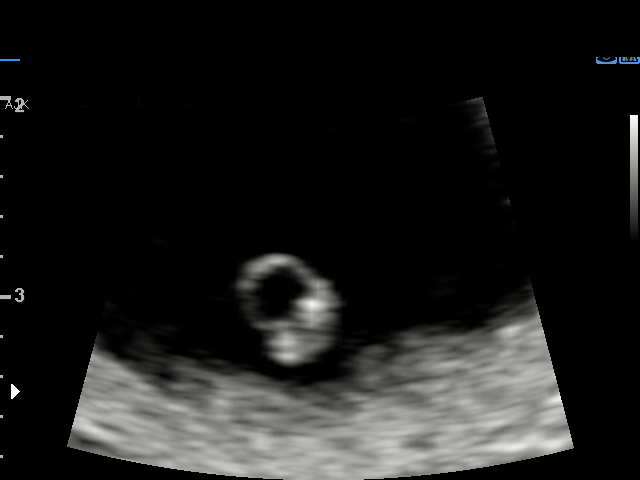
[im 41/41]
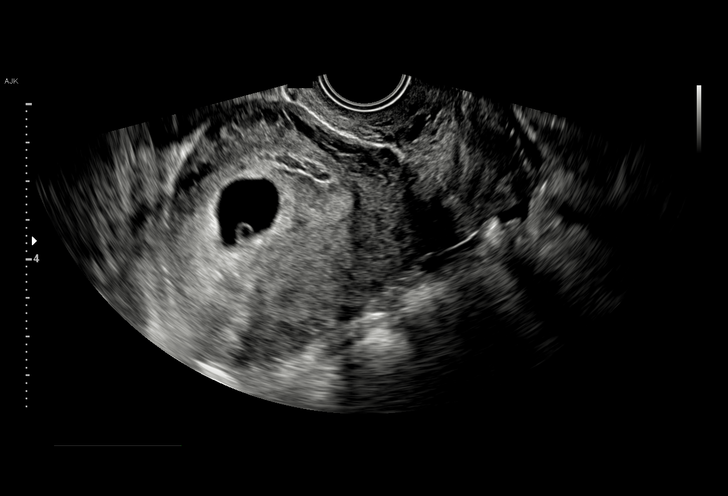

[15 of 28 positions shown; findings below may reference images not displayed]

FINDINGS: Intrauterine gestational sac: Single

Yolk sac:  Visualized.

Embryo:  Visualized.

Cardiac Activity: Visualized.

Heart Rate: 100 bpm

CRL:   4.6 mm   6 w 1 d                  US EDC: 11/04/2020

Subchorionic hemorrhage:  None visualized.

Maternal uterus/adnexae: Normal anteverted appearance of the
maternal uterus. Normal appearance of the ovaries. No pelvic free
fluid.
IMPRESSION: Single intrauterine gestation with a gestational age of 6 weeks, 1
day by crown-rump length sonographic estimation.

Fetal heart rate of 100 beats per minute is concerning for fetal
bradycardia. Consider obstetrical consultation.

No other acute or worrisome findings.

## 2021-06-15 ENCOUNTER — Inpatient Hospital Stay (HOSPITAL_COMMUNITY)
Admission: AD | Admit: 2021-06-15 | Discharge: 2021-06-15 | Disposition: A | Discharge status: Home / Self Care | Payer: Medicaid Other | Attending: Obstetrics and Gynecology | Admitting: Obstetrics and Gynecology

## 2021-06-15 ENCOUNTER — Encounter (HOSPITAL_COMMUNITY): Payer: Self-pay | Admitting: Obstetrics and Gynecology

## 2021-06-15 ENCOUNTER — Inpatient Hospital Stay (HOSPITAL_COMMUNITY): Payer: Medicaid Other

## 2021-06-15 ENCOUNTER — Other Ambulatory Visit: Payer: Self-pay

## 2021-06-15 DIAGNOSIS — O210 Mild hyperemesis gravidarum: Secondary | ICD-10-CM | POA: Diagnosis present

## 2021-06-15 DIAGNOSIS — R1084 Generalized abdominal pain: Secondary | ICD-10-CM | POA: Diagnosis not present

## 2021-06-15 DIAGNOSIS — O3680X0 Pregnancy with inconclusive fetal viability, not applicable or unspecified: Secondary | ICD-10-CM

## 2021-06-15 DIAGNOSIS — Z3A01 Less than 8 weeks gestation of pregnancy: Secondary | ICD-10-CM | POA: Diagnosis not present

## 2021-06-15 DIAGNOSIS — R112 Nausea with vomiting, unspecified: Secondary | ICD-10-CM

## 2021-06-15 DIAGNOSIS — Z7722 Contact with and (suspected) exposure to environmental tobacco smoke (acute) (chronic): Secondary | ICD-10-CM | POA: Diagnosis not present

## 2021-06-15 DIAGNOSIS — O219 Vomiting of pregnancy, unspecified: Secondary | ICD-10-CM | POA: Diagnosis not present

## 2021-06-15 DIAGNOSIS — R197 Diarrhea, unspecified: Secondary | ICD-10-CM | POA: Insufficient documentation

## 2021-06-15 DIAGNOSIS — O26891 Other specified pregnancy related conditions, first trimester: Secondary | ICD-10-CM

## 2021-06-15 DIAGNOSIS — R109 Unspecified abdominal pain: Secondary | ICD-10-CM

## 2021-06-15 DIAGNOSIS — O26899 Other specified pregnancy related conditions, unspecified trimester: Secondary | ICD-10-CM

## 2021-06-15 LAB — URINALYSIS, ROUTINE W REFLEX MICROSCOPIC
Bilirubin Urine: NEGATIVE
Glucose, UA: NEGATIVE mg/dL
Hgb urine dipstick: NEGATIVE
Ketones, ur: 5 mg/dL — AB
Nitrite: NEGATIVE
Protein, ur: NEGATIVE mg/dL
Specific Gravity, Urine: 1.016 (ref 1.005–1.030)
pH: 7 (ref 5.0–8.0)

## 2021-06-15 LAB — COMPREHENSIVE METABOLIC PANEL
ALT: 17 U/L (ref 0–44)
AST: 19 U/L (ref 15–41)
Albumin: 4 g/dL (ref 3.5–5.0)
Alkaline Phosphatase: 45 U/L (ref 38–126)
Anion gap: 8 (ref 5–15)
BUN: 7 mg/dL (ref 6–20)
CO2: 18 mmol/L — ABNORMAL LOW (ref 22–32)
Calcium: 9.3 mg/dL (ref 8.9–10.3)
Chloride: 108 mmol/L (ref 98–111)
Creatinine, Ser: 0.64 mg/dL (ref 0.44–1.00)
GFR, Estimated: >60 mL/min (ref >60)
Glucose, Bld: 105 mg/dL — ABNORMAL HIGH (ref 70–99)
Potassium: 3.6 mmol/L (ref 3.5–5.1)
Sodium: 134 mmol/L — ABNORMAL LOW (ref 135–145)
Total Bilirubin: 0.4 mg/dL (ref 0.3–1.2)
Total Protein: 6.9 g/dL (ref 6.5–8.1)

## 2021-06-15 LAB — CBC
HCT: 37 % (ref 36.0–46.0)
Hemoglobin: 12.4 g/dL (ref 12.0–15.0)
MCH: 26.6 pg (ref 26.0–34.0)
MCHC: 33.5 g/dL (ref 30.0–36.0)
MCV: 79.4 fL — ABNORMAL LOW (ref 80.0–100.0)
Platelets: 268 10*3/uL (ref 150–400)
RBC: 4.66 MIL/uL (ref 3.87–5.11)
RDW: 12.8 % (ref 11.5–15.5)
WBC: 9.9 10*3/uL (ref 4.0–10.5)
nRBC: 0 % (ref 0.0–0.2)

## 2021-06-15 LAB — HCG, QUANTITATIVE, PREGNANCY: hCG, Beta Chain, Quant, S: 267322 m[IU]/mL — ABNORMAL HIGH (ref <5)

## 2021-06-15 LAB — POCT PREGNANCY, URINE: Preg Test, Ur: POSITIVE — AB

## 2021-06-15 MED ORDER — LACTATED RINGERS IV SOLN: Freq: Once | INTRAVENOUS | Status: AC

## 2021-06-15 MED ORDER — PROMETHAZINE HCL 25 MG PO TABS: 25.0000 mg | ORAL_TABLET | Freq: Four times a day (QID) | ORAL | 2 refills | Status: DC | PRN

## 2021-06-15 MED ORDER — SODIUM CHLORIDE 0.9 % IV SOLN
12.5000 mg | Freq: Once | INTRAVENOUS | Status: AC
  Administered 2021-06-15: 12.5 mg via INTRAVENOUS
  Filled 2021-06-15: qty 0.5

## 2021-06-15 MED ORDER — HYOSCYAMINE SULFATE 0.125 MG SL SUBL
0.2500 mg | SUBLINGUAL_TABLET | Freq: Once | SUBLINGUAL | Status: AC
  Administered 2021-06-15: 0.25 mg via SUBLINGUAL
  Filled 2021-06-15: qty 2

## 2021-06-15 NOTE — MAU Provider Note (Signed)
Chief Complaint: severe abdominal pain  Event Date/Time   First Provider Initiated Contact with Patient 06/15/21 0548        SUBJECTIVE HPI: Beth Bray is a 25 y.o. G3P2002 at [redacted]w[redacted]d by LMP who presents to maternity admissions reporting abdominal pain (generalized), nausea, vomiting and diarrhea since 3am.  No bleeding.  No suspect food intake.  No ill contacts. . She denies vaginal bleeding, vaginal itching/burning, urinary symptoms, h/a, dizziness, n/v, or fever/chills.    Emesis  This is a new problem. The current episode started today. There has been no fever. Associated symptoms include abdominal pain, chills and diarrhea. Pertinent negatives include no headaches. She has tried nothing for the symptoms.  Diarrhea  This is a new problem. The current episode started today. Associated symptoms include abdominal pain, chills and vomiting. Pertinent negatives include no headaches. There are no known risk factors. She has tried nothing for the symptoms.  Abdominal Pain This is a new problem. The current episode started today. The onset quality is sudden. The problem occurs constantly. The problem has been unchanged. The pain is located in the generalized abdominal region. The quality of the pain is colicky and cramping. The abdominal pain does not radiate. Associated symptoms include diarrhea, nausea and vomiting. Pertinent negatives include no constipation or headaches. The pain is aggravated by palpation. The pain is relieved by Nothing. She has tried nothing for the symptoms.   RN Note: PT SAYS ABD PAIN STARTED ALL ABD AT 0300 HPT POSITIVE LAST WEEK VOMITED STARTED AT 0300  Past Medical History:  Diagnosis Date   Anxiety    Asthma    Blood transfusion without reported diagnosis 08/12/2018   2u PP   Depression    History of postpartum hemorrhage, currently pregnant    Past Surgical History:  Procedure Laterality Date   CESAREAN SECTION N/A 08/12/2018   Procedure: CESAREAN SECTION;   Surgeon: Grandview Bing, MD;  Location: [facility] BIRTHING SUITES;  Service: Obstetrics;  Laterality: N/A;   CESAREAN SECTION N/A 10/29/2020   Procedure: CESAREAN SECTION;  Surgeon: Osborn Coho, MD;  Location: [facility] LD ORS;  Service: Obstetrics;  Laterality: N/A;   NO PAST SURGERIES     WISDOM TOOTH EXTRACTION     Social History   Socioeconomic History   Marital status: Single    Spouse name: Not on file   Number of children: Not on file   Years of education: Not on file   Highest education level: Not on file  Occupational History   Not on file  Tobacco Use   Smoking status: Passive Smoke Exposure - Never Smoker   Smokeless tobacco: Never  Vaping Use   Vaping Use: Never used  Substance and Sexual Activity   Alcohol use: No   Drug use: No   Sexual activity: Not Currently    Birth control/protection: None  Other Topics Concern   Not on file  Social History Narrative   Not on file   Social Determinants of Health   Financial Resource Strain: Not on file  Food Insecurity: Not on file  Transportation Needs: Not on file  Physical Activity: Not on file  Stress: Not on file  Social Connections: Not on file  Intimate Partner Violence: Not on file   No current facility-administered medications on file prior to encounter.   Current Outpatient Medications on File Prior to Encounter  Medication Sig Dispense Refill   acetaminophen (TYLENOL) 325 MG tablet Take 2 tablets (650 mg total) by mouth every 6 (six)  hours.     albuterol (PROVENTIL HFA;VENTOLIN HFA) 108 (90 BASE) MCG/ACT inhaler Inhale 3 puffs into the lungs every 4 (four) hours as needed for wheezing. (Patient taking differently: Inhale 2 puffs into the lungs every 4 (four) hours as needed for wheezing.) 1 Inhaler 0   Prenatal Vit-Fe Fumarate-FA (MULTIVITAMIN-PRENATAL) 27-0.8 MG TABS tablet Take 1 tablet by mouth daily.     ferrous sulfate 325 (65 FE) MG tablet Take 1 tablet (325 mg total) by mouth 2 (two) times daily with a meal.  60 tablet 2   ibuprofen (ADVIL) 600 MG tablet Take 1 tablet (600 mg total) by mouth every 6 (six) hours. 30 tablet 0   sertraline (ZOLOFT) 50 MG tablet Take 1 tablet (50 mg total) by mouth daily. 30 tablet 2   Allergies  Allergen Reactions   Kiwi Extract Swelling   Pineapple Other (See Comments)    Bumps and sores in mouth    I have reviewed patient's Past Medical Hx, Surgical Hx, Family Hx, Social Hx, medications and allergies.   ROS:  Review of Systems  Constitutional:  Positive for chills.  Gastrointestinal:  Positive for abdominal pain, diarrhea, nausea and vomiting. Negative for constipation.  Neurological:  Negative for headaches.  Review of Systems  Other systems negative   Physical Exam  Physical Exam Patient Vitals for the past 24 hrs:  BP Temp Temp src Pulse Resp Height Weight  06/15/21 0537 (!) 102/59 97.9 F (36.6 C) Oral 72 20 5\' 3"  (1.6 m) 52.3 kg   Constitutional: Well-developed, female in no acute distress, but  lying in fetal position clutching abdomen and shivering.  .  Cardiovascular: normal rate Respiratory: normal effort GI: Abd soft, non-tender. Pain does not localize.  No rebound, mild guarding MS: Extremities nontender, no edema, normal ROM Neurologic: Alert and oriented x 4.  GU: Neg CVAT.  PELVIC EXAM: deferred due to nonlocalized pain and patient intolerance to being examined.   LAB RESULTS Results for orders placed or performed during the hospital encounter of 06/15/21 (from the past 24 hour(s))  Urinalysis, Routine w reflex microscopic Urine, Clean Catch     Status: Abnormal   Collection Time: 06/15/21  5:25 AM  Result Value Ref Range   Color, Urine AMBER (A) YELLOW   APPearance CLOUDY (A) CLEAR   Specific Gravity, Urine 1.016 1.005 - 1.030   pH 7.0 5.0 - 8.0   Glucose, UA NEGATIVE NEGATIVE mg/dL   Hgb urine dipstick NEGATIVE NEGATIVE   Bilirubin Urine NEGATIVE NEGATIVE   Ketones, ur 5 (A) NEGATIVE mg/dL   Protein, ur NEGATIVE NEGATIVE  mg/dL   Nitrite NEGATIVE NEGATIVE   Leukocytes,Ua SMALL (A) NEGATIVE   RBC / HPF 0-5 0 - 5 RBC/hpf   WBC, UA 6-10 0 - 5 WBC/hpf   Bacteria, UA FEW (A) NONE SEEN   Squamous Epithelial / LPF 21-50 0 - 5   Mucus PRESENT    Amorphous Crystal PRESENT   Pregnancy, urine POC     Status: Abnormal   Collection Time: 06/15/21  5:26 AM  Result Value Ref Range   Preg Test, Ur POSITIVE (A) NEGATIVE  CBC     Status: Abnormal   Collection Time: 06/15/21  5:46 AM  Result Value Ref Range   WBC 9.9 4.0 - 10.5 K/uL   RBC 4.66 3.87 - 5.11 MIL/uL   Hemoglobin 12.4 12.0 - 15.0 g/dL   HCT 08/15/21 17.0 - 01.7 %   MCV 79.4 (L) 80.0 - 100.0 fL  MCH 26.6 26.0 - 34.0 pg   MCHC 33.5 30.0 - 36.0 g/dL   RDW 62.1 30.8 - 65.7 %   Platelets 268 150 - 400 K/uL   nRBC 0.0 0.0 - 0.2 %  Comprehensive metabolic panel     Status: Abnormal   Collection Time: 06/15/21  5:46 AM  Result Value Ref Range   Sodium 134 (L) 135 - 145 mmol/L   Potassium 3.6 3.5 - 5.1 mmol/L   Chloride 108 98 - 111 mmol/L   CO2 18 (L) 22 - 32 mmol/L   Glucose, Bld 105 (H) 70 - 99 mg/dL   BUN 7 6 - 20 mg/dL   Creatinine, Ser 8.46 0.44 - 1.00 mg/dL   Calcium 9.3 8.9 - 96.2 mg/dL   Total Protein 6.9 6.5 - 8.1 g/dL   Albumin 4.0 3.5 - 5.0 g/dL   AST 19 15 - 41 U/L   ALT 17 0 - 44 U/L   Alkaline Phosphatase 45 38 - 126 U/L   Total Bilirubin 0.4 0.3 - 1.2 mg/dL   GFR, Estimated >95 >28 mL/min   Anion gap 8 5 - 15  hCG, quantitative, pregnancy     Status: Abnormal   Collection Time: 06/15/21  5:46 AM  Result Value Ref Range   hCG, Beta Chain, Quant, S 413,244 (H) <5 mIU/mL     --/--/O POS (02/17 1143)  IMAGING US OB Comp Less 14 Wks  Result Date: 06/15/2021 CLINICAL DATA:  25 year old pregnant female with history of sharp abdominal pain. EXAM: OBSTETRIC <14 WK ULTRASOUND TECHNIQUE: Transabdominal ultrasound was performed for evaluation of the gestation as well as the maternal uterus and adnexal regions. COMPARISON:  OB ultrasound  10/26/2020. FINDINGS: Intrauterine gestational sac: Present Yolk sac:  Present Embryo:  Present Cardiac Activity: Present Heart Rate: 162 bpm CRL:   25.8 mm   9 w 2 d                  [place] EDC: 01/16/2022 Subchorionic hemorrhage:  None visualized. Maternal uterus/adnexae: Bilateral ovaries are normal in appearance. No significant free fluid in the cul-de-sac. IMPRESSION: 1. Single viable IUP with estimated gestational age of [redacted] weeks and 2 days and normal fetal heart rate of 162 beats per minute. No acute findings. Electronically Signed   By: Trudie Reed M.D.   On: 06/15/2021 06:58     MAU Management/MDM: Ordered usual first trimester r/o ectopic labs.   Will check baseline Ultrasound to rule out ectopic.  This bleeding/pain can represent a normal pregnancy with bleeding, spontaneous abortion or even an ectopic which can be life-threatening.  The process as listed above helps to determine which of these is present.  Treatments in MAU included IV fluids, Phenergan, Levsin.  HCG and other labs normal.  No leukocytosis.  No hypokalemia. Patient's pain quickly abated.  Now states only has intermittent mild colicky pain. Tolerating PO intake. .   ASSESSMENT SIngle IUP at [redacted]w[redacted]d by LMP, [redacted]w[redacted]d by [place] Probable gastroenteritis Nausea and vomiting Diarrhea  PLAN Discharge home Rx Phenergan for nausea prn Followup in office  Pt stable at time of discharge. Encouraged to return here if she develops worsening of symptoms, increase in pain, fever, or other concerning symptoms.    Wynelle Bourgeois CNM, MSN Certified Nurse-Midwife 06/15/2021  5:48 AM

## 2021-06-15 NOTE — MAU Note (Signed)
PT SAYS ABD PAIN STARTED ALL ABD AT 0300 HPT POSITIVE LAST WEEK VOMITED STARTED AT 0300

## 2021-07-08 IMAGING — US US MFM OB LIMITED
1 series · 15 of 28 positions shown · non-contrast
Comparison: none

[Series 1: us mfm ob limited · 28 acquisitions, 15 frames shown]
[im 1/28]
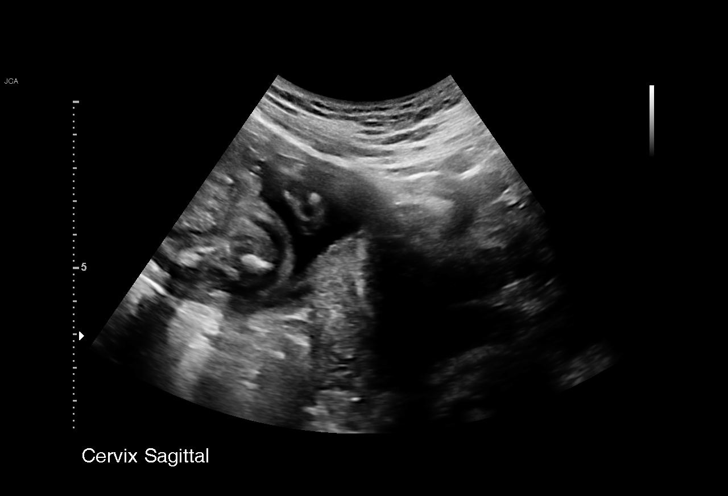
[im 3/28]
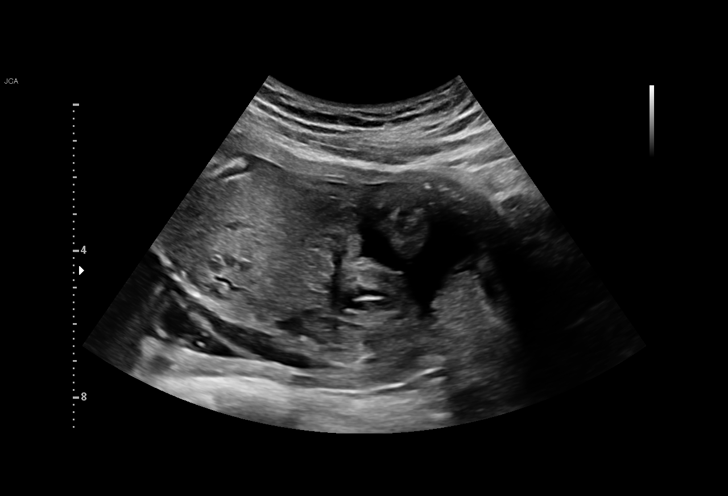
[im 5/28]
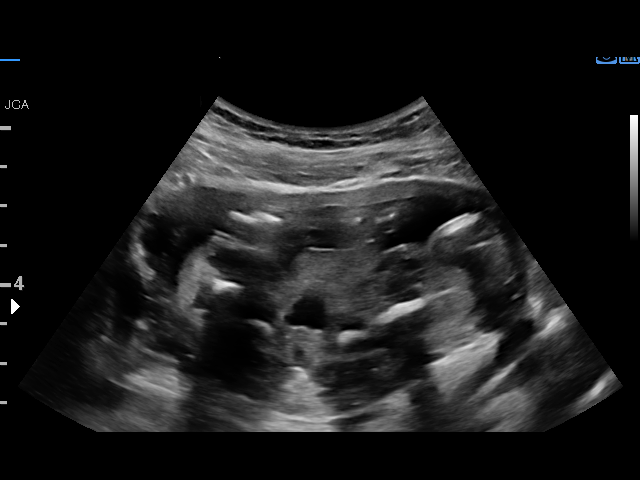
[im 7/28]
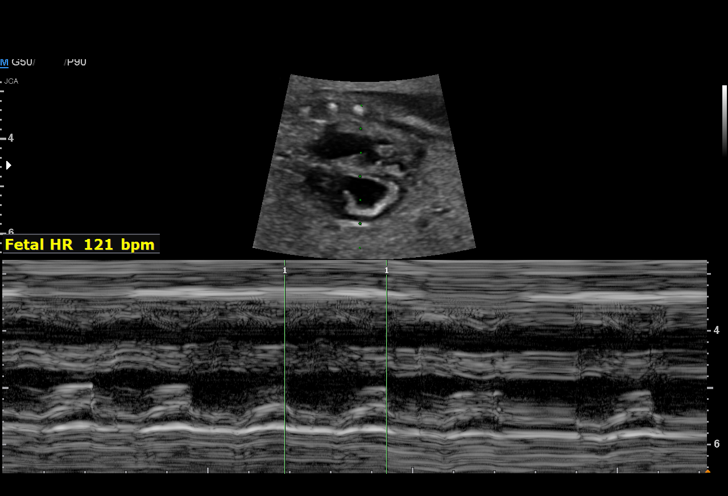
[im 9/28]
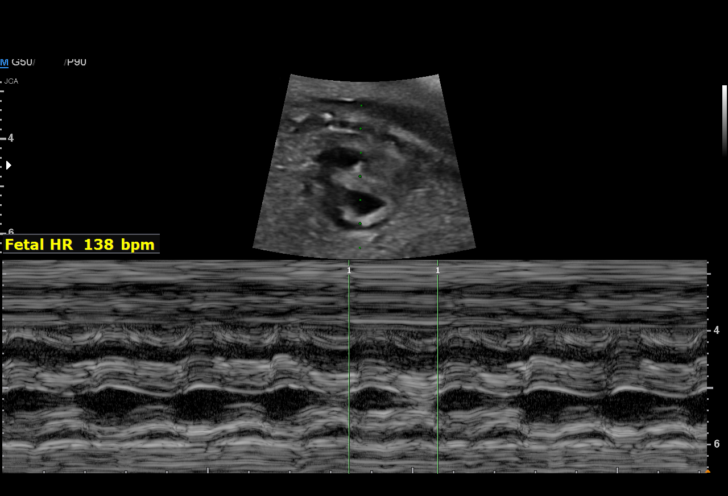
[im 11/28]
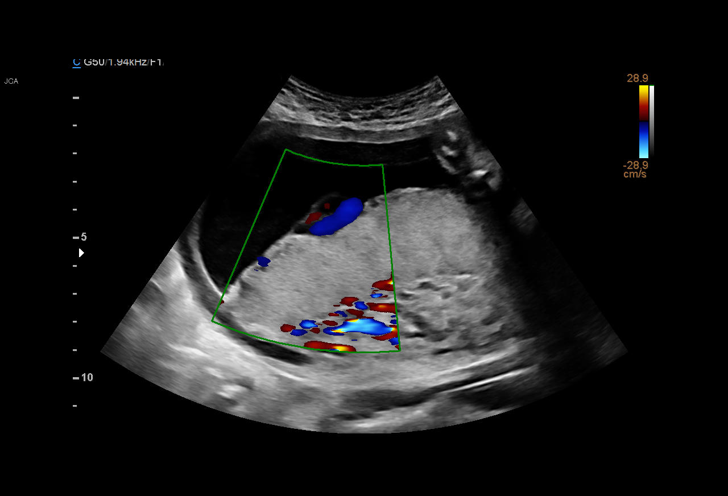
[im 13/28]
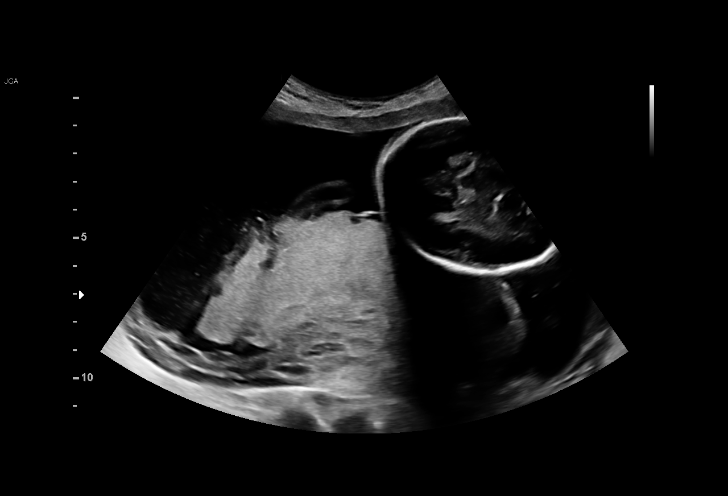
[im 15/28]
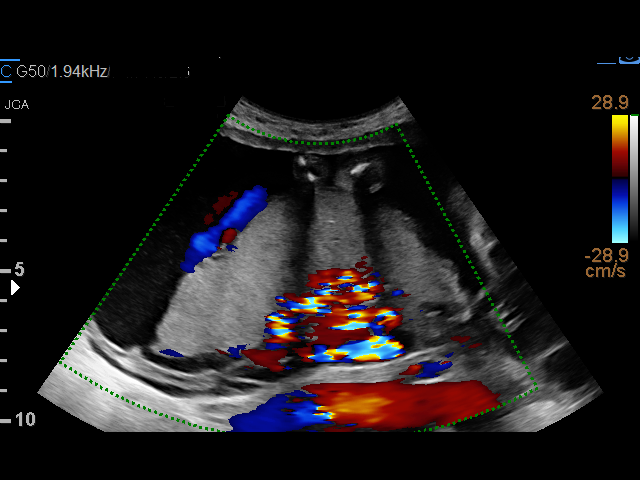
[im 16/28]
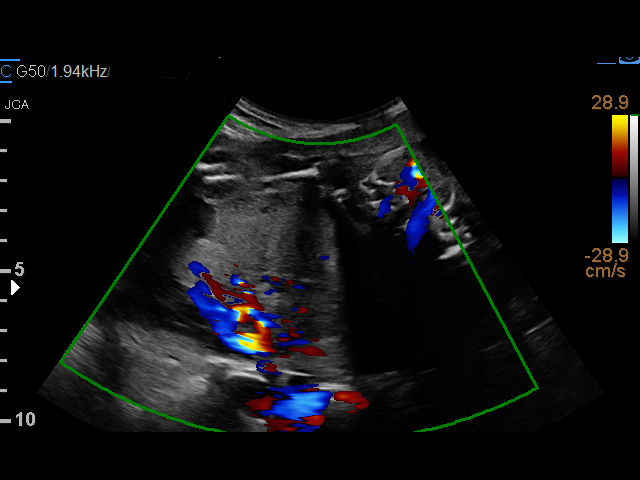
[im 18/28]
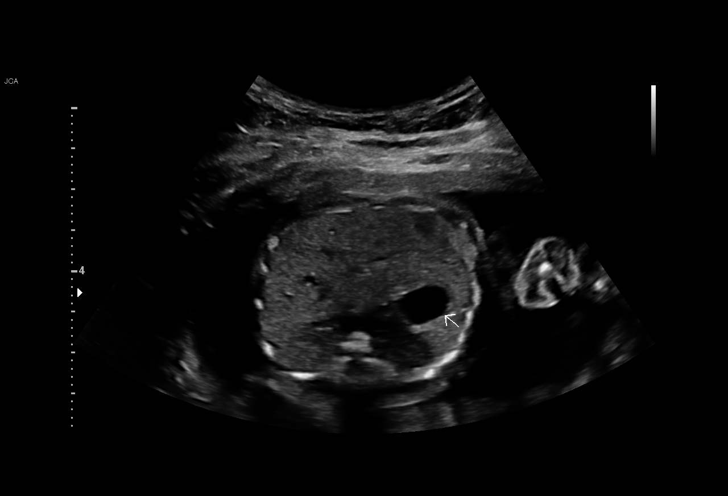
[im 20/28]
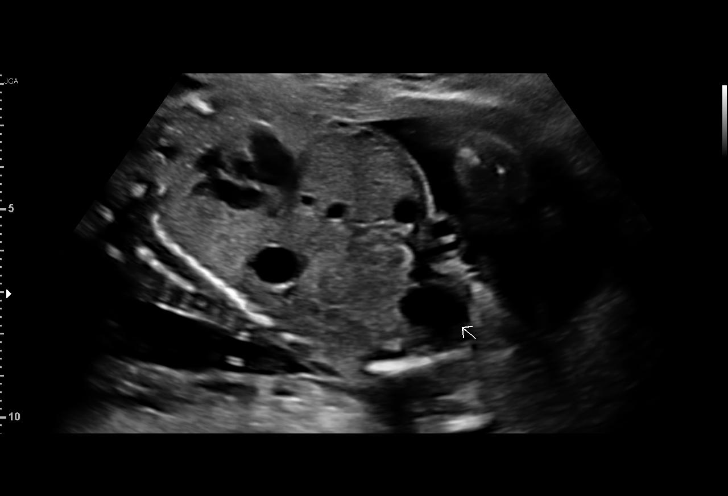
[im 22/28]
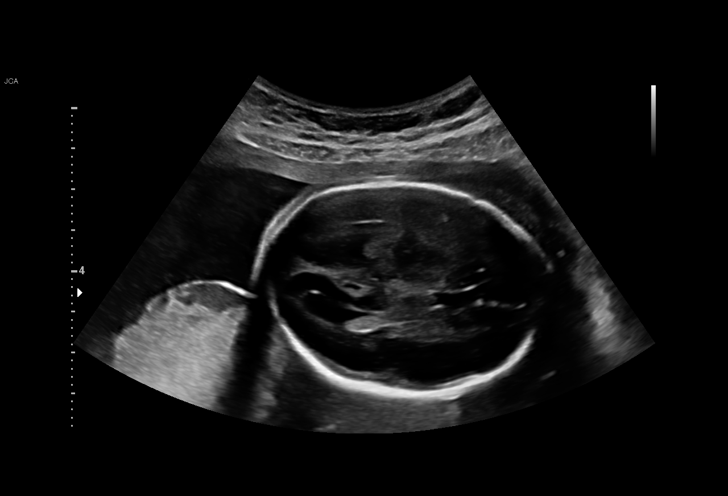
[im 24/28]
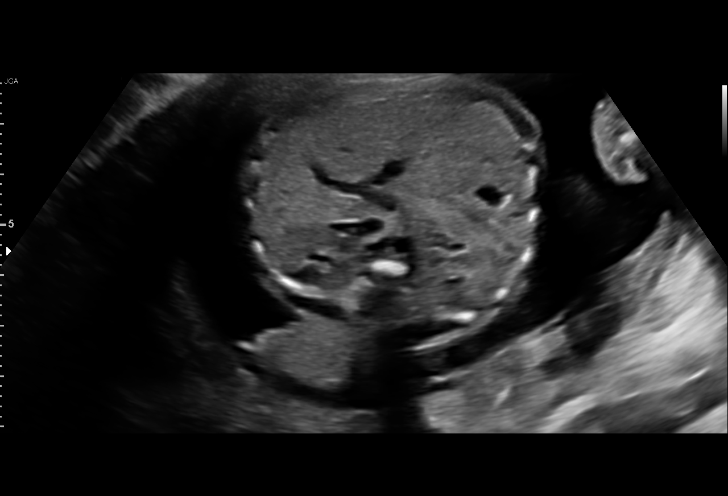
[im 26/28]
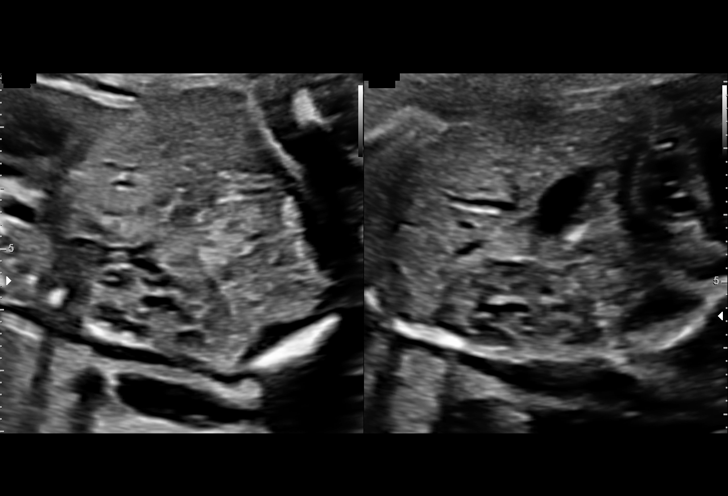
[im 28/28]
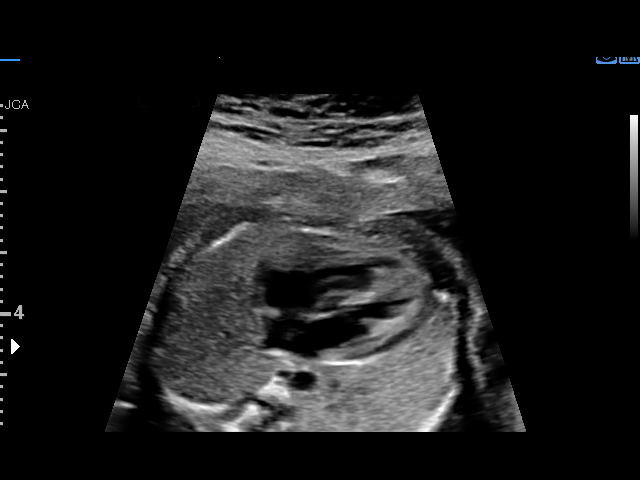

[15 of 28 positions shown; findings below may reference images not displayed]

1  US MFM OB LIMITED                     76815.01    EMMOTT
                                                      PETERLY ALIX

Indications

 22 weeks gestation of pregnancy
 Abdominal pain in pregnancy
Fetal Evaluation

 Num Of Fetuses:         1
 Fetal Heart Rate(bpm):  130
 Cardiac Activity:       Observed
 Presentation:           Breech
 Placenta:               Posterior
 P. Cord Insertion:      Visualized

 Amniotic Fluid
 AFI FV:      Within normal limits

                             Largest Pocket(cm)

Biometry

 LV:        4.3  mm
OB History

 Gravidity:    2         Term:   1
 Living:       1
Gestational Age

 Best:          22w 5d     Det. By:  Early Ultrasound         EDD:   11/04/20
                                     (03/12/20)
Anatomy
 Thoracic:              Appears normal         Kidneys:                Appear normal
 Diaphragm:             Appears normal         Bladder:                Appears normal
 Stomach:               Appears normal, left
                        sided
Cervix Uterus Adnexa

 Cervix
 Length:            3.7  cm.
 Normal appearance by transabdominal scan. Closed
Impression

 Patient was evaluated for abdominal pain .
 A limited ultrasound study was performed .Amniotic fluid is
 normal and good fetal activity is seen .Placenta is posterior
 and there is no evidence of previa or accreta (previous
 cesarean delivery). No evidence of retroplacental
 hemorrhage.
                 Gor, Jarred

## 2021-08-15 IMAGING — DX DG CHEST 1V PORT
1 series · 1 of 1 positions shown · non-contrast
Comparison: May 06, 2015

CLINICAL DATA: Chest pain, shortness of breath, and upper
respiratory infection. Patient is pregnant.

EXAM:
PORTABLE CHEST 1 VIEW

[chest ap]
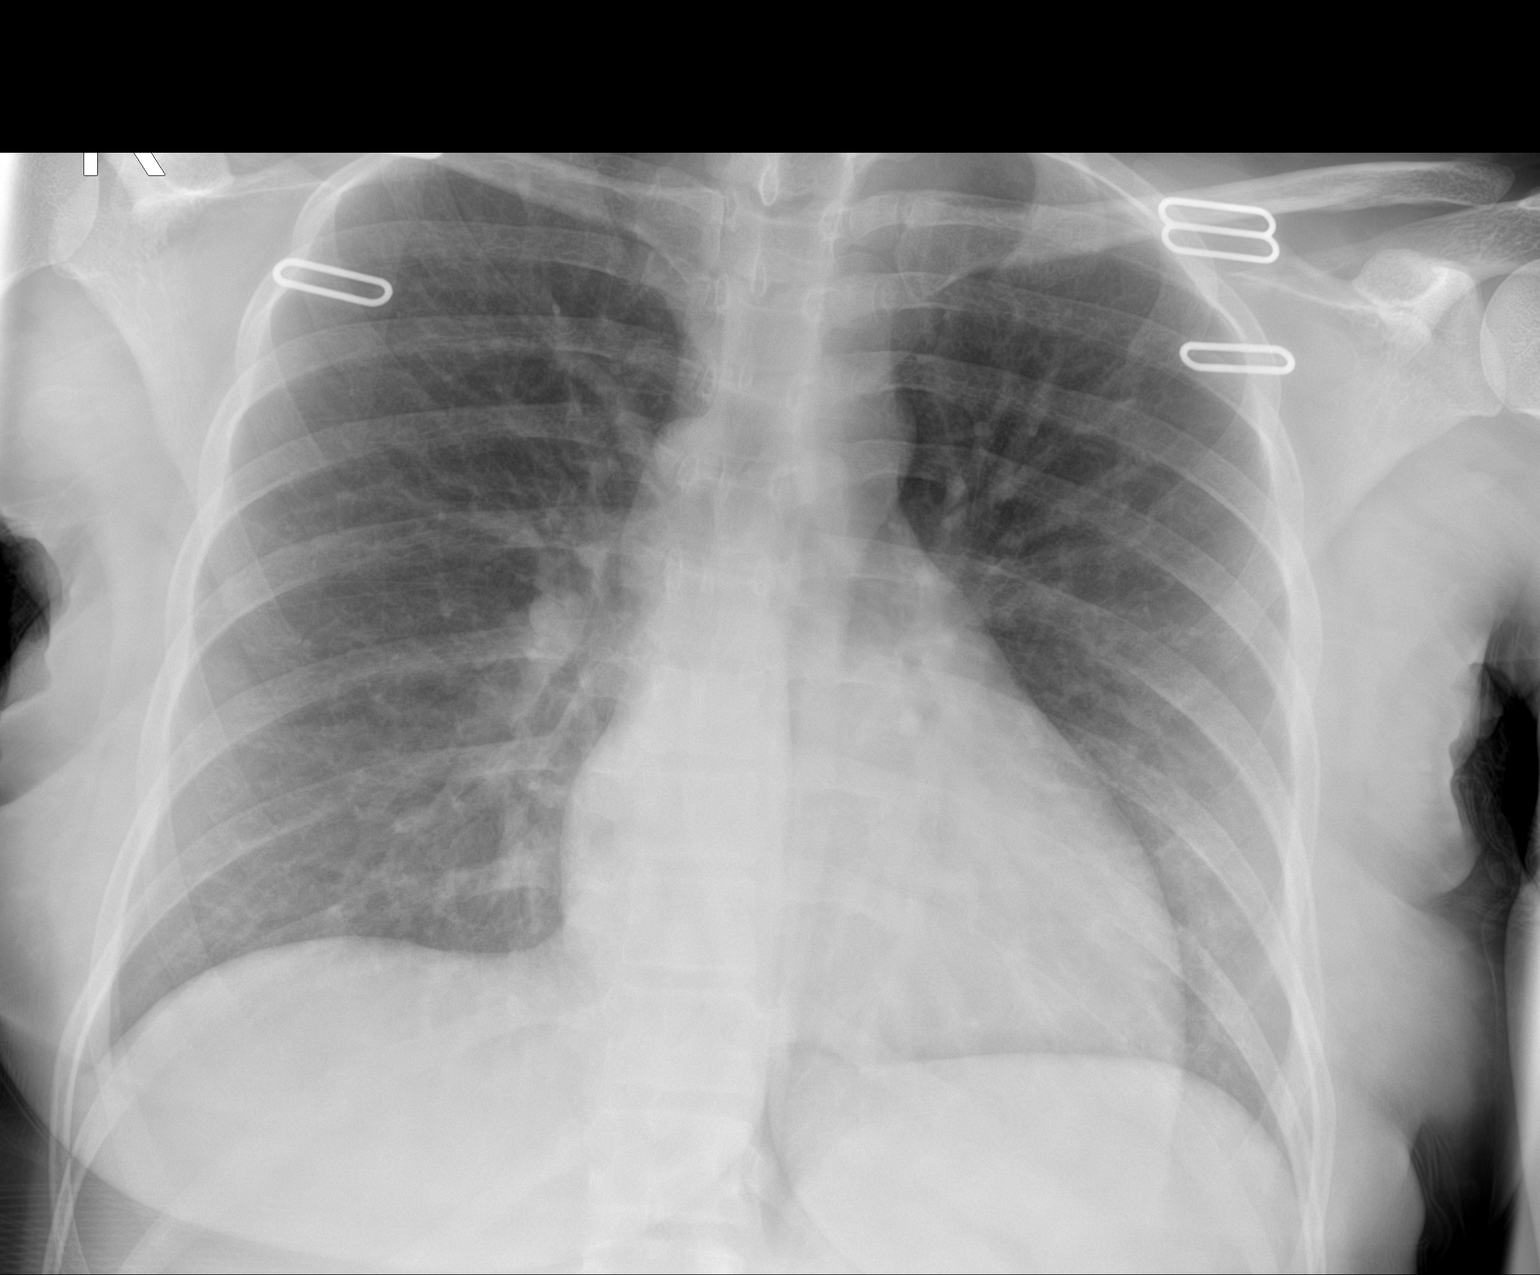

[1 of 1 positions shown; findings below may reference images not displayed]

FINDINGS: No pneumothorax. The heart, hila, and mediastinum are normal. No
pulmonary nodules or masses. No over pulmonary edema. No focal
infiltrates.
IMPRESSION: No active disease.

## 2021-09-21 LAB — HEPATITIS C ANTIBODY: HCV Ab: NEGATIVE

## 2021-09-21 LAB — OB RESULTS CONSOLE RPR: RPR: NONREACTIVE

## 2021-09-21 LAB — OB RESULTS CONSOLE RUBELLA ANTIBODY, IGM: Rubella: IMMUNE

## 2021-09-21 LAB — OB RESULTS CONSOLE GC/CHLAMYDIA
Chlamydia: NEGATIVE
Gonorrhea: NEGATIVE

## 2021-09-21 LAB — OB RESULTS CONSOLE HEPATITIS B SURFACE ANTIGEN: Hepatitis B Surface Ag: NEGATIVE

## 2021-09-21 LAB — OB RESULTS CONSOLE ABO/RH: RH Type: POSITIVE

## 2021-09-21 LAB — OB RESULTS CONSOLE HIV ANTIBODY (ROUTINE TESTING): HIV: NONREACTIVE

## 2021-09-21 LAB — OB RESULTS CONSOLE ANTIBODY SCREEN: Antibody Screen: NEGATIVE

## 2021-11-28 ENCOUNTER — Other Ambulatory Visit: Payer: Self-pay | Admitting: Obstetrics and Gynecology

## 2021-12-27 ENCOUNTER — Telehealth (HOSPITAL_COMMUNITY): Payer: Self-pay | Admitting: *Deleted

## 2021-12-27 NOTE — Patient Instructions (Signed)
Lexine Baton ? 12/27/2021 ? ? Your procedure is scheduled on:  01/07/2022 ? Arrive at [employer] at [facility] on [address] at [facility]  and [address]. You are invited to use the FREE valet parking or use the Visitor's parking deck. ? Pick up the phone at the desk and dial [phone]. ? Call this number if you have problems the morning of surgery: [phone] ? Remember: ? ? Do not eat food:(After Midnight) Desp?s de medianoche. ? Do not drink clear liquids: (After Midnight) Desp?s de medianoche. ? Take these medicines the morning of surgery with A SIP OF WATER:  Take zoloft as prescribed ? ? Do not wear jewelry, make-up or nail polish. ? Do not wear lotions, powders, or perfumes. Do not wear deodorant. ? Do not shave 48 hours prior to surgery. ? Do not bring valuables to the hospital.  [facility] is not  ? responsible for any belongings or valuables brought to the hospital. ? Contacts, dentures or bridgework may not be worn into surgery. ? Leave suitcase in the car. After surgery it may be brought to your room. ? For patients admitted to the hospital, checkout time is 11:00 AM the day of  ?            discharge. ? ?   ? Please read over the following fact sheets that you were given:  ?   Preparing for Surgery ? ? ?

## 2021-12-27 NOTE — Telephone Encounter (Signed)
Preadmission screen  

## 2021-12-29 ENCOUNTER — Telehealth (HOSPITAL_COMMUNITY): Payer: Self-pay | Admitting: *Deleted

## 2021-12-29 NOTE — Telephone Encounter (Signed)
Preadmission screen  

## 2021-12-30 ENCOUNTER — Telehealth (HOSPITAL_COMMUNITY): Payer: Self-pay | Admitting: *Deleted

## 2021-12-30 ENCOUNTER — Encounter (HOSPITAL_COMMUNITY): Payer: Self-pay

## 2021-12-30 NOTE — Telephone Encounter (Signed)
Preadmission screen  

## 2021-12-30 NOTE — Pre-Procedure Instructions (Signed)
She is feeling more depressed as delivery date gets closer.  Discussed developing a plan such as going to stay at her moms after delivery for a while.  Suggested having her mom over to help pack items for her and her children while they are staying with her.  Suggested planning ahead may alleviate some anxiety.   ? ?Denies feelings of self harm. ? ?Asked pt to contact MD office to possibly increase zoloft, develop a plan if the PPD returns with this delivery and set up a counselor for preventive care. ? ?Dr Mora Appl notifed and requested a note to be sent to her in Epic.  Message send with this above note attached. ?

## 2022-01-04 ENCOUNTER — Encounter (HOSPITAL_COMMUNITY): Payer: Self-pay

## 2022-01-04 NOTE — Patient Instructions (Signed)
Lexine Baton ? 01/04/2022 ? ? Your procedure is scheduled on:  4/30/20263 ? Arrive at [employer] at [employer] on [address] at [facility]  and [address]. You are invited to use the FREE valet parking or use the Visitor's parking deck. ? Pick up the phone at the desk and dial [phone]. ? Call this number if you have problems the morning of surgery: [phone] ? Remember: ? ? Do not eat food:(After Midnight) Desp?s de medianoche. ? Do not drink clear liquids: (After Midnight) Desp?s de medianoche. ? Take these medicines the morning of surgery with A SIP OF WATER:  Take zoloft as prescribed ? ? Do not wear jewelry, make-up or nail polish. ? Do not wear lotions, powders, or perfumes. Do not wear deodorant. ? Do not shave 48 hours prior to surgery. ? Do not bring valuables to the hospital.  [facility] is not  ? responsible for any belongings or valuables brought to the hospital. ? Contacts, dentures or bridgework may not be worn into surgery. ? Leave suitcase in the car. After surgery it may be brought to your room. ? For patients admitted to the hospital, checkout time is 11:00 AM the day of  ?            discharge. ? ?   ? Please read over the following fact sheets that you were given:  ?   Preparing for Surgery ? ? ?

## 2022-01-05 ENCOUNTER — Other Ambulatory Visit (HOSPITAL_COMMUNITY)
Admission: RE | Admit: 2022-01-05 | Discharge: 2022-01-05 | Disposition: A | Discharge status: Home / Self Care | Payer: Medicaid Other | Source: Ambulatory Visit | Attending: Obstetrics and Gynecology | Admitting: Obstetrics and Gynecology

## 2022-01-06 ENCOUNTER — Ambulatory Visit (HOSPITAL_COMMUNITY)
Admission: RE | Admit: 2022-01-06 | Discharge: 2022-01-06 | Disposition: A | Discharge status: Home / Self Care | Payer: Medicaid Other | Source: Ambulatory Visit | Attending: Obstetrics and Gynecology | Admitting: Obstetrics and Gynecology

## 2022-01-06 DIAGNOSIS — Z3A00 Weeks of gestation of pregnancy not specified: Secondary | ICD-10-CM | POA: Diagnosis not present

## 2022-01-06 DIAGNOSIS — Z3490 Encounter for supervision of normal pregnancy, unspecified, unspecified trimester: Secondary | ICD-10-CM | POA: Diagnosis present

## 2022-01-06 LAB — CBC
HCT: 28.5 % — ABNORMAL LOW (ref 36.0–46.0)
Hemoglobin: 8.9 g/dL — ABNORMAL LOW (ref 12.0–15.0)
MCH: 24.8 pg — ABNORMAL LOW (ref 26.0–34.0)
MCHC: 31.2 g/dL (ref 30.0–36.0)
MCV: 79.4 fL — ABNORMAL LOW (ref 80.0–100.0)
Platelets: 204 10*3/uL (ref 150–400)
RBC: 3.59 MIL/uL — ABNORMAL LOW (ref 3.87–5.11)
RDW: 15.8 % — ABNORMAL HIGH (ref 11.5–15.5)
WBC: 7.3 10*3/uL (ref 4.0–10.5)
nRBC: 0 % (ref 0.0–0.2)

## 2022-01-06 LAB — BASIC METABOLIC PANEL
Anion gap: 7 (ref 5–15)
BUN: <5 mg/dL — ABNORMAL LOW (ref 6–20)
CO2: 21 mmol/L — ABNORMAL LOW (ref 22–32)
Calcium: 8.7 mg/dL — ABNORMAL LOW (ref 8.9–10.3)
Chloride: 109 mmol/L (ref 98–111)
Creatinine, Ser: 0.65 mg/dL (ref 0.44–1.00)
GFR, Estimated: >60 mL/min (ref >60)
Glucose, Bld: 113 mg/dL — ABNORMAL HIGH (ref 70–99)
Potassium: 4.1 mmol/L (ref 3.5–5.1)
Sodium: 137 mmol/L (ref 135–145)

## 2022-01-08 ENCOUNTER — Inpatient Hospital Stay (HOSPITAL_COMMUNITY)
Admission: RE | Admit: 2022-01-08 | Discharge: 2022-01-11 | DRG: 787 | Disposition: A | Discharge status: Home / Self Care | Payer: Medicaid Other | Attending: Obstetrics & Gynecology | Admitting: Obstetrics & Gynecology

## 2022-01-08 ENCOUNTER — Inpatient Hospital Stay (HOSPITAL_COMMUNITY): Payer: Medicaid Other | Admitting: Anesthesiology

## 2022-01-08 ENCOUNTER — Other Ambulatory Visit: Payer: Self-pay

## 2022-01-08 ENCOUNTER — Encounter (HOSPITAL_COMMUNITY): Payer: Self-pay | Admitting: Obstetrics and Gynecology

## 2022-01-08 ENCOUNTER — Encounter (HOSPITAL_COMMUNITY)
Admission: RE | Disposition: A | Discharge status: Home / Self Care | Payer: Self-pay | Source: Home / Self Care | Attending: Obstetrics and Gynecology

## 2022-01-08 DIAGNOSIS — O9081 Anemia of the puerperium: Secondary | ICD-10-CM | POA: Diagnosis not present

## 2022-01-08 DIAGNOSIS — D649 Anemia, unspecified: Secondary | ICD-10-CM

## 2022-01-08 DIAGNOSIS — O34211 Maternal care for low transverse scar from previous cesarean delivery: Secondary | ICD-10-CM | POA: Diagnosis present

## 2022-01-08 DIAGNOSIS — Z3A39 39 weeks gestation of pregnancy: Secondary | ICD-10-CM

## 2022-01-08 DIAGNOSIS — O9902 Anemia complicating childbirth: Secondary | ICD-10-CM

## 2022-01-08 DIAGNOSIS — O34219 Maternal care for unspecified type scar from previous cesarean delivery: Secondary | ICD-10-CM

## 2022-01-08 DIAGNOSIS — D62 Acute posthemorrhagic anemia: Secondary | ICD-10-CM | POA: Diagnosis not present

## 2022-01-08 DIAGNOSIS — Z98891 History of uterine scar from previous surgery: Principal | ICD-10-CM

## 2022-01-08 DIAGNOSIS — O9952 Diseases of the respiratory system complicating childbirth: Secondary | ICD-10-CM | POA: Diagnosis present

## 2022-01-08 DIAGNOSIS — J45909 Unspecified asthma, uncomplicated: Secondary | ICD-10-CM | POA: Diagnosis present

## 2022-01-08 LAB — PREPARE RBC (CROSSMATCH)

## 2022-01-08 LAB — RPR: RPR Ser Ql: NONREACTIVE

## 2022-01-08 SURGERY — Surgical Case
Anesthesia: Spinal

## 2022-01-08 MED ORDER — MORPHINE SULFATE (PF) 0.5 MG/ML IJ SOLN
INTRAMUSCULAR | Status: DC | PRN
  Administered 2022-01-08: 150 ug via INTRATHECAL

## 2022-01-08 MED ORDER — PROMETHAZINE HCL 25 MG/ML IJ SOLN: 6.2500 mg | INTRAMUSCULAR | Status: DC | PRN

## 2022-01-08 MED ORDER — OXYTOCIN-SODIUM CHLORIDE 30-0.9 UT/500ML-% IV SOLN
INTRAVENOUS | Status: AC
  Filled 2022-01-08: qty 500

## 2022-01-08 MED ORDER — SIMETHICONE 80 MG PO CHEW
80.0000 mg | CHEWABLE_TABLET | Freq: Three times a day (TID) | ORAL | Status: DC
  Administered 2022-01-08 – 2022-01-11 (×7): 80 mg via ORAL
  Filled 2022-01-08 (×9): qty 1

## 2022-01-08 MED ORDER — KETOROLAC TROMETHAMINE 30 MG/ML IJ SOLN
INTRAMUSCULAR | Status: AC
  Filled 2022-01-08: qty 1

## 2022-01-08 MED ORDER — ONDANSETRON HCL 4 MG/2ML IJ SOLN
INTRAMUSCULAR | Status: DC | PRN
  Administered 2022-01-08: 4 mg via INTRAVENOUS

## 2022-01-08 MED ORDER — SCOPOLAMINE 1 MG/3DAYS TD PT72
1.0000 | MEDICATED_PATCH | Freq: Once | TRANSDERMAL | Status: AC
  Administered 2022-01-08: 1.5 mg via TRANSDERMAL
  Filled 2022-01-08: qty 1

## 2022-01-08 MED ORDER — SOD CITRATE-CITRIC ACID 500-334 MG/5ML PO SOLN
ORAL | Status: AC
  Filled 2022-01-08: qty 30

## 2022-01-08 MED ORDER — SODIUM CHLORIDE 0.9 % IR SOLN
Status: DC | PRN
  Administered 2022-01-08: 1

## 2022-01-08 MED ORDER — ALBUMIN HUMAN 5 % IV SOLN
INTRAVENOUS | Status: AC
  Filled 2022-01-08: qty 250

## 2022-01-08 MED ORDER — DEXAMETHASONE SODIUM PHOSPHATE 10 MG/ML IJ SOLN
INTRAMUSCULAR | Status: AC
  Filled 2022-01-08: qty 1

## 2022-01-08 MED ORDER — MEPERIDINE HCL 25 MG/ML IJ SOLN: 6.2500 mg | INTRAMUSCULAR | Status: DC | PRN

## 2022-01-08 MED ORDER — SENNOSIDES-DOCUSATE SODIUM 8.6-50 MG PO TABS
2.0000 | ORAL_TABLET | Freq: Every day | ORAL | Status: DC
  Administered 2022-01-09 – 2022-01-11 (×3): 2 via ORAL
  Filled 2022-01-08 (×4): qty 2

## 2022-01-08 MED ORDER — OXYCODONE HCL 5 MG/5ML PO SOLN: 5.0000 mg | Freq: Once | ORAL | Status: DC | PRN

## 2022-01-08 MED ORDER — WITCH HAZEL-GLYCERIN EX PADS: 1.0000 "application " | MEDICATED_PAD | CUTANEOUS | Status: DC | PRN

## 2022-01-08 MED ORDER — KETOROLAC TROMETHAMINE 30 MG/ML IJ SOLN: 30.0000 mg | Freq: Four times a day (QID) | INTRAMUSCULAR | Status: DC | PRN

## 2022-01-08 MED ORDER — PHENYLEPHRINE HCL-NACL 20-0.9 MG/250ML-% IV SOLN
INTRAVENOUS | Status: DC | PRN
  Administered 2022-01-08: 60 ug/min via INTRAVENOUS

## 2022-01-08 MED ORDER — PRENATAL MULTIVITAMIN CH
1.0000 | ORAL_TABLET | Freq: Every day | ORAL | Status: DC
  Administered 2022-01-09 – 2022-01-11 (×3): 1 via ORAL
  Filled 2022-01-08 (×4): qty 1

## 2022-01-08 MED ORDER — CEFAZOLIN SODIUM-DEXTROSE 2-4 GM/100ML-% IV SOLN
2.0000 g | INTRAVENOUS | Status: AC
  Administered 2022-01-08: 2 g via INTRAVENOUS

## 2022-01-08 MED ORDER — KETOROLAC TROMETHAMINE 30 MG/ML IJ SOLN
30.0000 mg | Freq: Four times a day (QID) | INTRAMUSCULAR | Status: AC
  Administered 2022-01-08 – 2022-01-09 (×3): 30 mg via INTRAVENOUS
  Filled 2022-01-08 (×3): qty 1

## 2022-01-08 MED ORDER — ACETAMINOPHEN 500 MG PO TABS
1000.0000 mg | ORAL_TABLET | Freq: Four times a day (QID) | ORAL | Status: DC
  Administered 2022-01-08 – 2022-01-09 (×6): 1000 mg via ORAL
  Filled 2022-01-08 (×7): qty 2

## 2022-01-08 MED ORDER — FENTANYL CITRATE (PF) 100 MCG/2ML IJ SOLN
INTRAMUSCULAR | Status: AC
  Filled 2022-01-08: qty 2

## 2022-01-08 MED ORDER — ONDANSETRON HCL 4 MG/2ML IJ SOLN
4.0000 mg | Freq: Three times a day (TID) | INTRAMUSCULAR | Status: DC | PRN
  Administered 2022-01-08 – 2022-01-09 (×2): 4 mg via INTRAVENOUS
  Filled 2022-01-08 (×2): qty 2

## 2022-01-08 MED ORDER — COCONUT OIL OIL
1.0000 "application " | TOPICAL_OIL | Status: DC | PRN
  Administered 2022-01-09: 1 via TOPICAL
  Filled 2022-01-08: qty 120

## 2022-01-08 MED ORDER — SOD CITRATE-CITRIC ACID 500-334 MG/5ML PO SOLN
30.0000 mL | Freq: Once | ORAL | Status: AC
  Administered 2022-01-08: 30 mL via ORAL

## 2022-01-08 MED ORDER — LACTATED RINGERS IV SOLN: INTRAVENOUS | Status: DC

## 2022-01-08 MED ORDER — MORPHINE SULFATE (PF) 0.5 MG/ML IJ SOLN
INTRAMUSCULAR | Status: AC
  Filled 2022-01-08: qty 10

## 2022-01-08 MED ORDER — KETOROLAC TROMETHAMINE 30 MG/ML IJ SOLN: 30.0000 mg | Freq: Once | INTRAMUSCULAR | Status: DC

## 2022-01-08 MED ORDER — MENTHOL 3 MG MT LOZG
1.0000 | LOZENGE | OROMUCOSAL | Status: DC | PRN
  Filled 2022-01-08: qty 9

## 2022-01-08 MED ORDER — OXYTOCIN-SODIUM CHLORIDE 30-0.9 UT/500ML-% IV SOLN
2.5000 [IU]/h | INTRAVENOUS | Status: AC
  Administered 2022-01-08: 2.5 [IU]/h via INTRAVENOUS

## 2022-01-08 MED ORDER — POVIDONE-IODINE 10 % EX SWAB
2.0000 "application " | Freq: Once | CUTANEOUS | Status: AC
  Administered 2022-01-08: 2 via TOPICAL

## 2022-01-08 MED ORDER — CEFAZOLIN SODIUM-DEXTROSE 2-4 GM/100ML-% IV SOLN
INTRAVENOUS | Status: AC
  Filled 2022-01-08: qty 100

## 2022-01-08 MED ORDER — IBUPROFEN 600 MG PO TABS
600.0000 mg | ORAL_TABLET | Freq: Four times a day (QID) | ORAL | Status: DC
  Administered 2022-01-09 – 2022-01-10 (×4): 600 mg via ORAL
  Filled 2022-01-08 (×5): qty 1

## 2022-01-08 MED ORDER — DIPHENHYDRAMINE HCL 25 MG PO CAPS
25.0000 mg | ORAL_CAPSULE | ORAL | Status: DC | PRN
  Filled 2022-01-08: qty 1

## 2022-01-08 MED ORDER — BUPIVACAINE IN DEXTROSE 0.75-8.25 % IT SOLN
INTRATHECAL | Status: DC | PRN
  Administered 2022-01-08: 1.8 mL via INTRATHECAL

## 2022-01-08 MED ORDER — DIPHENHYDRAMINE HCL 25 MG PO CAPS
25.0000 mg | ORAL_CAPSULE | Freq: Four times a day (QID) | ORAL | Status: DC | PRN
  Administered 2022-01-08: 25 mg via ORAL

## 2022-01-08 MED ORDER — PHENYLEPHRINE 80 MCG/ML (10ML) SYRINGE FOR IV PUSH (FOR BLOOD PRESSURE SUPPORT)
PREFILLED_SYRINGE | INTRAVENOUS | Status: DC | PRN
Start: 2022-01-08 — End: 2022-01-08
  Administered 2022-01-08 (×2): 80 ug via INTRAVENOUS

## 2022-01-08 MED ORDER — OXYCODONE HCL 5 MG PO TABS: 5.0000 mg | ORAL_TABLET | Freq: Once | ORAL | Status: DC | PRN

## 2022-01-08 MED ORDER — TETANUS-DIPHTH-ACELL PERTUSSIS 5-2.5-18.5 LF-MCG/0.5 IM SUSY
0.5000 mL | PREFILLED_SYRINGE | Freq: Once | INTRAMUSCULAR | Status: DC
  Filled 2022-01-08: qty 0.5

## 2022-01-08 MED ORDER — ALBUMIN HUMAN 5 % IV SOLN: INTRAVENOUS | Status: DC | PRN

## 2022-01-08 MED ORDER — ACETAMINOPHEN 10 MG/ML IV SOLN: 1000.0000 mg | Freq: Once | INTRAVENOUS | Status: DC | PRN

## 2022-01-08 MED ORDER — NALOXONE HCL 4 MG/10ML IJ SOLN
1.0000 ug/kg/h | INTRAVENOUS | Status: DC | PRN
  Filled 2022-01-08: qty 5

## 2022-01-08 MED ORDER — DEXAMETHASONE SODIUM PHOSPHATE 10 MG/ML IJ SOLN
INTRAMUSCULAR | Status: DC | PRN
  Administered 2022-01-08: 10 mg via INTRAVENOUS

## 2022-01-08 MED ORDER — OXYCODONE HCL 5 MG PO TABS
5.0000 mg | ORAL_TABLET | ORAL | Status: DC | PRN
  Administered 2022-01-09: 10 mg via ORAL
  Administered 2022-01-09: 5 mg via ORAL
  Administered 2022-01-09 – 2022-01-10 (×3): 10 mg via ORAL
  Filled 2022-01-08 (×3): qty 2
  Filled 2022-01-08: qty 1
  Filled 2022-01-08: qty 2

## 2022-01-08 MED ORDER — ZOLPIDEM TARTRATE 5 MG PO TABS: 5.0000 mg | ORAL_TABLET | Freq: Every evening | ORAL | Status: DC | PRN

## 2022-01-08 MED ORDER — NALOXONE HCL 0.4 MG/ML IJ SOLN: 0.4000 mg | INTRAMUSCULAR | Status: DC | PRN

## 2022-01-08 MED ORDER — OXYTOCIN-SODIUM CHLORIDE 30-0.9 UT/500ML-% IV SOLN
INTRAVENOUS | Status: DC | PRN
Start: 2022-01-08 — End: 2022-01-08
  Administered 2022-01-08: 30 [IU] via INTRAVENOUS

## 2022-01-08 MED ORDER — PHENYLEPHRINE HCL-NACL 20-0.9 MG/250ML-% IV SOLN
INTRAVENOUS | Status: AC
  Filled 2022-01-08: qty 250

## 2022-01-08 MED ORDER — HYDROMORPHONE HCL 1 MG/ML IJ SOLN: 0.2500 mg | INTRAMUSCULAR | Status: DC | PRN

## 2022-01-08 MED ORDER — ONDANSETRON HCL 4 MG/2ML IJ SOLN
INTRAMUSCULAR | Status: AC
  Filled 2022-01-08: qty 2

## 2022-01-08 MED ORDER — SODIUM CHLORIDE 0.9% FLUSH: 3.0000 mL | INTRAVENOUS | Status: DC | PRN

## 2022-01-08 MED ORDER — DIPHENHYDRAMINE HCL 50 MG/ML IJ SOLN
12.5000 mg | INTRAMUSCULAR | Status: DC | PRN
  Administered 2022-01-08: 12.5 mg via INTRAVENOUS
  Filled 2022-01-08: qty 1

## 2022-01-08 MED ORDER — SIMETHICONE 80 MG PO CHEW
80.0000 mg | CHEWABLE_TABLET | ORAL | Status: DC | PRN
  Filled 2022-01-08: qty 1

## 2022-01-08 MED ORDER — FENTANYL CITRATE (PF) 100 MCG/2ML IJ SOLN
INTRAMUSCULAR | Status: DC | PRN
  Administered 2022-01-08: 15 ug via INTRATHECAL

## 2022-01-08 MED ORDER — DIBUCAINE (PERIANAL) 1 % EX OINT
1.0000 "application " | TOPICAL_OINTMENT | CUTANEOUS | Status: DC | PRN
  Filled 2022-01-08: qty 28

## 2022-01-08 MED ORDER — STERILE WATER FOR IRRIGATION IR SOLN
Status: DC | PRN
  Administered 2022-01-08: 1

## 2022-01-08 MED ORDER — SERTRALINE HCL 25 MG PO TABS
25.0000 mg | ORAL_TABLET | Freq: Every day | ORAL | Status: DC
  Administered 2022-01-09 – 2022-01-11 (×2): 25 mg via ORAL
  Filled 2022-01-08 (×5): qty 1

## 2022-01-08 MED ORDER — SODIUM CHLORIDE 0.9% IV SOLUTION: Freq: Once | INTRAVENOUS | Status: DC

## 2022-01-08 MED ORDER — KETOROLAC TROMETHAMINE 30 MG/ML IJ SOLN
30.0000 mg | Freq: Four times a day (QID) | INTRAMUSCULAR | Status: DC | PRN
  Administered 2022-01-08: 30 mg via INTRAMUSCULAR

## 2022-01-08 SURGICAL SUPPLY — 38 items
APL SKNCLS STERI-STRIP NONHPOA (GAUZE/BANDAGES/DRESSINGS) ×1
BENZOIN TINCTURE PRP APPL 2/3 (GAUZE/BANDAGES/DRESSINGS) ×2 IMPLANT
CHLORAPREP W/TINT 26ML (MISCELLANEOUS) ×4 IMPLANT
CLAMP CORD UMBIL (MISCELLANEOUS) ×2 IMPLANT
CLOSURE STERI STRIP 1/2 X4 (GAUZE/BANDAGES/DRESSINGS) ×1 IMPLANT
CLOTH BEACON ORANGE TIMEOUT ST (SAFETY) ×2 IMPLANT
DRSG OPSITE POSTOP 4X10 (GAUZE/BANDAGES/DRESSINGS) ×2 IMPLANT
ELECT REM PT RETURN 9FT ADLT (ELECTROSURGICAL) ×2
ELECTRODE REM PT RTRN 9FT ADLT (ELECTROSURGICAL) ×1 IMPLANT
EXTRACTOR VACUUM M CUP 4 TUBE (SUCTIONS) IMPLANT
GAUZE SPONGE 4X4 12PLY STRL LF (GAUZE/BANDAGES/DRESSINGS) ×2 IMPLANT
GLOVE BIO SURGEON STRL SZ7.5 (GLOVE) ×2 IMPLANT
GLOVE BIOGEL PI IND STRL 7.0 (GLOVE) ×1 IMPLANT
GLOVE BIOGEL PI IND STRL 7.5 (GLOVE) ×1 IMPLANT
GLOVE BIOGEL PI INDICATOR 7.0 (GLOVE) ×1
GLOVE BIOGEL PI INDICATOR 7.5 (GLOVE) ×1
GOWN STRL REUS W/TWL LRG LVL3 (GOWN DISPOSABLE) ×4 IMPLANT
KIT ABG SYR 3ML LUER SLIP (SYRINGE) IMPLANT
NDL HYPO 25X5/8 SAFETYGLIDE (NEEDLE) IMPLANT
NEEDLE HYPO 25X5/8 SAFETYGLIDE (NEEDLE) IMPLANT
NS IRRIG 1000ML POUR BTL (IV SOLUTION) ×2 IMPLANT
PACK C SECTION WH (CUSTOM PROCEDURE TRAY) ×2 IMPLANT
PAD ABD 7.5X8 STRL (GAUZE/BANDAGES/DRESSINGS) ×1 IMPLANT
PAD OB MATERNITY 4.3X12.25 (PERSONAL CARE ITEMS) ×2 IMPLANT
RTRCTR C-SECT PINK 25CM LRG (MISCELLANEOUS) ×2 IMPLANT
STRIP CLOSURE SKIN 1/2X4 (GAUZE/BANDAGES/DRESSINGS) ×2 IMPLANT
SUT CHROMIC 2 0 CT 1 (SUTURE) ×2 IMPLANT
SUT MNCRL AB 3-0 PS2 27 (SUTURE) ×2 IMPLANT
SUT PLAIN 2 0 XLH (SUTURE) ×2 IMPLANT
SUT VIC AB 0 CT1 36 (SUTURE) ×3 IMPLANT
SUT VIC AB 0 CTX 36 (SUTURE) ×6
SUT VIC AB 0 CTX36XBRD ANBCTRL (SUTURE) ×3 IMPLANT
SUT VIC AB 2-0 SH 27 (SUTURE)
SUT VIC AB 2-0 SH 27XBRD (SUTURE) IMPLANT
TAPE CLOTH SURG 4X10 WHT LF (GAUZE/BANDAGES/DRESSINGS) ×1 IMPLANT
TOWEL OR 17X24 6PK STRL BLUE (TOWEL DISPOSABLE) ×2 IMPLANT
TRAY FOLEY W/BAG SLVR 14FR LF (SET/KITS/TRAYS/PACK) ×2 IMPLANT
WATER STERILE IRR 1000ML POUR (IV SOLUTION) ×2 IMPLANT

## 2022-01-08 NOTE — Anesthesia Procedure Notes (Signed)
Spinal ° °Patient location during procedure: OR °Reason for block: surgical anesthesia °Staffing °Performed: anesthesiologist  °Anesthesiologist: Serra Younan E, MD °Preanesthetic Checklist °Completed: patient identified, IV checked, risks and benefits discussed, surgical consent, monitors and equipment checked, pre-op evaluation and timeout performed °Spinal Block °Patient position: sitting °Prep: DuraPrep and site prepped and draped °Patient monitoring: continuous pulse ox, blood pressure and heart rate °Approach: midline °Location: L3-4 °Injection technique: single-shot °Needle °Needle type: Pencan  °Needle gauge: 24 G °Needle length: 10 cm °Assessment °Events: CSF return °Additional Notes °Functioning IV was confirmed and monitors were applied. Sterile prep and drape, including hand hygiene and sterile gloves were used. The patient was positioned and the spine was prepped. The skin was anesthetized with lidocaine.  Free flow of clear CSF was obtained prior to injecting local anesthetic into the CSF. The needle was carefully withdrawn. The patient tolerated the procedure well.  ° ° ° °

## 2022-01-08 NOTE — Anesthesia Preprocedure Evaluation (Addendum)
Anesthesia Evaluation  ?Patient identified by MRN, date of birth, ID band ?Patient awake ? ? ? ?Reviewed: ?Allergy & Precautions, NPO status , Patient's Chart, lab work & pertinent test results ? ?History of Anesthesia Complications ?Negative for: history of anesthetic complications ? ?Airway ?Mallampati: II ? ?TM Distance: >3 FB ?Neck ROM: Full ? ? ? Dental ?  ?Pulmonary ?asthma ,  ?  ?Pulmonary exam normal ? ? ? ? ? ? ? Cardiovascular ?negative cardio ROS ?Normal cardiovascular exam ? ? ?  ?Neuro/Psych ?negative neurological ROS ?   ? GI/Hepatic ?negative GI ROS, Neg liver ROS,   ?Endo/Other  ?negative endocrine ROS ? Renal/GU ?negative Renal ROS  ? ?  ?Musculoskeletal ?negative musculoskeletal ROS ?(+)  ? Abdominal ?  ?Peds ? Hematology ? ?(+) Blood dyscrasia, anemia ,   ?Anesthesia Other Findings ? ? Reproductive/Obstetrics ?(+) Pregnancy ?[id number] at [redacted]w[redacted]d ?Prior C/S x2 ? ?  ? ? ? ? ? ? ? ? ? ? ? ? ? ?  ?  ? ? ? ? ? ? ? ?Anesthesia Physical ?Anesthesia Plan ? ?ASA: 2 ? ?Anesthesia Plan: Spinal  ? ?Post-op Pain Management:   ? ?Induction:  ? ?PONV Risk Score and Plan: Ondansetron and Treatment may vary due to age or medical condition ? ?Airway Management Planned: Natural Airway ? ?Additional Equipment: None ? ?Intra-op Plan:  ? ?Post-operative Plan:  ? ?Informed Consent: I have reviewed the patients History and Physical, chart, labs and discussed the procedure including the risks, benefits and alternatives for the proposed anesthesia with the patient or authorized representative who has indicated his/her understanding and acceptance.  ? ? ? ? ? ?Plan Discussed with:  ? ?Anesthesia Plan Comments:   ? ? ? ? ? ?Anesthesia Quick Evaluation ? ?

## 2022-01-08 NOTE — Anesthesia Postprocedure Evaluation (Signed)
Anesthesia Post Note ? ?Patient: Beth Bray ? ?Procedure(s) Performed: CESAREAN SECTION ? ?  ? ?Patient location during evaluation: PACU ?Anesthesia Type: Spinal ?Level of consciousness: oriented and awake and alert ?Pain management: pain level controlled ?Vital Signs Assessment: post-procedure vital signs reviewed and stable ?Respiratory status: spontaneous breathing, respiratory function stable and nonlabored ventilation ?Cardiovascular status: blood pressure returned to baseline and stable ?Postop Assessment: no headache, no backache, no apparent nausea or vomiting and spinal receding ?Anesthetic complications: no ? ? ?No notable events documented. ? ?Last Vitals:  ?Vitals:  ? 01/08/22 1200 01/08/22 1216  ?BP: 96/66 102/73  ?Pulse: 81 74  ?Resp: (!) 23 16  ?Temp:    ?SpO2: 96% 96%  ?  ?Last Pain:  ?Vitals:  ? 01/08/22 1216  ?TempSrc:   ?PainSc: 4   ? ?Pain Goal:   ? ?LLE Motor Response: No movement due to regional block (01/08/22 1216) ?LLE Sensation: No sensation (absent) (01/08/22 1216) ?RLE Motor Response: No movement due to regional block (01/08/22 1216) ?RLE Sensation: No sensation (absent) (01/08/22 1216) ?  ?  ?  ? ?Lucretia Kern ? ? ? ? ?

## 2022-01-08 NOTE — Lactation Note (Signed)
This note was copied from a baby's chart. ?Lactation Consultation Note ? ?Patient Name: Beth Bray ?Today's Date: 01/08/2022 ?Reason for consult: Follow-up assessment ?Age:24 hours ? ?Maternal Data ?Has patient been taught Hand Expression?: Yes ?Does the patient have breastfeeding experience prior to this delivery?: Yes ?How long did the patient breastfeed?: Breastfed her 24 year old for a year- recently weaned infant ? ?Baby cueing when [facility] entered room.  LC assisted with pillows for mom to latch.  Baby latched easily with rhuthmic sucking and audible swallows.  Encouraged good head support for a deep latch. ? ?Mom encouraged to hand express before latching.  Praised mom for efforts.   ? ?Feeding ?Mother's Current Feeding Choice: Breast Milk ? ?LATCH Score ?Latch: Grasps breast easily, tongue down, lips flanged, rhythmical sucking. ? ?Audible Swallowing: A few with stimulation ? ?Type of Nipple: Everted at rest and after stimulation ? ?Comfort (Breast/Nipple): Soft / non-tender ? ?Hold (Positioning): Assistance needed to correctly position infant at breast and maintain latch. ? ?LATCH Score: 8 ? ? ?Lactation Tools Discussed/Used ?Tools: Pump ?Breast pump type: Manual ?Pump Education: Setup, frequency, and cleaning ?Reason for Pumping: pre pump as needed ?Pumping frequency: pre pump as needed ?Pumped volume: 0 mL ? ?Interventions ?Interventions: Breast feeding basics reviewed;Skin to skin;Assisted with latch ? ?Discharge ?Pump: Manual;Personal (WIC madela pump) ?WIC Program: Yes ? ?Consult Status ?Consult Status: Follow-up ?Date: 01/09/22 ?Follow-up type: In-patient ? ? ? ?[name] ?01/08/2022, 6:57 PM ? ? ? ?

## 2022-01-08 NOTE — Op Note (Signed)
Cesarean Section Procedure Note ? ?Indications: P2 at 55 1/7wks presenting for repeat c-section. ? ?Pre-operative Diagnosis: 1.39 1/7wks 2.h/o CESAREAN SECTION ?  ?Post-operative Diagnosis: 1.39 1/7wks 2.h/o CESAREAN SECTION ? ?Procedure: REPEAT CESAREAN SECTION ? ?Surgeon: Everett Graff, MD   ? ?Assistants: Dr. Tania Ade ? ?Anesthesia: Regional ? ?Anesthesiologist: Lidia Collum, MD  ? ?Procedure Details  ?The patient was taken to the operating room secondary to h/o c-section after the risks, benefits, complications, treatment options, and expected outcomes were discussed with the patient.  The patient concurred with the proposed plan, giving informed consent which was signed and witnessed. The patient was taken to Operating Room B, identified as Beth Bray and the procedure verified as C-Section Delivery. A Time Out was held and the above information confirmed. ? ?After induction of anesthesia by obtaining a spinal, the patient was prepped and draped in the usual sterile manner. A Pfannenstiel skin incision was made and carried down through the subcutaneous tissue to the underlying layer of fascia.  The fascia was incised bilaterally and extended transversely bilaterally with the Mayo scissors. Kocher clamps were placed on the inferior aspect of the fascial incision and the underlying rectus muscle was separated from the fascia. The same was done on the superior aspect of the fascial incision.  The peritoneum was identified, entered bluntly and extended manually.  An Alexis self-retaining retractor was placed.  The utero-vesical peritoneal reflection was incised transversely and the bladder flap was bluntly freed from the lower uterine segment. A low transverse uterine incision was made with the scalpel and extended bilaterally with the bandage scissors.  The infant was delivered in vertex position without difficulty.  After the umbilical cord was clamped and cut, the infant was handed to the awaiting  pediatricians.  Cord blood was obtained for evaluation.  The placenta was removed intact and appeared to be within normal limits. The uterus was cleared of all clots and debris. The uterine incision was closed with running interlocking sutures of 0 Vicryl and a second imbricating layer was performed as well.   Bilateral tubes and ovaries appeared to be within normal limits.  Good hemostasis was noted.  Copious irrigation was performed until clear.  The peritoneum was repaired with 2-0 chromic via a running suture.  The fascia was reapproximated with a running suture of 0 Vicryl. The subcutaneous tissue was reapproximated with 3 interrupted sutures of 2-0 plain.  The skin was reapproximated with a subcuticular suture of 3-0 monocryl.  Steristrips were applied. ? ?Instrument, sponge, and needle counts were correct prior to abdominal closure and at the conclusion of the case.  The patient was awaiting transfer to the recovery room in good condition. ? ?Findings: ?Live female infant with Apgars 7 at one minute and 9 at five minutes.  Normal appearing bilateral ovaries and fallopian tubes were noted. ? ?Estimated Blood Loss:  482 ml ?        ?Drains: foley to gravity 150 cc clear urine  ?        ?Total IV Fluids: 2500 ml ?        ?Specimens to Pathology: none ?        ?Complications:  None; patient tolerated the procedure well. ?        ?Disposition: PACU - hemodynamically stable. ?        ?Condition: stable ? ?Attending Attestation: I performed the procedure. ? ?I was present and scrubbed and the assistant was required due to complexity of anatomy. ?  ?

## 2022-01-08 NOTE — Transfer of Care (Signed)
Immediate Anesthesia Transfer of Care Note ? ?Patient: Beth Bray ? ?Procedure(s) Performed: CESAREAN SECTION ? ?Patient Location: PACU ? ?Anesthesia Type:Spinal ? ?Level of Consciousness: awake, alert  and oriented ? ?Airway & Oxygen Therapy: Patient Spontanous Breathing ? ?Post-op Assessment: Report given to RN and Post -op Vital signs reviewed and stable ? ?Post vital signs: Reviewed and stable ? ?Last Vitals:  ?Vitals Value Taken Time  ?BP 96/66 01/08/22 1201  ?Temp 36.1 ?C 01/08/22 1120  ?Pulse 87 01/08/22 1211  ?Resp 20 01/08/22 1211  ?SpO2 99 % 01/08/22 1211  ?Vitals shown include unvalidated device data. ? ?Last Pain:  ?Vitals:  ? 01/08/22 1200  ?TempSrc:   ?PainSc: 1   ?   ? ?  ? ?Complications: No notable events documented. ?

## 2022-01-08 NOTE — Social Work (Signed)
MOB was referred for history of depression and anxiety.  ? ?* Referral screened out by Clinical Social Worker because none of the following criteria appear to apply: ? ?~ History of anxiety/depression during this pregnancy, or of post-partum depression following prior delivery. ?~ Diagnosis of anxiety and/or depression within last 3 years. MOB previously reported being diagnosed in 2016. ?OR ?* MOB's symptoms currently being treated with medication and/or therapy. ? ?Please contact the Clinical Social Worker if needs arise, by MOB request, or if MOB scores greater than 9/yes to question 10 on Edinburgh Postpartum Depression Screen. ? ?Jadyn Brasher, LCSW ?Clinical Social Work ?[facility]  ?[phone]  ?

## 2022-01-08 NOTE — Lactation Note (Signed)
This note was copied from a baby's chart. ?Lactation Consultation Note ? ?Patient Name: Beth Bray ?Today's Date: 01/08/2022 ?Reason for consult: Initial assessment;Early term 37-38.6wks ?Age:24 hours ? ?P3 Mom who was just taking infant off her breast when [facility] student entered the room.  Per mom infant nursed for 20 minutes.  Congratulated mom and reviewed breastfeeding history.  Mom recently stopped breastfeeding her [age] year old.   ?Reviewed breast feeding basics, cluster feeding, and hand expression.  Mom also desired a hand pump and one was provided.   ? ?Plan: ? Breastfeed infant based on hunger cues 8+ times in 24 hours.   ?Pre-pump as needed   ?Continue skin to skin ? ?Outpatient recourses provided to mom.  Mom encouraged to call for assistance.  All questions and concerns addressed prior to [facility] student leaving the room.    ? ?Maternal Data ?Has patient been taught Hand Expression?: Yes ?Does the patient have breastfeeding experience prior to this delivery?: Yes ?How long did the patient breastfeed?: Breastfed her 24 year old for a year- recently weaned infant ? ?Feeding ?Mother's Current Feeding Choice: Breast Milk ? ?Lactation Tools Discussed/Used ?Tools: Pump ?Breast pump type: Manual ?Pump Education: Setup, frequency, and cleaning ?Reason for Pumping: pre pump as needed ?Pumping frequency: pre pump as needed ?Pumped volume: 0 mL ? ?Interventions ?Interventions: Breast feeding basics reviewed;Skin to skin;Education;Breast massage;Hand express;LC Services brochure;Pre-pump if needed;Hand pump ? ?Discharge ?Pump: Manual;Personal (WIC madela pump) ?[place] Program: Yes ? ?Consult Status ?Consult Status: Follow-up ?Date: 01/09/22 ? ? ? ?Forrestine Him ?01/08/2022, 3:55 PM ? ? ? ?

## 2022-01-08 NOTE — H&P (Signed)
Beth Bray is a 26 y.o. female presenting for repeat c-section. ? ?OB History   ? ? Gravida  ?3  ? Para  ?2  ? Term  ?2  ? Preterm  ?0  ? AB  ?0  ? Living  ?2  ?  ? ? SAB  ?0  ? IAB  ?0  ? Ectopic  ?0  ? Multiple  ?0  ? Live Births  ?2  ?   ?  ?  ? ?Past Medical History:  ?Diagnosis Date  ? Anxiety   ? Asthma   ? Blood transfusion without reported diagnosis 08/12/2018  ? 2u PP  ? Depression   ? History of postpartum hemorrhage, currently pregnant   ? ?Past Surgical History:  ?Procedure Laterality Date  ? CESAREAN SECTION N/A 08/12/2018  ? Procedure: CESAREAN SECTION;  Surgeon: Trumbull Bing, MD;  Location: [facility] BIRTHING SUITES;  Service: Obstetrics;  Laterality: N/A;  ? CESAREAN SECTION N/A 10/29/2020  ? Procedure: CESAREAN SECTION;  Surgeon: Osborn Coho, MD;  Location: [facility] LD ORS;  Service: Obstetrics;  Laterality: N/A;  ? NO PAST SURGERIES    ? WISDOM TOOTH EXTRACTION    ? ?Family History: family history includes Heart disease in her mother; Hypertension in her mother; Thyroid disease in her mother. ?Social History:  reports that she is a non-smoker but has been exposed to tobacco smoke. She has never used smokeless tobacco. She reports that she does not drink alcohol and does not use drugs. ? ? ?  ?Maternal Diabetes: No ?Genetic Screening: Normal ?Maternal Ultrasounds/Referrals: Normal ?Fetal Ultrasounds or other Referrals:  None ?Maternal Substance Abuse:  No ?Significant Maternal Medications:  Vistaril ?Significant Maternal Lab Results:  Group B Strep negative ?Other Comments:  None ? ?Review of Systems ?Denies f/c/n/v/d ? ?History ?  ?Blood pressure 104/70, pulse 80, temperature 99.3 ?F (37.4 ?C), temperature source Oral, resp. rate 18, height 5\' 3"  (1.6 m), weight 70.9 kg, last menstrual period 04/25/2021, SpO2 97 %, unknown if currently breastfeeding. ?Exam ?Physical Exam  ?Lungs CTA ?CV RRR ?Abdomen gravid, NT ?Ext no calf tenderness ? ?Prenatal labs: ?ABO, Rh: --/--/O POS (04/28 1036) ?Antibody: NEG  (04/28 1036) ?Rubella: Immune (01/11 0000) ?RPR: Nonreactive (01/11 0000)  ?HBsAg: Negative (01/11 0000)  ?HIV: Non-reactive (01/11 0000)  ?GBS:   negative ? ?Assessment/Plan: ?P2 at 86 1/7wks with h/o cesarean section presenting for repeat c-section.  She does not desire sterilization.  Risks benefits alternatives reviewed with the patient, consent signed and witnessed and pt had no questions.  ? ?02-26-2003 ?01/08/2022, 9:22 AM ? ? ? ? ?

## 2022-01-09 LAB — CBC
HCT: 24.5 % — ABNORMAL LOW (ref 36.0–46.0)
Hemoglobin: 7.9 g/dL — ABNORMAL LOW (ref 12.0–15.0)
MCH: 25.2 pg — ABNORMAL LOW (ref 26.0–34.0)
MCHC: 32.2 g/dL (ref 30.0–36.0)
MCV: 78.3 fL — ABNORMAL LOW (ref 80.0–100.0)
Platelets: 204 10*3/uL (ref 150–400)
RBC: 3.13 MIL/uL — ABNORMAL LOW (ref 3.87–5.11)
RDW: 15.9 % — ABNORMAL HIGH (ref 11.5–15.5)
WBC: 13.3 10*3/uL — ABNORMAL HIGH (ref 4.0–10.5)
nRBC: 0 % (ref 0.0–0.2)

## 2022-01-09 NOTE — Progress Notes (Signed)
Mom felt nauseous before start of shift but wasn't throwing up. RN able to do orthostatics on mom and she did fairly well with walking. Mom experiencing a lot of pain/discomfort. Mom without support person in room and hasn't been able to sleep since delivery. Baby feeding often and fussy when laid down. Mom feeling worse throughout night with more nausea and pain. RN continued to encourage rest but mom unable to. Hgb came back 7.9 this AM. Mom has been able to drink and eat fairly well and when RN wanted to get foley out, mom felt tired and wanted to try and rest. RN helped baby fall asleep and get settled in crib. Mom turned off lights and appeared to finally fall asleep as well.  ?

## 2022-01-09 NOTE — Progress Notes (Signed)
Subjective: ?Postpartum Day 1: Cesarean Delivery ?Patient reports tolerating PO and + flatus.   ? ?Objective: ?Vital signs in last 24 hours: ?Temp:  [97.4 ?F (36.3 ?C)-97.9 ?F (36.6 ?C)] 97.9 ?F (36.6 ?C) (05/01 1497) ?Pulse Rate:  [66-78] 72 (05/01 0514) ?Resp:  [16-18] 16 (05/01 0002) ?BP: (92-104)/(46-72) 99/51 (05/01 0514) ?SpO2:  [98 %-100 %] 100 % (05/01 0514) ? ?Physical Exam:  ?General: alert and cooperative ?Lochia: appropriate ?Uterine Fundus: firm ?Incision: healing well, no significant drainage, no dehiscence ?DVT Evaluation: No evidence of DVT seen on physical exam. ? ?Recent Labs  ?  01/09/22 ?0263  ?HGB 7.9*  ?HCT 24.5*  ? ? ?Assessment/Plan: ?Status post Cesarean section. Doing well postoperatively.  ?Continue current care. ?Pt BF and plans IUD.   ?Possible discharge tomorrow ? ?Mychael Smock A Toriana Sponsel ?01/09/2022, 1:16 PM ? ? ?

## 2022-01-10 LAB — TYPE AND SCREEN
ABO/RH(D): O POS
Antibody Screen: NEGATIVE
Unit division: 0
Unit division: 0

## 2022-01-10 LAB — BPAM RBC
Blood Product Expiration Date: 202305312359
Blood Product Expiration Date: 202305312359
Unit Type and Rh: 5100
Unit Type and Rh: 5100

## 2022-01-10 MED ORDER — SCOPOLAMINE 1 MG/3DAYS TD PT72
1.0000 | MEDICATED_PATCH | TRANSDERMAL | Status: DC
  Administered 2022-01-10: 1.5 mg via TRANSDERMAL
  Filled 2022-01-10: qty 1

## 2022-01-10 MED ORDER — IBUPROFEN 800 MG PO TABS
800.0000 mg | ORAL_TABLET | Freq: Three times a day (TID) | ORAL | Status: DC
  Administered 2022-01-10 – 2022-01-11 (×3): 800 mg via ORAL
  Filled 2022-01-10 (×3): qty 1

## 2022-01-10 MED ORDER — PROMETHAZINE HCL 25 MG PO TABS
25.0000 mg | ORAL_TABLET | Freq: Four times a day (QID) | ORAL | Status: DC | PRN
  Administered 2022-01-10 – 2022-01-11 (×3): 25 mg via ORAL
  Filled 2022-01-10 (×3): qty 1

## 2022-01-10 MED ORDER — OXYCODONE-ACETAMINOPHEN 5-325 MG PO TABS
2.0000 | ORAL_TABLET | ORAL | Status: DC
  Administered 2022-01-10 – 2022-01-11 (×7): 2 via ORAL
  Filled 2022-01-10 (×7): qty 2

## 2022-01-10 MED ORDER — HYDROMORPHONE HCL 2 MG PO TABS
2.0000 mg | ORAL_TABLET | Freq: Four times a day (QID) | ORAL | Status: DC | PRN
Start: 2022-01-10 — End: 2022-01-10
  Administered 2022-01-10: 2 mg via ORAL
  Filled 2022-01-10: qty 1

## 2022-01-10 MED ORDER — GABAPENTIN 100 MG PO CAPS
100.0000 mg | ORAL_CAPSULE | Freq: Three times a day (TID) | ORAL | Status: DC
Start: 2022-01-10 — End: 2022-01-11
  Administered 2022-01-10 – 2022-01-11 (×3): 100 mg via ORAL
  Filled 2022-01-10 (×3): qty 1

## 2022-01-10 MED ORDER — GABAPENTIN 100 MG PO CAPS
100.0000 mg | ORAL_CAPSULE | Freq: Three times a day (TID) | ORAL | Status: DC | PRN
  Administered 2022-01-10: 100 mg via ORAL
  Filled 2022-01-10: qty 1

## 2022-01-10 MED ORDER — PANTOPRAZOLE SODIUM 40 MG PO TBEC
40.0000 mg | DELAYED_RELEASE_TABLET | Freq: Every day | ORAL | Status: DC
  Administered 2022-01-10 – 2022-01-11 (×2): 40 mg via ORAL
  Filled 2022-01-10 (×2): qty 1

## 2022-01-10 NOTE — Progress Notes (Signed)
Patient states that she is having severe abdominal pain and that the pain medication is not working. She states "the pain seems to be getting stronger".    Patient rates her pain at a level 9  on a 0-10 pain scale. On-call MD notified and new orders given.  Will continue to follow. ? ? ?Darrick Grinder, RN.  ?

## 2022-01-10 NOTE — Progress Notes (Signed)
Subjective: ?Postpartum Day 2: Cesarean Delivery ?Patient reports that her pain is not well controlled.  ?She is also having nausea. She hasn't eaten today.  ?She denies vomiting.  ?She notes that it is hard to ambulate due to the pain.  ?She has passed flatus, denies BM, voiding w/o difficulty.  ?She is breast feeding.  ? ?Objective: ?Vital signs in last 24 hours: ?Temp:  [97.7 ?F (36.5 ?C)-98 ?F (36.7 ?C)] 97.7 ?F (36.5 ?C) (05/02 0450) ?Pulse Rate:  [71-90] 90 (05/02 0450) ?Resp:  [16-18] 16 (05/02 0450) ?BP: (96-108)/(52-65) 96/55 (05/02 0450) ?SpO2:  [99 %-100 %] 99 % (05/02 0450) ? ?Physical Exam:  ?General: alert, cooperative, fatigued, and mild distress ?Lochia: appropriate ?Abdomen: Diffusely tender, no rebound, +guarding ?Incision: healing well, no significant drainage, no dehiscence ?DVT Evaluation: No evidence of DVT seen on physical exam. ?Negative Homan's sign. ?No cords or calf tenderness. ? ?Recent Labs  ?  01/09/22 ?7510  ?HGB 7.9*  ?HCT 24.5*  ? ? ?Assessment/Plan: ?Status post Cesarean section with poor pain control.  ?Possible ileus forming, d/t inactivity.  ?Will change pain medication regimen.  ?Increase Motrin to 800 mg q 8, d/c separate acetaminophen and add ?Percocet 2 tabs scheduled q4 ?Make gabapentin scheduled ?Patient may have gas pain as well so will add protonix.  ?Heating pad to abdomen, encouraged ambulation and warm liquids to help GI motility.  ? ?Beth Bray ?01/10/2022, 10:41 AM ? ? ?

## 2022-01-10 NOTE — Progress Notes (Signed)
Pt states that pain is 10 on 0-10 pain scale. Pt states the pain is on her left abdominal side and radiates to her back and comes out of no where. Nurse administered motrin. Charge nurse made aware. MD aware.  ?

## 2022-01-10 NOTE — Lactation Note (Signed)
This note was copied from a baby's chart. ?Lactation Consultation Note ? ?Patient Name: Beth Bray ?Today's Date: 01/10/2022 ?Reason for consult: Follow-up assessment;Mother's request;Difficult latch;Early term 37-38.6wks;Nipple pain/trauma;Breastfeeding assistance;Infant weight loss ?Age:24 hours ? ?Infant adequate stool output with frequent feedings. Mom some areas of hardness from plugged ducts that resolved with breast massage and coconut oil in midst of latch, followed by use of manual pump.  ? ?Mom stated pain resolved with changes made in the latch. Breasts softened with latching and breast massage.  ? ?Plan 1. To feed based on cues 8-12x 24hr period. Mom to offer breasts and look for signs of milk transfer.  ?2. Mom to supplement with EBM via pace bottle feeding with slow flow nipple ( 18 ml or more) BF supplementation guide provided  ?3. Manual pump q 3hrs for 10 min each breast.  ? ?All questions answered at the end of the visit.  ? ?Maternal Data ?Has patient been taught Hand Expression?: Yes ?Does the patient have breastfeeding experience prior to this delivery?: Yes ?How long did the patient breastfeed?: 1 year ? ?Feeding ?Mother's Current Feeding Choice: Breast Milk ? ?LATCH Score ?Latch: Grasps breast easily, tongue down, lips flanged, rhythmical sucking. ? ?Audible Swallowing: Spontaneous and intermittent ? ?Type of Nipple: Everted at rest and after stimulation ? ?Comfort (Breast/Nipple): Filling, red/small blisters or bruises, mild/mod discomfort ? ?Hold (Positioning): Assistance needed to correctly position infant at breast and maintain latch. ? ?LATCH Score: 8 ? ? ?Lactation Tools Discussed/Used ?Tools: Pump;Flanges;Coconut oil ?Flange Size: 24 ?Breast pump type: Manual ?Pump Education: Setup, frequency, and cleaning;Milk Storage ?Reason for Pumping: aleviate discomfort ?Pumping frequency: every 3 hrs for 10 min each breast ? ?Interventions ?Interventions: Breast feeding basics  reviewed;Assisted with latch;Skin to skin;Breast massage;Hand express;Breast compression;Adjust position;Support pillows;Position options;Expressed milk;Coconut oil;Hand pump;Education;Pace feeding;Infant Driven Feeding Algorithm education ? ?Discharge ?Pump: Personal;Manual ? ?Consult Status ?Consult Status: Follow-up ?Date: 01/11/22 ?Follow-up type: In-patient ? ? ? ?Marvel Mcphillips  Nicholson-Springer ?01/10/2022, 3:34 PM ? ? ? ?

## 2022-01-11 MED ORDER — ACETAMINOPHEN 500 MG PO TABS: 1000.0000 mg | ORAL_TABLET | Freq: Four times a day (QID) | ORAL | 0 refills | Status: AC

## 2022-01-11 MED ORDER — ONDANSETRON HCL 4 MG PO TABS: 4.0000 mg | ORAL_TABLET | Freq: Three times a day (TID) | ORAL | Status: DC | PRN

## 2022-01-11 MED ORDER — SERTRALINE HCL 25 MG PO TABS
25.0000 mg | ORAL_TABLET | Freq: Every day | ORAL | 2 refills | Status: AC
Start: 2022-01-11 — End: 2022-04-11

## 2022-01-11 MED ORDER — OXYCODONE-ACETAMINOPHEN 5-325 MG PO TABS: 1.0000 | ORAL_TABLET | Freq: Four times a day (QID) | ORAL | 0 refills | Status: AC | PRN

## 2022-01-11 MED ORDER — IBUPROFEN 800 MG PO TABS: 800.0000 mg | ORAL_TABLET | Freq: Three times a day (TID) | ORAL | 0 refills | Status: AC

## 2022-01-11 MED ORDER — GABAPENTIN 100 MG PO CAPS: 100.0000 mg | ORAL_CAPSULE | Freq: Three times a day (TID) | ORAL | 0 refills | Status: AC

## 2022-01-11 NOTE — Discharge Summary (Signed)
Postpartum Discharge Summary  Date of Service updated 01/11/22     Patient Name: Beth Bray DOB: 09/22/1995 MRN: [id number]  Date of admission: 01/08/2022 Delivery date:01/08/2022  Delivering provider: Everett Graff  Date of discharge: 01/11/2022  Admitting diagnosis: Encounter for maternal care for low transverse scar from repeat cesarean delivery [O34.211] Status post repeat low transverse cesarean section [Z98.891] Intrauterine pregnancy: [redacted]w[redacted]d    Secondary diagnosis:  Principal Problem:   Encounter for maternal care for low transverse scar from repeat cesarean delivery Active Problems:   Acute blood loss anemia   Status post repeat low transverse cesarean section  Additional problems:  Patient Active Problem List   Diagnosis Date Noted   Status post repeat low transverse cesarean section 01/08/2022   Acute blood loss anemia 10/31/2020   Encounter for maternal care for low transverse scar from repeat cesarean delivery 10/29/2020       Discharge diagnosis: Term Pregnancy Delivered and Acute blood loss anemia                                               Post partum procedures: IV iron infusion Augmentation: N/A Complications: None  Hospital course: Scheduled C/S   26y.o. yo G3P3003 at 318w6das admitted to the hospital 01/08/2022 for scheduled cesarean section with the following indication:Elective Repeat.Delivery details are as follows:  Membrane Rupture Time/Date: 10:19 AM ,01/08/2022   Delivery Method:C-Section, Low Transverse  Details of operation can be found in separate operative note.  Patient had an uncomplicated postpartum course.  She is ambulating, tolerating a regular diet, passing flatus, and urinating well. Patient is discharged home in stable condition on  01/11/22        Newborn Data: Birth date:01/08/2022  Birth time:10:20 AM  Gender:Female  Living status:Living  Apgars:7 ,9  Weight:3280 g     Magnesium Sulfate received: No BMZ received:  No Rhophylac:N/A MMR:N/A Transfusion:No  Physical exam  Vitals:   01/09/22 2100 01/10/22 0450 01/10/22 2040 01/11/22 0521  BP: 108/65 (!) 96/55 112/65 125/71  Pulse: 87 90 80 85  Resp: _0 Temp: 97.8 F (36.6 C) 97.7 F (36.5 C) 98.4 F (36.9 C)   TempSrc: Oral Oral Oral   SpO2: 100% 99%    Weight:      Height:       General: alert, cooperative, and no distress Lochia: appropriate Uterine Fundus: firm Incision: Dressing is clean, dry, and intact DVT Evaluation: No evidence of DVT seen on physical exam. No cords or calf tenderness. No significant calf/ankle edema. Labs: Lab Results  Component Value Date   WBC 13.3 (H) 01/09/2022   HGB 7.9 (L) 01/09/2022   HCT 24.5 (L) 01/09/2022   MCV 78.3 (L) 01/09/2022   PLT 204 01/09/2022      Latest Ref Rng & Units 01/06/2022   10:26 AM  CMP  Glucose 70 - 99 mg/dL 113    BUN 6 - 20 mg/dL <5    Creatinine 0.44 - 1.00 mg/dL 0.65    Sodium 135 - 145 mmol/L 137    Potassium 3.5 - 5.1 mmol/L 4.1    Chloride 98 - 111 mmol/L 109    CO2 22 - 32 mmol/L 21    Calcium 8.9 - 10.3 mg/dL 8.7     Edinburgh Score:    01/11/2022  8:05 AM  Edinburgh Postnatal Depression Scale Screening Tool  I have been able to laugh and see the funny side of things. 1  I have looked forward with enjoyment to things. 2  I have blamed myself unnecessarily when things went wrong. 3  I have been anxious or worried for no good reason. 2  I have felt scared or panicky for no good reason. 2  Things have been getting on top of me. 2  I have been so unhappy that I have had difficulty sleeping. 3  I have felt sad or miserable. 3  I have been so unhappy that I have been crying. 3  The thought of harming myself has occurred to me. 0  Edinburgh Postnatal Depression Scale Total 21      After visit meds:  Allergies as of 01/11/2022       Reactions   Kiwi Extract Swelling   Pineapple Other (See Comments)   Bumps and sores in mouth         Medication List     TAKE these medications    acetaminophen 500 MG tablet Commonly known as: TYLENOL Take 2 tablets (1,000 mg total) by mouth every 6 (six) hours. What changed:  how much to take when to take this reasons to take this   albuterol 108 (90 Base) MCG/ACT inhaler Commonly known as: VENTOLIN HFA Inhale 3 puffs into the lungs every 4 (four) hours as needed for wheezing. What changed: how much to take   cyclobenzaprine 10 MG tablet Commonly known as: FLEXERIL Take 10 mg by mouth at bedtime as needed for muscle spasms.   gabapentin 100 MG capsule Commonly known as: NEURONTIN Take 1 capsule (100 mg total) by mouth 3 (three) times daily for 10 days.   hydrOXYzine 25 MG capsule Commonly known as: VISTARIL Take 25 mg by mouth 3 (three) times daily.   ibuprofen 800 MG tablet Commonly known as: ADVIL Take 1 tablet (800 mg total) by mouth every 8 (eight) hours.   multivitamin-prenatal 27-0.8 MG Tabs tablet Take 1 tablet by mouth daily.   oxyCODONE-acetaminophen 5-325 MG tablet Commonly known as: PERCOCET/ROXICET Take 1 tablet by mouth every 6 (six) hours as needed for up to 5 days for severe pain.   sertraline 25 MG tablet Commonly known as: ZOLOFT Take 1 tablet (25 mg total) by mouth at bedtime.               Discharge Care Instructions  (From admission, onward)           Start     Ordered   01/11/22 0000  Discharge wound care:       Comments: Take dressing off on day 5. Report increased drainage, redness or warmth. Clean with water, let soap trickle down body. Can leave steri strips on until they fall off or take them off gently at day 10. Keep open to air, clean and dry.   01/11/22 0100             Discharge home in stable condition Infant Feeding: Breast Infant Disposition:home with mother Discharge instruction: per After Visit Summary and Postpartum booklet. Activity: Advance as tolerated. Pelvic rest for 6 weeks.  Diet: routine  diet Anticipated Birth Control: IUD Postpartum Appointment:6 weeks Additional Postpartum F/U: Postpartum Depression checkup in 2 weeks Future Appointments:No future appointments. Follow up Visit:  Follow-up Information     Everett Graff, MD. Go in 6 week(s).   Specialty: Obstetrics and Gynecology Contact information: [place]  [address] [place] [address] [phone]                     01/11/2022 Arrie Eastern, CNM

## 2022-01-11 NOTE — Progress Notes (Signed)
Pt Edinburgh Postnatal depression score 21. Order placed per protocol for transition of care. Burman Foster CNM notified. No further orders received. Will continue to monitor pt. ?

## 2022-01-11 NOTE — Social Work (Signed)
CSW acknowledged consult and completed a clinical assessment.  There are no barriers to d/c. ? ?Clinical assessment notes will be entered at a later time. ? ?Vivi Barrack, MSW, LCSW ?[facility]  ?Clinical Social Worker  ?[phone] ?01/11/2022  9:27 AM  ?

## 2022-01-11 NOTE — Social Work (Addendum)
CSW received consult for Edinburgh  21.  CSW met with MOB to offer support and complete assessment.   ? ?CSW met with MOB at bedside and introduced CSW role. CSW observed MOB up in the room holding the infant and on a call with her mom. MOB gave CSW permission share all information in front of her mom.  MOB reported that her mom is aware of her mental health and is her primary support. MOB presented calm and welcomed CSW visit. CSW inquired how MOB has felt since giving birth. MOB reported that she has been feeling sluggish and in pain. MOB shared that she has been breastfeeding the baby and that her "boobs are hurting" because the infant has a "hard suck." CSW encouraged MOB to share this with her nurse and lactation for further support. MOB reported that she had a planned c-section that went well. MOB reported for the past seven day she has been anxious, worried, and unable to sleep because she was worried about the c-section. MOB reported she knew that she would not have a support person present since her mom is watching her daughter and her is son is with his father. MOB reported she got through it with the support of the "nice and caring nurses." CSW inquired how MOB felt during the pregnancy. MOB reported that she felt sad throughout the entire pregnancy and had panic attacks every day, sometimes two a day. MOB reported that she worries about how life will be with three children and does not like to be a burden on her mom even though her mom wants to help. MOB reflected on how her mom's support has been very useful with helping to care or her children. MOB reported that she was prescribed Vistaril for symptoms which she feels is "somewhat helpful for the anxiety.? MOB reported that she is still has depression symptoms. MOB reported that she discussed taking Zoloft with her OB provider however she was told that they did not recommend her taking the Zoloft since she is breastfeeding. CSW encouraged MOB to talk  with her provider again about medication for depression/anxiety since she is open to taking them for relief. MOB reported that she has history of anxiety and depression and was diagnosed in 2016-2017. MOB reported she saw a therapist in the past and felt it was helpful. MOB reported that she plans to see the therapist that her mom sees, "[facility] [address]."  CSW encouraged MOB to reach out to the office soon to schedule an appointment for services. MOB was agreeable. 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 MOB reported that with her last child she felt PPD immediately after giving birth. MOB reported that she has been up out the bed walking around so that she can stay busy instead of lying in the bed which make her feel better. CSW acknowledged MOB efforts. CSW assessed MOB for safety. MOB denied thoughts of harm to self and others. MOB denied domestic violence concerns.  ? ?CSW provided education regarding the baby blues period vs. perinatal mood disorders, discussed treatment and gave resources for mental health follow up if concerns arise.  CSW recommended MOB complete a self-evaluation during the postpartum time period using the New Mom Checklist from Postpartum Progress and encouraged MOB to contact a medical professional if symptoms are noted at any time.   ? ?MOB reported that she has items for   the infant such as the car seat, bassinet and can use assistance with more diapers and wipes. CSW provided review of Sudden Infant Death Syndrome (SIDS) precautions. MOB has chosen [facility]. MO reported that she has transportation to the appointments MOB reported that she receives WIC/FS. CSW discussed [facility]. MOB gave CSW permission to make a referral.  ? ?CSW identifies no further need for intervention and no barriers to discharge at this time.  ? ?Beth Bray,  MSW, LCSW ?[facility]  ?Clinical Social Worker  ?[phone] ?01/11/2022  1:49 PM  ?

## 2022-01-16 ENCOUNTER — Telehealth (HOSPITAL_COMMUNITY): Payer: Self-pay | Admitting: *Deleted

## 2022-01-16 NOTE — Telephone Encounter (Signed)
Attempted hospital discharge follow-up call. Left message for patient to return RN call. EPDS in hospital on 01/11/22 was 21. Hospital score faxed to delivering provider, Osborn Coho. Deforest Hoyles, RN, 01/16/22, [phone] ?

## 2022-03-15 ENCOUNTER — Encounter (HOSPITAL_COMMUNITY): Payer: Self-pay | Admitting: Obstetrics and Gynecology

## 2022-03-17 ENCOUNTER — Encounter (HOSPITAL_COMMUNITY): Payer: Self-pay | Admitting: Obstetrics and Gynecology

## 2022-06-17 IMAGING — US US OB COMP LESS 14 WK
1 series · 15 of 28 positions shown · non-contrast
Comparison: OB ultrasound 10/26/2020.

CLINICAL DATA: 25-year-old pregnant female with history of sharp
abdominal pain.

EXAM:
OBSTETRIC <14 WK ULTRASOUND
TECHNIQUE: Transabdominal ultrasound was performed for evaluation of the
gestation as well as the maternal uterus and adnexal regions.

[Series 1: us ob comp less 14 wk · 15 of 40 slices shown]
[im 1/40]
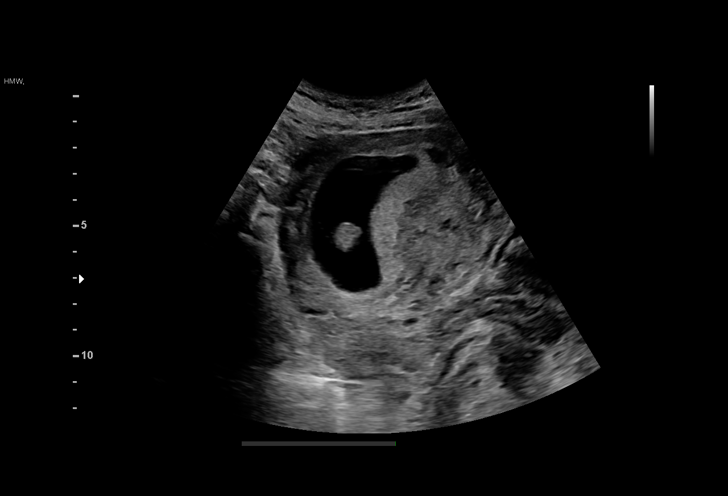
[im 3/40]
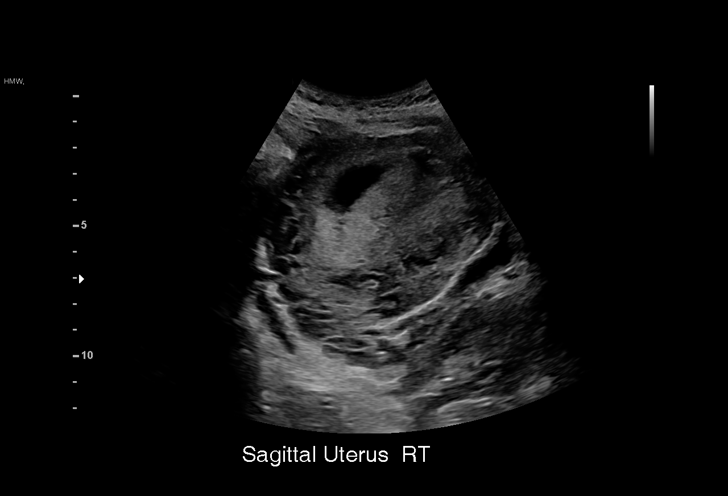
[im 6/40]
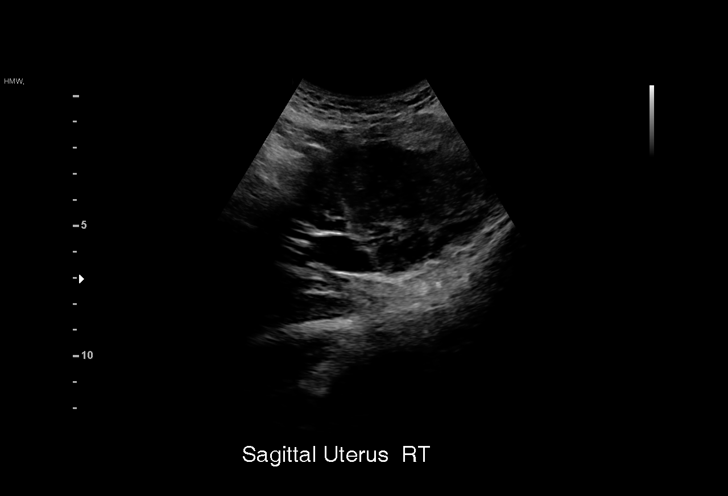
[im 9/40]
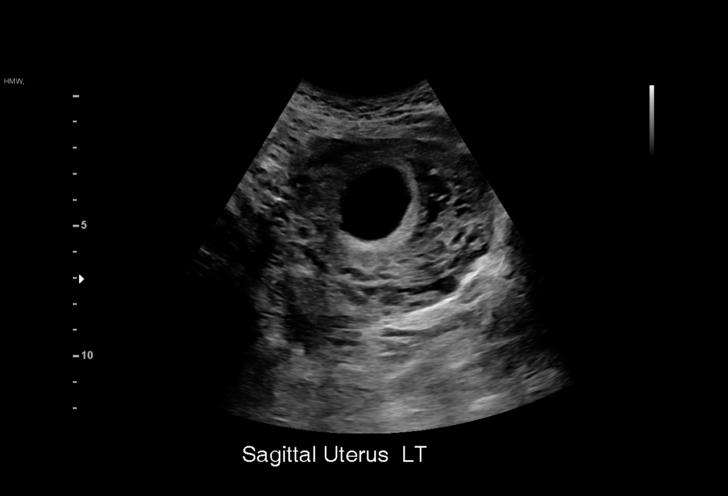
[im 12/40]
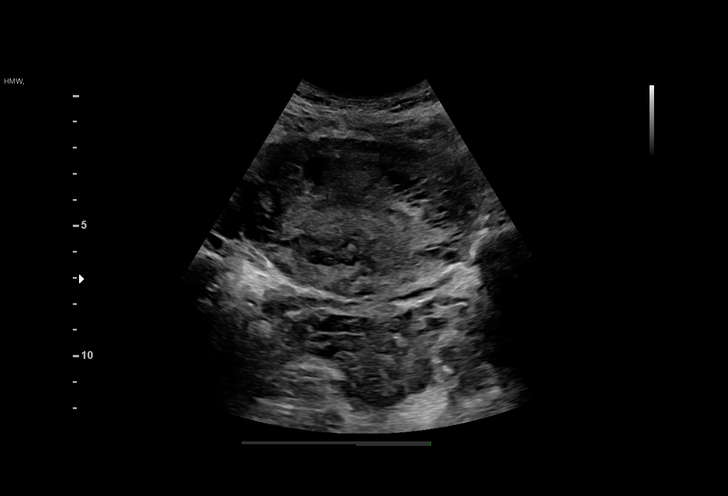
[im 15/40]
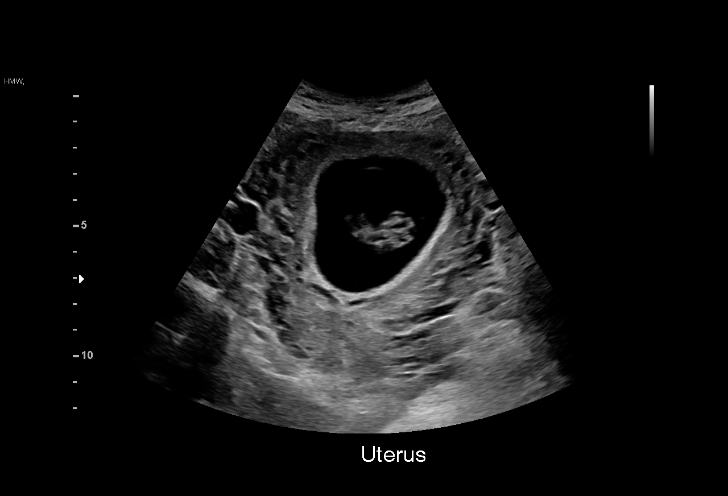
[im 18/40]
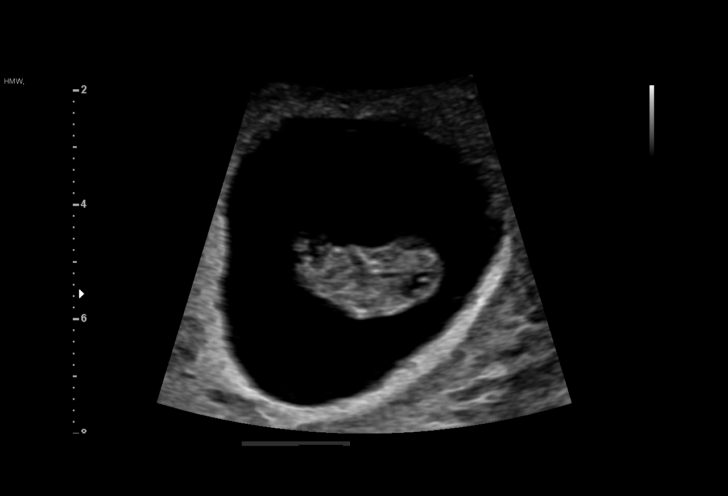
[im 21/40]
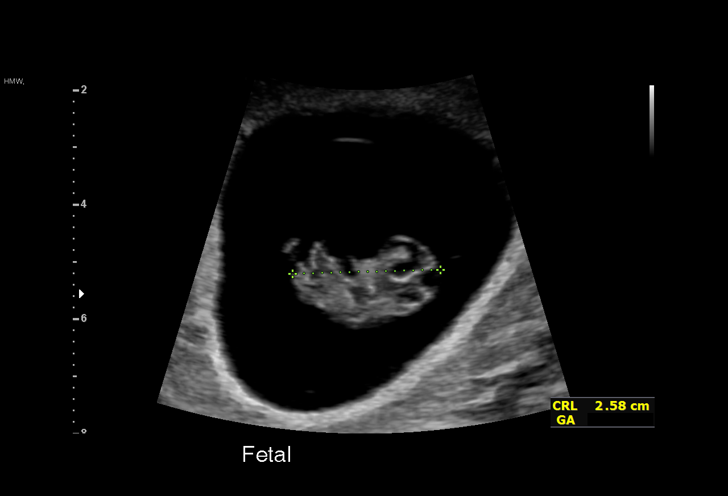
[im 22/40]
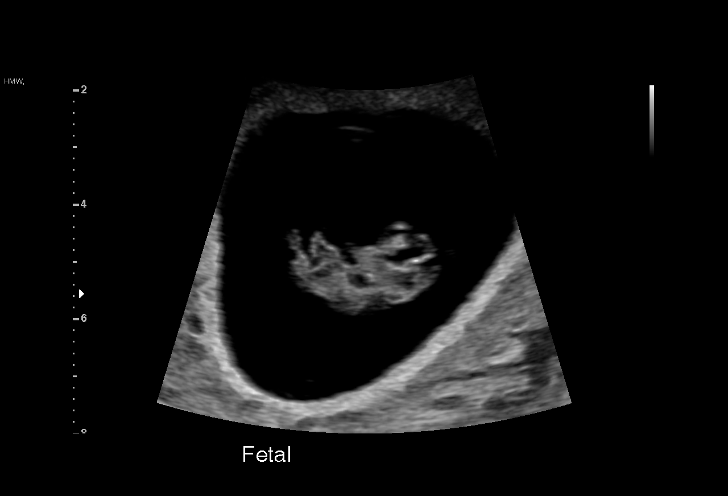
[im 25/40]
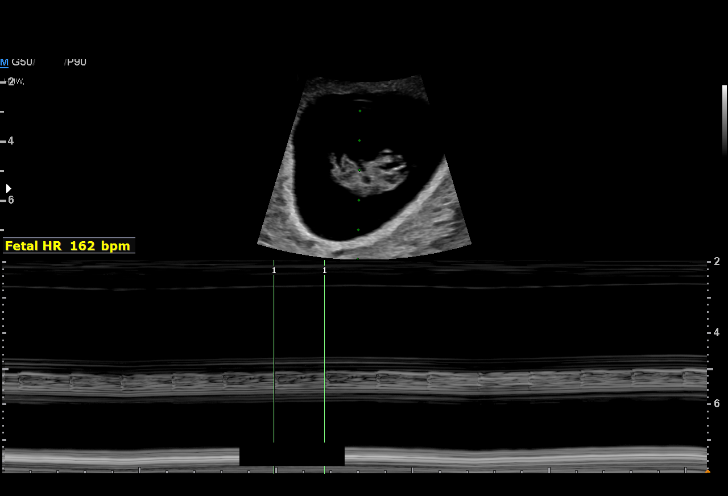
[im 28/40]
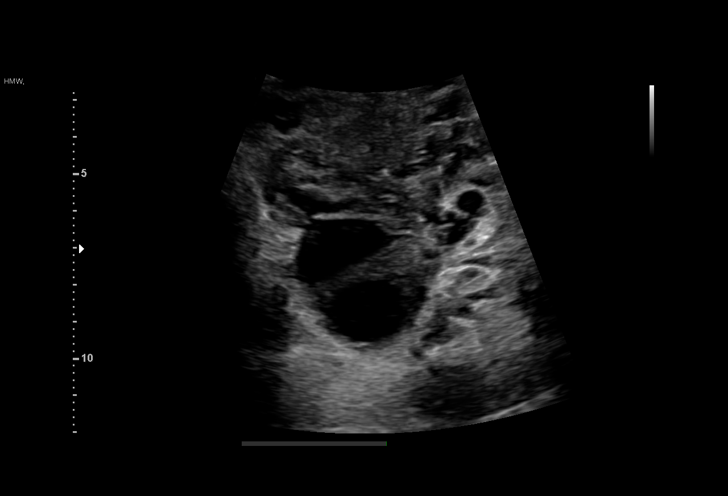
[im 31/40]
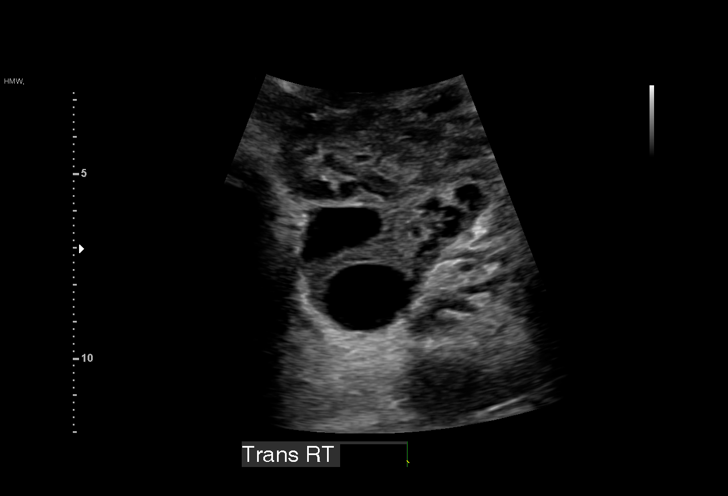
[im 34/40]
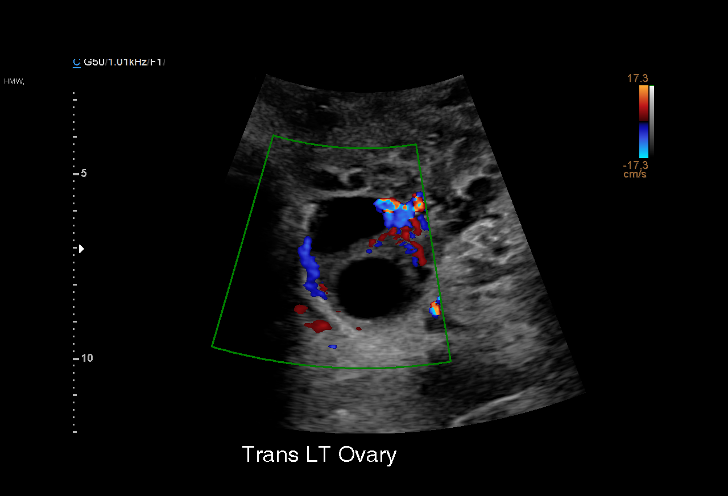
[im 37/40]
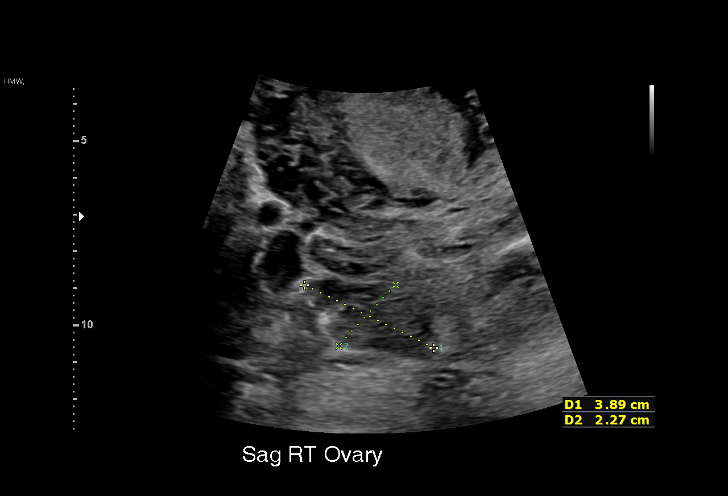
[im 40/40]
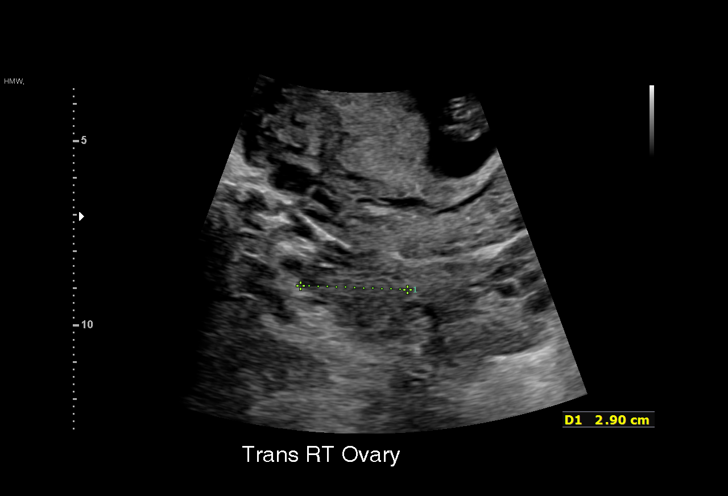

[15 of 28 positions shown; findings below may reference images not displayed]

FINDINGS: Intrauterine gestational sac: Present

Yolk sac:  Present

Embryo:  Present

Cardiac Activity: Present

Heart Rate: 162 bpm

CRL:   25.8 mm   9 w 2 d                  US EDC: 01/16/2022

Subchorionic hemorrhage:  None visualized.

Maternal uterus/adnexae: Bilateral ovaries are normal in appearance.
No significant free fluid in the cul-de-sac.
IMPRESSION: 1. Single viable IUP with estimated gestational age of 9 weeks and 2
days and normal fetal heart rate of 162 beats per minute. No acute
findings.

## 2023-03-14 ENCOUNTER — Ambulatory Visit
Admission: EM | Admit: 2023-03-14 | Discharge: 2023-03-14 | Disposition: A | Discharge status: Home / Self Care | Payer: Medicaid Other | Attending: Internal Medicine | Admitting: Internal Medicine

## 2023-03-14 DIAGNOSIS — K047 Periapical abscess without sinus: Secondary | ICD-10-CM

## 2023-03-14 DIAGNOSIS — K0889 Other specified disorders of teeth and supporting structures: Secondary | ICD-10-CM | POA: Diagnosis not present

## 2023-03-14 MED ORDER — AMOXICILLIN-POT CLAVULANATE 875-125 MG PO TABS: 1.0000 | ORAL_TABLET | Freq: Two times a day (BID) | ORAL | 0 refills | Status: AC

## 2023-03-14 NOTE — Discharge Instructions (Addendum)
I am treating you with an antibiotic for infection.  It is safe with breast-feeding but please keep in mind that children can sometimes develop thrush or diarrhea while breast-feeding if you are taking an antibiotic.  Follow-up with dentist for further evaluation and management.

## 2023-03-14 NOTE — ED Provider Notes (Signed)
[name]    CSN: [id number] Arrival date & time: 03/14/23  1106      History   Chief Complaint Chief Complaint  Patient presents with   Dental Pain    HPI Beth Bray is a 27 y.o. female.   Patient presents with left lower dental pain that started about 2 days ago.  Patient reports that she has a cracked tooth in that area and has some swelling surrounding it.  Denies any fever or purulent drainage.  Has taken Motrin for symptoms.  Reports that she attempted to call a dentist but they are closed for the holiday.   Dental Pain   Past Medical History:  Diagnosis Date   Anxiety    Asthma    Blood transfusion without reported diagnosis 08/12/2018   2u PP   Depression    History of postpartum hemorrhage, currently pregnant     Patient Active Problem List   Diagnosis Date Noted   Status post repeat low transverse cesarean section 01/08/2022   Acute blood loss anemia 10/31/2020   Encounter for maternal care for low transverse scar from repeat cesarean delivery 10/29/2020    Past Surgical History:  Procedure Laterality Date   CESAREAN SECTION N/A 08/12/2018   Procedure: CESAREAN SECTION;  Surgeon: Rives Bing, MD;  Location: [facility];  Service: Obstetrics;  Laterality: N/A;   CESAREAN SECTION N/A 10/29/2020   Procedure: CESAREAN SECTION;  Surgeon: Osborn Coho, MD;  Location: [facility] LD ORS;  Service: Obstetrics;  Laterality: N/A;   CESAREAN SECTION N/A 01/08/2022   Procedure: CESAREAN SECTION;  Surgeon: Osborn Coho, MD;  Location: [facility] LD ORS;  Service: Obstetrics;  Laterality: N/A;   NO PAST SURGERIES     WISDOM TOOTH EXTRACTION      OB History     Gravida  3   Para  3   Term  3   Preterm  0   AB  0   Living  3      SAB  0   IAB  0   Ectopic  0   Multiple  0   Live Births  3            Home Medications    Prior to Admission medications   Medication Sig Start Date End Date Taking? Authorizing Provider   amoxicillin-clavulanate (AUGMENTIN) 875-125 MG tablet Take 1 tablet by mouth every 12 (twelve) hours. 03/14/23  Yes Rael Tilly, Rolly Salter E, FNP  acetaminophen (TYLENOL) 500 MG tablet Take 2 tablets (1,000 mg total) by mouth every 6 (six) hours. 01/11/22   Roma Schanz, CNM  albuterol (PROVENTIL HFA;VENTOLIN HFA) 108 (90 BASE) MCG/ACT inhaler Inhale 3 puffs into the lungs every 4 (four) hours as needed for wheezing. Patient taking differently: Inhale 2 puffs into the lungs every 4 (four) hours as needed for wheezing. 06/05/12   Marcellina Millin, MD  cyclobenzaprine (FLEXERIL) 10 MG tablet Take 10 mg by mouth at bedtime as needed for muscle spasms. 12/06/21   [provider]  gabapentin (NEURONTIN) 100 MG capsule Take 1 capsule (100 mg total) by mouth 3 (three) times daily for 10 days. 01/11/22 01/21/22  Roma Schanz, CNM  hydrOXYzine (VISTARIL) 25 MG capsule Take 25 mg by mouth 3 (three) times daily. 01/03/22   [provider]  ibuprofen (ADVIL) 800 MG tablet Take 1 tablet (800 mg total) by mouth every 8 (eight) hours. 01/11/22   Roma Schanz, CNM  Prenatal Vit-Fe Fumarate-FA (MULTIVITAMIN-PRENATAL) 27-0.8 MG TABS  tablet Take 1 tablet by mouth daily.    [provider]  sertraline (ZOLOFT) 25 MG tablet Take 1 tablet (25 mg total) by mouth at bedtime. 01/11/22 04/11/22  Roma Schanz, CNM    Family History Family History  Problem Relation Age of Onset   Hypertension Mother    Heart disease Mother    Thyroid disease Mother     Social History Social History   Tobacco Use   Smoking status: Passive Smoke Exposure - Never Smoker   Smokeless tobacco: Never  Vaping Use   Vaping Use: Never used  Substance Use Topics   Alcohol use: No   Drug use: No     Allergies   Kiwi extract and Pineapple   Review of Systems Review of Systems Per HPI  Physical Exam Triage Vital Signs ED Triage Vitals [03/14/23 1117]  Enc Vitals Group     BP (!) 94/56     Pulse Rate 92     Resp 18      Temp 98.4 F (36.9 C)     Temp Source Oral     SpO2 97 %     Weight      Height      Head Circumference      Peak Flow      Pain Score 8     Pain Loc      Pain Edu?      Excl. in GC?    No data found.  Updated Vital Signs BP 99/63 (BP Location: Right Arm)   Pulse 92   Temp 98.4 F (36.9 C) (Oral)   Resp 18   LMP 02/24/2023   SpO2 97%   Breastfeeding Yes   Visual Acuity Right Eye Distance:   Left Eye Distance:   Bilateral Distance:    Right Eye Near:   Left Eye Near:    Bilateral Near:     Physical Exam Constitutional:      General: She is not in acute distress.    Appearance: Normal appearance. She is not toxic-appearing or diaphoretic.  HENT:     Mouth/Throat:     Comments: Patient has abnormal dentition with surrounding mild gingival swelling and erythema present to left lower back dentition. Pulmonary:     Effort: Pulmonary effort is normal.  Neurological:     General: No focal deficit present.     Mental Status: She is alert and oriented to person, place, and time.  Psychiatric:        Mood and Affect: Mood normal.        Behavior: Behavior normal.      UC Treatments / Results  Labs (all labs ordered are listed, but only abnormal results are displayed) Labs Reviewed - No data to display  EKG   Radiology No results found.  Procedures Procedures (including critical care time)  Medications Ordered in UC Medications - No data to display  Initial Impression / Assessment and Plan / UC Course  I have reviewed the triage vital signs and the nursing notes.  Pertinent labs & imaging results that were available during my care of the patient were reviewed by me and considered in my medical decision making (see chart for details).     Physical exam is concerning for dental infection.  Will treat with Augmentin as this is safe with breast-feeding.  Encouraged safe over-the-counter pain relievers with breast-feeding as well.  Advised following up  with dentist once they reopen.  Patient verbalized understanding and  was agreeable with plan. Final Clinical Impressions(s) / UC Diagnoses   Final diagnoses:  Dental infection  Pain, dental     Discharge Instructions      I am treating you with an antibiotic for infection.  It is safe with breast-feeding but please keep in mind that children can sometimes develop thrush or diarrhea while breast-feeding if you are taking an antibiotic.  Follow-up with dentist for further evaluation and management.    ED Prescriptions     Medication Sig Dispense Auth. Provider   amoxicillin-clavulanate (AUGMENTIN) 875-125 MG tablet Take 1 tablet by mouth every 12 (twelve) hours. 14 tablet [place], Acie Fredrickson, [place]      PDMP not reviewed this encounter.   Gustavus Bryant, [place] 03/14/23 1131

## 2023-03-14 NOTE — ED Triage Notes (Signed)
Pt c/o cracked tooth to lt lower, now having swelling, pain, and numbness. States dentist is closed till 7/9. Took motrin with little relief.

## 2023-09-12 ENCOUNTER — Ambulatory Visit
Admission: EM | Admit: 2023-09-12 | Discharge: 2023-09-12 | Disposition: A | Discharge status: Home / Self Care | Payer: Medicaid Other

## 2023-09-12 NOTE — ED Triage Notes (Signed)
 Pt c/o right side facial pain, bilateral ear pain, throat pain, and "chest hurts really bad when I cough". Denies fever- "states I'm in a lot of pain in my head and eyes". She took Motrin this morning

## 2024-07-09 ENCOUNTER — Encounter: Admitting: Obstetrics and Gynecology

## 2024-08-03 ENCOUNTER — Ambulatory Visit: Admission: EM | Admit: 2024-08-03 | Discharge: 2024-08-03 | Disposition: A | Discharge status: Home / Self Care

## 2024-08-03 DIAGNOSIS — R197 Diarrhea, unspecified: Secondary | ICD-10-CM | POA: Diagnosis not present

## 2024-08-03 DIAGNOSIS — R112 Nausea with vomiting, unspecified: Secondary | ICD-10-CM | POA: Diagnosis not present

## 2024-08-03 DIAGNOSIS — A084 Viral intestinal infection, unspecified: Secondary | ICD-10-CM | POA: Diagnosis not present

## 2024-08-03 LAB — POCT URINE DIPSTICK
Bilirubin, UA: NEGATIVE
Blood, UA: NEGATIVE
Glucose, UA: NEGATIVE mg/dL
Leukocytes, UA: NEGATIVE
Nitrite, UA: NEGATIVE
POC PROTEIN,UA: 30 — AB
Spec Grav, UA: 1.025 (ref 1.010–1.025)
Urobilinogen, UA: 1 U/dL
pH, UA: 6 (ref 5.0–8.0)

## 2024-08-03 LAB — POCT URINE PREGNANCY: Preg Test, Ur: NEGATIVE

## 2024-08-03 MED ORDER — ONDANSETRON 4 MG PO TBDP: 4.0000 mg | ORAL_TABLET | Freq: Three times a day (TID) | ORAL | 0 refills | Status: AC | PRN

## 2024-08-03 MED ORDER — ONDANSETRON 4 MG PO TBDP
4.0000 mg | ORAL_TABLET | Freq: Once | ORAL | Status: AC
  Administered 2024-08-03: 4 mg via ORAL

## 2024-08-03 NOTE — ED Provider Notes (Signed)
 [name]    CSN: [id number] Arrival date & time: 08/03/24  0800      History   Chief Complaint Chief Complaint  Patient presents with   Abdominal Pain   Emesis    HPI Beth Bray is a 28 y.o. female.   28 year old female presents urgent care with complaints of nausea, vomiting and diarrhea.  She reports that her symptoms started yesterday.  She had several episodes of vomiting yesterday with the last episode of vomiting last night.  She continues to be very nauseated today and continues to have diarrhea.  She denies any fevers, cough, shortness of breath, chest pain.  She reports that she is also having generalized abdominal pain.  She reports that both of her children have had similar symptoms although they got over the symptoms relatively quickly.  She denies any dysuria or hematuria.  She denies any concern for pregnancy.   Abdominal Pain Associated symptoms: diarrhea, nausea and vomiting   Associated symptoms: no chest pain, no chills, no cough, no dysuria, no fever, no hematuria, no shortness of breath and no sore throat   Emesis Associated symptoms: abdominal pain and diarrhea   Associated symptoms: no arthralgias, no chills, no cough, no fever and no sore throat     Past Medical History:  Diagnosis Date   Anxiety    Asthma    Blood transfusion without reported diagnosis 08/12/2018   2u PP   Depression    History of postpartum hemorrhage, currently pregnant     Patient Active Problem List   Diagnosis Date Noted   Status post repeat low transverse cesarean section 01/08/2022   Acute blood loss anemia 10/31/2020   Encounter for maternal care for low transverse scar from repeat cesarean delivery 10/29/2020   Asthma 02/05/2020   Anxiety 02/05/2020    Past Surgical History:  Procedure Laterality Date   CESAREAN SECTION N/A 08/12/2018   Procedure: CESAREAN SECTION;  Surgeon: Izell Harari, MD;  Location: [facility] BIRTHING SUIT[facility];  Service:  Obstetrics;  Laterality: N/A;   CESAREAN SECTION N/A 10/29/2020   Procedure: CESAREAN SECTION;  Surgeon: Henry Slough, MD;  Location: [facility] LD ORS;  Service: Obstetrics;  Laterality: N/A;   CESAREAN SECTION N/A 01/08/2022   Procedure: CESAREAN SECTION;  Surgeon: Henry Slough, MD;  Location: [facility] LD ORS;  Service: Obstetrics;  Laterality: N/A;   WISDOM TOOTH EXTRACTION      OB History     Gravida  3   Para  3   Term  3   Preterm  0   AB  0   Living  3      SAB  0   IAB  0   Ectopic  0   Multiple      Live Births  3            Home Medications    Prior to Admission medications   Medication Sig Start Date End Date Taking? Authorizing Provider  cromolyn (NASALCROM) 5.2 MG/ACT nasal spray Place 1 spray into both nostrils. 12/26/19  Yes [provider]  fluticasone (FLONASE) 50 MCG/ACT nasal spray Place 1 spray into both nostrils daily. 12/26/19  Yes [provider]  guaiFENesin (MUCINEX) 600 MG 12 hr tablet Take 600 mg by mouth 2 (two) times daily. 03/11/20  Yes [provider]  hydrOXYzine (ATARAX) 25 MG tablet Take 25 mg by mouth daily. 06/28/24  Yes [provider]  ondansetron  (ZOFRAN -ODT) 4 MG disintegrating tablet Take 1 tablet (4 mg  total) by mouth every 8 (eight) hours as needed for nausea or vomiting. 08/03/24  Yes Hildagard Sobecki A, PA-C  predniSONE  (DELTASONE ) 20 MG tablet Take 20 mg by mouth daily with breakfast. 06/23/24  Yes [provider]  Prenatal Vit-Fe Fumarate-FA (PRENATAL VITAMIN PO) Take 1 tablet every day by oral route. 03/11/20  Yes [provider]  SUMAtriptan (IMITREX) 100 MG tablet Take 100 mg by mouth every 2 (two) hours as needed. 06/05/24  Yes [provider]  topiramate (TOPAMAX) 50 MG tablet Take 50 mg by mouth. 06/05/24  Yes [provider]  acetaminophen  (TYLENOL ) 500 MG tablet Take 2 tablets (1,000 mg total) by mouth every 6 (six) hours. 01/11/22   Grice, Vivian B, CNM   albuterol  (PROVENTIL  HFA;VENTOLIN  HFA) 108 (90 BASE) MCG/ACT inhaler Inhale 3 puffs into the lungs every 4 (four) hours as needed for wheezing. Patient taking differently: Inhale 2 puffs into the lungs every 4 (four) hours as needed for wheezing. 06/05/12   Rhae Lye, MD  amitriptyline (ELAVIL) 25 MG tablet Take 25 mg by mouth at bedtime.    [provider]  amoxicillin -clavulanate (AUGMENTIN ) 875-125 MG tablet Take 1 tablet by mouth every 12 (twelve) hours. 03/14/23   Hazen Darryle BRAVO, FNP  cyclobenzaprine  (FLEXERIL ) 10 MG tablet Take 10 mg by mouth at bedtime as needed for muscle spasms. 12/06/21   [provider]  gabapentin  (NEURONTIN ) 100 MG capsule Take 1 capsule (100 mg total) by mouth 3 (three) times daily for 10 days. 01/11/22 01/21/22  Grice, Vivian B, CNM  hydrOXYzine (VISTARIL) 25 MG capsule Take 25 mg by mouth 3 (three) times daily. 01/03/22   [provider]  ibuprofen  (ADVIL ) 800 MG tablet Take 1 tablet (800 mg total) by mouth every 8 (eight) hours. 01/11/22   Grice, Vivian B, CNM  Prenatal Vit-Fe Fumarate-FA (MULTIVITAMIN-PRENATAL) 27-0.8 MG TABS tablet Take 1 tablet by mouth daily.    [provider]  promethazine  (PHENERGAN ) 12.5 MG tablet Take 12.5 mg by mouth every 6 (six) hours as needed.    [provider]  promethazine  (PHENERGAN ) 25 MG suppository Place 25 mg rectally.    [provider]  sertraline  (ZOLOFT ) 25 MG tablet Take 1 tablet (25 mg total) by mouth at bedtime. 01/11/22 04/11/22  Grice, Vivian B, CNM    Family History Family History  Problem Relation Age of Onset   Hypertension Mother    Heart disease Mother    Thyroid disease Mother     Social History Social History   Tobacco Use   Smoking status: Passive Smoke Exposure - Never Smoker   Smokeless tobacco: Never  Vaping Use   Vaping status: Never Used  Substance Use Topics   Alcohol use: No   Drug use: No     Allergies   Kiwi extract, Pineapple, and  Pineapple extract   Review of Systems Review of Systems  Constitutional:  Negative for chills and fever.  HENT:  Negative for ear pain and sore throat.   Eyes:  Negative for pain and visual disturbance.  Respiratory:  Negative for cough and shortness of breath.   Cardiovascular:  Negative for chest pain and palpitations.  Gastrointestinal:  Positive for abdominal pain, diarrhea, nausea and vomiting.  Genitourinary:  Negative for dysuria and hematuria.  Musculoskeletal:  Negative for arthralgias and back pain.  Skin:  Negative for color change and rash.  Neurological:  Negative for seizures and syncope.  All other systems reviewed and are negative.  Physical Exam Triage Vital Signs ED Triage Vitals  Encounter Vitals Group     BP 08/03/24 0816 97/67     Girls Systolic BP Percentile --      Girls Diastolic BP Percentile --      Boys Systolic BP Percentile --      Boys Diastolic BP Percentile --      Pulse Rate 08/03/24 0816 (!) 102     Resp 08/03/24 0816 (!) 102     Temp 08/03/24 0816 98.4 F (36.9 C)     Temp Source 08/03/24 0816 Oral     SpO2 08/03/24 0816 97 %     Weight 08/03/24 0814 171 lb (77.6 kg)     Height 08/03/24 0814 5' 5 (1.651 m)     Head Circumference --      Peak Flow --      Pain Score 08/03/24 0812 8     Pain Loc --      Pain Education --      Exclude from Growth Chart --    No data found.  Updated Vital Signs BP 97/67 (BP Location: Left Arm)   Pulse (!) 102   Temp 98.4 F (36.9 C) (Oral)   Resp (!) 102   Ht 5' 5 (1.651 m)   Wt 171 lb (77.6 kg)   LMP 07/26/2024 (Exact Date)   SpO2 97%   BMI 28.46 kg/m   Visual Acuity Right Eye Distance:   Left Eye Distance:   Bilateral Distance:    Right Eye Near:   Left Eye Near:    Bilateral Near:     Physical Exam Vitals and nursing note reviewed.  Constitutional:      General: She is not in acute distress.    Appearance: She is well-developed.  HENT:     Head: Normocephalic and  atraumatic.     Mouth/Throat:     Mouth: Mucous membranes are moist.  Eyes:     Conjunctiva/sclera: Conjunctivae normal.  Cardiovascular:     Rate and Rhythm: Normal rate and regular rhythm.     Heart sounds: No murmur heard. Pulmonary:     Effort: Pulmonary effort is normal. No respiratory distress.     Breath sounds: Normal breath sounds.  Abdominal:     General: Bowel sounds are normal. There is no distension.     Palpations: Abdomen is soft.     Tenderness: There is generalized abdominal tenderness (Mild). There is no right CVA tenderness, left CVA tenderness, guarding or rebound.  Musculoskeletal:        General: No swelling.     Cervical back: Neck supple.  Skin:    General: Skin is warm and dry.     Capillary Refill: Capillary refill takes less than 2 seconds.  Neurological:     Mental Status: She is alert.  Psychiatric:        Mood and Affect: Mood normal.      UC Treatments / Results  Labs (all labs ordered are listed, but only abnormal results are displayed) Labs Reviewed  POCT URINE DIPSTICK - Abnormal; Notable for the following components:      Result Value   Ketones, POC UA trace (5) (*)    POC PROTEIN,UA =30 (*)    All other components within normal limits  POCT URINE PREGNANCY - Normal    EKG   Radiology No results found.  Procedures Procedures (including critical care time)  Medications Ordered in UC Medications  ondansetron  (ZOFRAN -ODT) disintegrating  tablet 4 mg (has no administration in time range)    Initial Impression / Assessment and Plan / UC Course  I have reviewed the triage vital signs and the nursing notes.  Pertinent labs & imaging results that were available during my care of the patient were reviewed by me and considered in my medical decision making (see chart for details).     Nausea and vomiting, unspecified vomiting type - Plan: POCT urine pregnancy, POCT urine pregnancy  Diarrhea, unspecified type  Viral  gastroenteritis   Symptoms and physical exam findings are most consistent with a viral gastroenteritis.  This many times can take 2 to 3 days to completely resolve.  We have given you a dose of Zofran  today for nausea.  You may take another dose of this around 430 to 5 PM tonight.  May use over-the-counter Imodium A-D for diarrhea. Make sure to stay hydrated by drinking plenty of water  even if you do not feel like taking in solid foods.  If your symptoms persist past 4 days then recommend returning to urgent care.  If you have worsening abdominal pain then recommend going to the emergency room.  Final Clinical Impressions(s) / UC Diagnoses   Final diagnoses:  Nausea and vomiting, unspecified vomiting type  Diarrhea, unspecified type  Viral gastroenteritis     Discharge Instructions      Symptoms and physical exam findings are most consistent with a viral gastroenteritis.  This many times can take 2 to 3 days to completely resolve.  We have given you a dose of Zofran  today for nausea.  You may take another dose of this around 430 to 5 PM tonight.  May use over-the-counter Imodium A-D for diarrhea. Make sure to stay hydrated by drinking plenty of water  even if you do not feel like taking in solid foods.  If your symptoms persist past 4 days then recommend returning to urgent care.  If you have worsening abdominal pain then recommend going to the emergency room.     ED Prescriptions     Medication Sig Dispense Auth. Provider   ondansetron  (ZOFRAN -ODT) 4 MG disintegrating tablet Take 1 tablet (4 mg total) by mouth every 8 (eight) hours as needed for nausea or vomiting. 20 tablet Teresa Almarie LABOR, [place]      PDMP not reviewed this encounter.   Teresa Almarie LABOR, [place] 08/03/24 [phone]

## 2024-08-03 NOTE — ED Triage Notes (Signed)
 Patient reports getting a stomach virus from her kids. Woke up yesterday with vomiting and diarrhea. Last emesis last night (none today). Some stomach pain/ache. Stools loose today. Voids normal. No fever known.

## 2024-08-03 NOTE — Discharge Instructions (Addendum)
 Symptoms and physical exam findings are most consistent with a viral gastroenteritis.  This many times can take 2 to 3 days to completely resolve.  We have given you a dose of Zofran  today for nausea.  You may take another dose of this around 430 to 5 PM tonight.  May use over-the-counter Imodium A-D for diarrhea. Make sure to stay hydrated by drinking plenty of water  even if you do not feel like taking in solid foods.  If your symptoms persist past 4 days then recommend returning to urgent care.  If you have worsening abdominal pain then recommend going to the emergency room.
